# Patient Record
Sex: Male | Born: 1935 | Race: White | Hispanic: No | Smoking: Never smoker
Health system: Southern US, Community
[De-identification: ages and names within clinical notes are randomized; demographics above are authoritative.]

## PROBLEM LIST (undated history)

## (undated) DIAGNOSIS — L309 Dermatitis, unspecified: Secondary | ICD-10-CM

## (undated) DIAGNOSIS — I2510 Atherosclerotic heart disease of native coronary artery without angina pectoris: Secondary | ICD-10-CM

## (undated) DIAGNOSIS — N402 Nodular prostate without lower urinary tract symptoms: Secondary | ICD-10-CM

## (undated) DIAGNOSIS — K439 Ventral hernia without obstruction or gangrene: Secondary | ICD-10-CM

## (undated) DIAGNOSIS — K819 Cholecystitis, unspecified: Secondary | ICD-10-CM

## (undated) DIAGNOSIS — I10 Essential (primary) hypertension: Secondary | ICD-10-CM

## (undated) DIAGNOSIS — Z9861 Coronary angioplasty status: Secondary | ICD-10-CM

## (undated) DIAGNOSIS — E119 Type 2 diabetes mellitus without complications: Secondary | ICD-10-CM

## (undated) DIAGNOSIS — I38 Endocarditis, valve unspecified: Secondary | ICD-10-CM

## (undated) DIAGNOSIS — E785 Hyperlipidemia, unspecified: Secondary | ICD-10-CM

## (undated) DIAGNOSIS — I7090 Unspecified atherosclerosis: Secondary | ICD-10-CM

## (undated) HISTORY — DX: Nodular prostate without lower urinary tract symptoms: N40.2

## (undated) HISTORY — DX: Essential (primary) hypertension: I10

## (undated) HISTORY — DX: Coronary angioplasty status: Z98.61

## (undated) HISTORY — DX: Hyperlipidemia, unspecified: E78.5

## (undated) HISTORY — PX: APPENDECTOMY: SHX54

## (undated) HISTORY — DX: Endocarditis, valve unspecified: I38

## (undated) HISTORY — DX: Ventral hernia without obstruction or gangrene: K43.9

## (undated) HISTORY — DX: Type 2 diabetes mellitus without complications: E11.9

## (undated) HISTORY — PX: CORONARY ANGIOPLASTY WITH STENT PLACEMENT: SHX49

## (undated) HISTORY — DX: Cholecystitis, unspecified: K81.9

## (undated) HISTORY — DX: Dermatitis, unspecified: L30.9

## (undated) HISTORY — DX: Atherosclerotic heart disease of native coronary artery without angina pectoris: I25.10

## (undated) HISTORY — DX: Unspecified atherosclerosis: I70.90

---

## 2003-12-11 HISTORY — PX: CARDIAC CATHETERIZATION: SHX172

## 2004-11-09 HISTORY — PX: LEFT HEART CATH AND CORONARY ANGIOGRAPHY: CATH118249

## 2004-11-09 HISTORY — PX: CORONARY ANGIOPLASTY WITH STENT PLACEMENT: SHX49

## 2004-11-21 ENCOUNTER — Inpatient Hospital Stay (HOSPITAL_COMMUNITY): Admission: RE | Admit: 2004-11-21 | Discharge: 2004-11-24 | Payer: Self-pay | Admitting: Cardiovascular Disease

## 2008-06-06 ENCOUNTER — Inpatient Hospital Stay (HOSPITAL_COMMUNITY): Admission: EM | Admit: 2008-06-06 | Discharge: 2008-06-08 | Payer: Self-pay | Admitting: Emergency Medicine

## 2008-06-07 ENCOUNTER — Ambulatory Visit: Payer: Self-pay | Admitting: Infectious Diseases

## 2008-06-19 DIAGNOSIS — M533 Sacrococcygeal disorders, not elsewhere classified: Secondary | ICD-10-CM | POA: Insufficient documentation

## 2008-07-13 ENCOUNTER — Encounter: Payer: Self-pay | Admitting: Infectious Disease

## 2008-07-16 DIAGNOSIS — M791 Myalgia, unspecified site: Secondary | ICD-10-CM | POA: Insufficient documentation

## 2008-07-20 ENCOUNTER — Ambulatory Visit: Payer: Self-pay | Admitting: Infectious Disease

## 2008-07-20 ENCOUNTER — Other Ambulatory Visit: Admission: RE | Admit: 2008-07-20 | Discharge: 2008-07-20 | Payer: Self-pay | Admitting: Infectious Disease

## 2008-07-20 ENCOUNTER — Encounter: Payer: Self-pay | Admitting: Infectious Disease

## 2008-07-20 DIAGNOSIS — R21 Rash and other nonspecific skin eruption: Secondary | ICD-10-CM | POA: Insufficient documentation

## 2008-07-20 DIAGNOSIS — M064 Inflammatory polyarthropathy: Secondary | ICD-10-CM | POA: Insufficient documentation

## 2008-07-20 DIAGNOSIS — IMO0001 Reserved for inherently not codable concepts without codable children: Secondary | ICD-10-CM | POA: Insufficient documentation

## 2008-07-20 DIAGNOSIS — R509 Fever, unspecified: Secondary | ICD-10-CM

## 2008-07-21 ENCOUNTER — Encounter: Payer: Self-pay | Admitting: Infectious Disease

## 2008-07-22 ENCOUNTER — Ambulatory Visit (HOSPITAL_COMMUNITY): Admission: RE | Admit: 2008-07-22 | Discharge: 2008-07-22 | Payer: Self-pay | Admitting: Infectious Disease

## 2008-07-22 ENCOUNTER — Encounter (INDEPENDENT_AMBULATORY_CARE_PROVIDER_SITE_OTHER): Payer: Self-pay | Admitting: Licensed Clinical Social Worker

## 2008-07-26 ENCOUNTER — Telehealth: Payer: Self-pay | Admitting: Infectious Disease

## 2008-08-02 ENCOUNTER — Telehealth (INDEPENDENT_AMBULATORY_CARE_PROVIDER_SITE_OTHER): Payer: Self-pay | Admitting: *Deleted

## 2008-08-10 ENCOUNTER — Encounter: Payer: Self-pay | Admitting: Infectious Disease

## 2010-02-17 DIAGNOSIS — M129 Arthropathy, unspecified: Secondary | ICD-10-CM | POA: Insufficient documentation

## 2011-01-01 ENCOUNTER — Encounter: Payer: Self-pay | Admitting: Rheumatology

## 2011-01-07 LAB — CONVERTED CEMR LAB
Albumin ELP: 47.9 % — ABNORMAL LOW (ref 55.8–66.1)
Alpha-1-Globulin: 9.3 % — ABNORMAL HIGH (ref 2.9–4.9)
Alpha-2-Globulin: 17.5 % — ABNORMAL HIGH (ref 7.1–11.8)
Angiotensin 1 Converting Enzyme: 9 units/L (ref 9–67)
BUN: 14 mg/dL (ref 6–23)
Basophils Absolute: 0 10*3/uL (ref 0.0–0.1)
Basophils Relative: 0 % (ref 0–1)
Beta Globulin: 6 % (ref 4.7–7.2)
CO2: 26 meq/L (ref 19–32)
CRP: 10.2 mg/dL — ABNORMAL HIGH (ref <0.6)
Calcium: 8.7 mg/dL (ref 8.4–10.5)
Chlamydia, Swab/Urine, PCR: NEGATIVE
Chloride: 101 meq/L (ref 96–112)
Creatinine, Ser: 0.78 mg/dL (ref 0.40–1.50)
EBV NA IgG: 5.08 — ABNORMAL HIGH
EBV VCA IgG: 5.27 — ABNORMAL HIGH
EBV VCA IgM: 0.39
Eosinophils Absolute: 0.2 10*3/uL (ref 0.0–0.7)
Eosinophils Relative: 2 % (ref 0–5)
Ferritin: 818 ng/mL — ABNORMAL HIGH (ref 22–322)
GC Probe Amp, Urine: NEGATIVE
Gamma Globulin: 12.9 % (ref 11.1–18.8)
Glucose, Bld: 116 mg/dL — ABNORMAL HIGH (ref 70–99)
HCT: 34.4 % — ABNORMAL LOW (ref 39.0–52.0)
HCV Ab: NEGATIVE
Hemoglobin: 11.8 g/dL — ABNORMAL LOW (ref 13.0–17.0)
Hep A Total Ab: POSITIVE — AB
Hep B S Ab: NEGATIVE
Hepatitis B Surface Ag: NEGATIVE
LDH: 207 units/L (ref 94–250)
Lymphocytes Relative: 15 % (ref 12–46)
Lymphs Abs: 1.6 10*3/uL (ref 0.7–4.0)
MCHC: 34.3 g/dL (ref 30.0–36.0)
MCV: 85.1 fL (ref 78.0–100.0)
Monocytes Absolute: 1.4 10*3/uL — ABNORMAL HIGH (ref 0.1–1.0)
Monocytes Relative: 13 % — ABNORMAL HIGH (ref 3–12)
Neutro Abs: 7.4 10*3/uL (ref 1.7–7.7)
Neutrophil cytoplasmic antibodies,IgG,serum: <1:20 {titer}
Neutrophils Relative %: 70 % (ref 43–77)
Platelets: 270 10*3/uL (ref 150–400)
Potassium: 3.9 meq/L (ref 3.5–5.3)
RBC: 4.04 M/uL — ABNORMAL LOW (ref 4.22–5.81)
RDW: 13.4 % (ref 11.5–15.5)
Rubella: >500 intl units/mL — ABNORMAL HIGH
Sodium: 143 meq/L (ref 135–145)
Total CK: 26 units/L (ref 7–232)
Total Protein, Serum Electrophoresis: 6.7 g/dL (ref 6.0–8.3)
WBC: 10.5 10*3/uL (ref 4.0–10.5)

## 2011-04-24 NOTE — Discharge Summary (Signed)
Charles Sellers, PIECHOTA NO.:  [id number]   MEDICAL RECORD NO.:  [id number]          PATIENT TYPE:  INP   LOCATION:  1314                         FACILITY:  [facility]   PHYSICIAN:  Isidor Holts, M.D.  DATE OF BIRTH:  August 28, 1932   DATE OF ADMISSION:  06/06/2008  DATE OF DISCHARGE:  06/08/2008                               DISCHARGE SUMMARY   PMD:  Formerly Dr. Ocie Doyne, [place], [place].  The patient is  now unassigned.   DISCHARGE DIAGNOSES:  1. Postinfective polyarthropathy, etiology unclear.  2. Rash secondary to #1 above.  3. Hypertension.  4. Dyslipidemia.  5. History of coronary artery disease, status post percutaneous      transluminal coronary angioplasty and stent.  6. Degenerative joint disease/lumbar radiculopathy.  7. Peripheral neuropathy.   DISCHARGE MEDICATIONS:  1. Plavix 75 mg p.o. daily.  2. Fish on 1200 mg p.o. b.i.d.  3. Vitamin-50 Plus 1 p.o. daily.  4. Zocor 40 mg p.o. nightly.  5. Aspirin 81 mg p.o. daily.  6. Nexium 40 mg p.o. daily.  7. Toprol XL 12.5 mg p.o. daily.  8. Ramipril 7.5 mg p.o. daily.  9. NitroQuick 0.4 mg sublingually p.r.n. every 5 minutes.  10.Motrin 400 mg p.o. t.i.d. with food for 1 week, then p.r.n. t.i.d.  11.Prednisone 40 mg p.o. daily for 10 days, then 30 mg p.o. daily for      10 days, then 20 mg p.o. daily for 10 days, then 10 mg p.o. daily      for 10 days, then 5 mg p.o. daily for 5 days, then stop.   PROCEDURES:  Chest x-ray dated June 06, 2008.  This showed mild  cardiomegaly, no active disease.   CONSULTATIONS:  Dr. Lina Sayre, infectious diseases specialist.   ADMISSION HISTORY:  As in H&P notes of June 06, 2008.  However, in  brief, this is a 77 year old male, with known history of coronary artery  disease status post PTCA/sent 2009, hypertension, dyslipidemia,  DJD/lumbar radiculopathy, peripheral neuropathy, presenting with  polyarthritic pain and increasing difficulty in ambulation  for several  weeks, as well as a blotchy salmon-pink, rash on volar aspect of his  feet, for the past 3 days.  Reportedly he had a tick bite in late May  2009, developed flu-like symptoms in the first week of June 2009, for  which he was given a 2-day course of Levaquin, which he had to stop  because of severe joint pains.  He subsequently underwent a 10-day  course of Doxycycline, prescribed by his primary MD.  Because of  unremitting symptoms, he had seen Dr. Leavy Cella, infectious disease  special[place], and subsequently came to the  emergency department at [facility] June 06, 2008.  He was  therefore admitted for further evaluation, investigation and management.   CLINICAL COURSE:  1. Postinfective polyarthropathy.  For details of presentation, refer      to admission history above.  The patient underwent rheumatologic      workup.  ESR was found to be unimpressive at 48 mm/hr.  Rheumatoid  factor was negative.  Total CK was 20.  TSH was normal at 0.211.      [facility] spotted fever IgM was negative as well as Borrelia      burgdorferi IgM/IgG.  HLA B-27 has been ordered; however, at the      time of this dictation on June 08, 2008, the report was still      pending.  Chest x-ray was unremarkable for acute disease.      Urinalysis was negative.  Blood cultures were done, however      remained negative.  The patient was commenced on NSAID treatment      with Motrin and by June 07, 2008, joint pains had resolved.  As a      matter of fact, by June 08, 2008, the patient is fully ambulant and      very keen to be discharged.  Infectious diseases consultation was      kindly provided by Dr. 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 daily and tapering off over a       period of approximately 6 weeks with continued NSAID treatment.  We      had a discussion with Dr. Orson Aloe, infectious disease      specialist at [facility], [facility], by telephone.  He kindly      faxed over the results of his investigations which were ordered on      June 02, 2008, and these were essentially the same ones that were      done in the hospital here and results were similar, i.e., negative.   1. Hypertension.  The patient remained normotensive during the course      of his hospitalization, on pre-admission antihypertensive      medications.   1. Dyslipidemia.  The patient's lipid profile was as follows:  Total      cholesterol 123, triglycerides 30, HDL 39, LDL 78.  This is      considered an excellent lipid profile.  No changes have been made      to his medications.   1. History of degenerative joint disease/lumbar radiculopathy.  There      were no problems referable to this, during the course of his      hospitalization.   DISPOSITION:  The patient was on June 08, 2008, considered sufficiently  clinically recovered and stable to be discharged.  He was therefore  discharged accordingly.  He is recommended to return to regular duties  on June 14, 2008.   DIET:  Heart-healthy.   ACTIVITY:  As tolerated.  Recommended to increase activity slowly.   FOLLOW-UP INSTRUCTIONS:  The patient has recently terminated his  relationship with his primary MD., Dr. Ocie Doyne of [place], [place], and is in the process of establishing a new primary MD;  however, he does  have a cardiologist, Dr. Alanda Amass, who he will follow up with, until he  establishes a new primary MD.  He has been recommended to see Dr.  Alanda Amass in 1-2 weeks' time and has been instructed to call for an  appointment.  He has verbalized understanding.      Isidor Holts, M.D.  Electronically Signed     CO/MEDQ  D:  06/08/2008  T:  06/08/2008  Job:  [id number]   cc:   Pearletha Furl.  Alanda Amass, M.D.  Fax: [phone]   Alan Mulder, M.D.  Fax: [phone]   Fransisco Hertz, M.D.  Fax: [phone]

## 2011-04-24 NOTE — H&P (Signed)
Charles Sellers, [name] NO.:  [id number]   MEDICAL RECORD NO.:  [id number]          PATIENT TYPE:  EMS   LOCATION:  ED                           FACILITY:  [facility]   PHYSICIAN:  Isidor Holts, M.D.  DATE OF BIRTH:  30-Jun-1932   DATE OF ADMISSION:  06/06/2008  DATE OF DISCHARGE:                              HISTORY & PHYSICAL   PRIMARY MEDICAL DOCTOR:  Dr. Patrecia Pace, [place], [place].  The  patient is now unassigned.   CHIEF COMPLAINT:  Unwell since the first week of June 2009, with  generalized joint pains, increasing difficulty in ambulation, a blotchy  rash on both feet for the past 3 days, tick bite in late May 2009.   HISTORY OF PRESENT ILLNESS:  This is a 77 year old male. For past  medical history, see below.  History is essentially as above.  According  to spouse, who accompanied the patient to the emergency department, she  found a tick on this patient in the last 2 weeks of may.  In the first  week of June, the patient developed a cold.  He saw his primary MD on  May 14, 2008, and was prescribed a one week course of Levaquin, but this  was discontinued two days later, secondary to severe generalized joint  pains.  Five days later, he still had aching pains, mainly in the  wrists, elbows, shoulders and hips, although sore throat had subsided.  He saw his primary MD again, who gave him an injection of steroid  repeated this after four days, and then prescribed a five day course of  Tamiflu as well as a 7-10 day course of Doxycycline, all of which he has  completed.  The patient has had a low-grade pyrexia of between 99  degrees to 100 degrees Fahrenheit.  Joint pains have persisted.  He has  taken to ambulating with a cane, with increasing difficulty.  On May 21, 2008, he saw his primary MD again, and was given another steroid  injection.  This time,  he was commenced on oral prednisone taper, which  he has completed.  On June 6, 24, 2009, the  patient saw Dr. Leavy Cella, an  infectious disease specialist at [facility], [facility] who did  some lab tests, reports of which are still pending.  On June 04, 2008,  the patient noticed a blotchy salmon pink rash on the soles of both  feet, which were nonpruritic, nontender.  Last night he had a  particularly restless night, was unable to sleep and therefore came to  the emergency room today.  The patient denies any sick contacts or  recent travel.  Denies abdominal pain.  Denies vomiting, diarrhea or  cough.   PAST MEDICAL HISTORY:  1. Coronary artery disease, status post cardiac catheterization      November 21, 2008, with PTCA and stent proximal 99% RCA and distal      85% PL; status post cardiac catheterization November 23, 2008, by      Dr. Alanda Amass, cutting balloon LAD and surface stent across  diagonal with PTCA of ostial diagonal.  2. Hypertension.  3. Dyslipidemia.  4. Degenerative joint disease/lumbar radiculopathy.  5. Peripheral neuropathy.   MEDICATION:  1. Plavix 75 mg p.o. daily.  2. Fish oil 1200 mg p.o. b.i.d.  3. Vitamin-50 plus one p.o. daily.  4. Zocor 40 mg p.o. daily at bedtime.  5. Aspirin 81 mg p.o. daily.  6. Nexium 40 mg p.o. daily.  7. Toprol XL 12.5 mg p.o. daily.  8. Ramipril 7.5 mg p.o. daily.  9. NitroQuick 0.4 mg sublingually p.r.n.   ALLERGIES:  1. PENICILLIN.  2. NIASPAN.  3. LEVAQUIN.   REVIEW OF SYSTEMS:  As per HPI and chief complaint, otherwise negative.   SOCIAL HISTORY:  The patient is retired, used to work in Designer, fashion/clothing and  mobile homes.  He is married, has four children.  Nonsmoker, nondrinker.  Has no history of drug abuse.   FAMILY HISTORY:  Noncontributory.   PHYSICAL EXAMINATION:  VITAL SIGNS:  Temperature 98.2, pulse 73 per  minute and regular, respiratory rate 23, BP 136/83 mmHg, pulse oximeter  97% on room air.  The patient did not appear to be in obvious acute  distress, at the time of this evaluation.  As a  matter of fact, he was  sitting up on a couch in the emergency department eating a burger.  He  is communicative.  Not short of breath at rest.  HEENT:  No clinical pallor, no jaundice.  No conjunctival injection.  Throat is clear.  NECK:  Supple.  JVP not seen.  No palpable lymphadenopathy.  No palpable  goiter.  CHEST:  Clinically clear to auscultation.  No wheezes or crackles.  HEART:  Heart sounds 1 and 2 heard, normal, regular, no murmurs.  ABDOMEN:  Abdomen is full, soft, nontender.  Multiple Levester Fresh  spots are noted on skin of anterior abdomen.  No palpable organomegaly  or palpable masses.  Normal bowel sounds.  LOWER EXTREMITIES:  No pitting edema.  Palpable peripheral pulses.  The  patient has a blotchy salmon pink discoloration soles of both feet with  serpiginous margins.  MUSCULOSKELETAL:  The patient has nodular osteoarthritic changes.  Has  full range of motion all joints.  No obvious active synovitis.  CENTRAL NERVOUS SYSTEM:  No focal neurologic deficit on gross  examination.   INVESTIGATION:  CBC - WBC 11.9, neutrophils 82%, hemoglobin 13.1,  hematocrit 38.2, platelets 191.  Electrolytes are 133, potassium 4.2,  chloride 104, CO2 27, BUN 15, creatinine 0.54, glucose 111.  AST 15, ALT  27, alkaline phosphatase 51.  Urinalysis is negative.  ESR 48 mm per  hour.   ASSESSMENT AND PLAN:  1. Polyarthropathy associated with rash and low-grade pyrexia.      Etiology is uncertain.  We shall admit the patient and commence him      on NSAIDs, do septic workup with blood cultures and chest x-ray.      Do [facility] Spotted Fever and Ehrlichia titers.  ESR is      mildly elevated, although this is nonspecific.  We shall do      rheumatoid factor, antinuclear antibody, anti-ds DNA, uric acid      levels, and CK level, as the patient is on a statin.  We shall hold      Zocor for now.  Seek infectious diseases specialist opinion.   1. Hypertension.  This is  controlled.  We shall continue pre-admission      antihypertensive medications.  1. Dyslipidemia.  We shall check lipid profile, as well as TSH.   Note:  The patient would likely benefit from PT/OT.   Further management will depend on clinical course.      Isidor Holts, M.D.  Electronically Signed     CO/MEDQ  D:  06/06/2008  T:  06/06/2008  Job:  [id number]

## 2011-04-27 NOTE — Discharge Summary (Signed)
NAMESHAHEER, Charles Sellers NO.:  [id number]   MEDICAL RECORD NO.:  [id number]          PATIENT TYPE:  OIB   LOCATION:  6533                         FACILITY:  [facility]   PHYSICIAN:  Eber Hong, P.A.DATE OF BIRTH:  11/20/1932   DATE OF ADMISSION:  11/21/2004  DATE OF DISCHARGE:  11/24/2004                                 DISCHARGE SUMMARY   ADMISSION DIAGNOSES:  1.  Chest pain with abnormal Cardiolite.  2.  Hypertension.  3.  Hyperlipidemia.   DISCHARGE DIAGNOSES:  1.  Atypical chest pain with 2 vessel coronary artery disease.  2.  Hypertension.  3.  Hyperlipidemia.   PROCEDURE:  1.  Cardiac catheterization November 21, 2004 with percutaneous transluminal      coronary angioplasty and stenting of a proximal 99% right coronary      artery and a distal 85% PL.  2.  Cardiac catheterization PCI and cutting balloon of the left anterior      descending Cypher stent across the second diagonal and percutaneous      transluminal coronary angioplasty of the ostial diagonal 2 artery      November 23, 2004, Dr. Alanda Amass.   HISTORY OF PRESENT ILLNESS:  The patient is a 77 year old white male who had  no medical problems before but he had 3 episodes of weakness and some  abnormal feelings in his arms. He denied shortness of breath, nausea, or  palpitation. This lead his primary care physician, Dr. Debera Lat to do a  Cardiolite study, which was strongly positive. He was subsequently referred  to Dr. Susa Griffins for evaluation and cardiac catheterization. For  further history and physical, please see dictated note.   PAST MEDICAL HISTORY:  Includes hypercholesterolemia, hypertension, lumbar  radiculopathy, peripheral neuropathy.   SOCIAL HISTORY:  He is married. He has 3 children with 1 adopted. He does  not use alcohol or tobacco.   ALLERGIES:  PENICILLIN.   ADMISSION MEDICATIONS:  Atenolol 25 mg daily, nitroglycerin p.r.n., Zocor 40  mg daily, and aspirin  325 mg daily.   HOSPITAL COURSE:  The patient was admitted and taken to the catheterization  lab on November 21, 2004. At that time, he was found to have multi-vessel  disease. The left anterior descending showed an 85% stenosis over the  diagonal 2 and distal to the diagonal 2 branch. There was a 40% followed by  a 50% stenosis of the first diagonal. The left circumflex showed 100%  occlusion distally with what looks like good collateral flow. The right  coronary artery showed 20% to 30% proximal stenosis. This was followed by a  proximal one third 80%, 70% and 99% sequential stenosis. The patient also  had some moderate disease distally 30% and 40%. He also had an 85% stenosis  of what looks like the PL branch. The patient underwent PCA and then  stenting of his right coronary artery and PL on November 21, 2004. He  tolerated the procedure well. The patient's ejection fraction was calculated  at 55%. He had +1 mitral regurgitation. After completion of the study, it  was  Dr. Kandis Cocking opinion that he should undergo stenting of his left  anterior descending. This was done on November 23, 2004. He had a 90% left  anterior descending, which was decreased to 20% and a stent was placed. This  goes across the diagonal and the diagonal ostial stent was dilated. The  patient tolerated both procedures well. Following a.m., he was stable. He  was mobilized and was subsequently discharged to home.   DISCHARGE MEDICATIONS:  1.  Atenolol 50 mg daily. This was increased from his admission dose of 25      daily.  2.  He was to continue Zocor or Lipitor 20 mg daily.  3.  Aspirin 81 mg daily.  4.  He is given a prescription for p.r.n. sublingual nitroglycerin.  5.  Plavix 75 mg daily.   FOLLOW UP:  He will return to see Dr. Alanda Amass in 2 to 3 weeks. He is to  followup with Dr. Debera Lat, who will follow his lipids and medical  management.   LABORATORY DATA:  Discharge labs shows a hemoglobin of  12.3, hematocrit of  35.7, white count of 7.7, platelets 156,000. Electrolytes are normal. BUN is  6. Creatinine is 0.8. Glucose is 114.   CONDITION ON DISCHARGE:  Improved.   ADDENDUM:  He had a small amount of ecchymosis at the catheterization site  but overall looked good. He was instructed to watch for swelling in the  groin and to call if he has any problems.       WDJ/MEDQ  D:  11/24/2004  T:  11/26/2004  Job:  [id number]   cc:   Dr. Debera Lat

## 2011-04-27 NOTE — Cardiovascular Report (Signed)
Charles Sellers, Charles Sellers                 ACCOUNT NO.:  [id number]   MEDICAL RECORD NO.:  [id number]          PATIENT TYPE:  OIB   LOCATION:  6533                         FACILITY:  [facility]   PHYSICIAN:  Richard A. Alanda Amass, M.D.DATE OF BIRTH:  [birth year]   DATE OF PROCEDURE:  11/23/2004  DATE OF DISCHARGE:                              CARDIAC CATHETERIZATION   PROCEDURES:  1.  Retrograde central aortic catheterization.  2.  Selective left coronary angiography.  3.  Pre and post intracoronary nitroglycerin administration.  4.  Aggrastat bolus plus infusion.  5.  Weight-adjusted heparin.  6.  Intravascular ultrasound interrogation of left anterior descending with      Sci-Med Atlantis Pro intravascular ultrasound system.  7.  Cutting balloon atherectomy of high-grade left anterior descending      stenosis post second diagonal.  8.  Left anterior descending Cypher stenting across second diagonal.  9.  Second diagonal ostial percutaneous transluminal coronary angiography      through stent struts.   DESCRIPTION OF PROCEDURE:  The patient was brought to the second floor CP  laboratory in a post absorptive state after 5 mg of Valium p.o.  premedication.  Heparin was on hold.  The patient was continued on aspirin  and Plavix.  The left groin was prepped and draped in the usual manner.  One  percent Xylocaine was used focal local anesthesia.  The LCFA was entered  with a single anterior puncture using an 18 thin-wall needle and a 6 French  short Diag sidearm sheath was inserted without difficulty.  Diagnostic left  coronary angiography was done with 6 French 4 cm taper JL4 Cordis guiding  catheter pre and post IC nitroglycerin administration.  The patient was  given 4000 units of weight adjusted heparin, given an extra 75 mg of Plavix  and 325 mg of aspirin in the laboratory and during the procedure received a  total of 3 mg of Versed in divided doses for sedation and 400 mcg more of IC  nitroglycerin.  Multiple orthogonal projections were reviewed.  The lesion  was crossed with a 0.014 inch Asahi soft guide wire which was free in the  distal LAD.  The lesion was then IVUSed with an Atlantis Pro IVUS over the  guide wire.  The distal reference vessel beyond the lesion was approximately  3.1 mm in diameter.  The lesion itself was very tight eccentrically calcific  and measured 1.2 x 1.4 mm in diameter.  The proximal reference vessel  measured 4.1 x 4.1 mm in diameter.  The side branch was involved in the flap  extending into the diagonal and there was plaque-like disease just proximal  to the DX2.  The IVUS was then removed.  The LAD was dilated with a 3.0/10  atherectomy Sci-Med cutting balloon at 5-40 and 8-40 throughout the fairly  focal 6 mm LAD lesion and proximal to the diagonal takeoff across the DX2.  There was some plaque shift to the diagonal, but it remained patent.  The  LAD was then stented with a 3.0/13 Cypher DS Cortis stent which was deployed  at 12-40 and post dilated at 14-30.  There was plaque shift across the DX2  so the stent balloon was removed and the DX2 was crossed with a second wire,  which was a 0.014 inch Asahi light guide wire.  Using double wire technique,  the LAD was then dilated with an upgrade 3.5/8 Guidant Power Sail balloon at  12-34 in the proximal mid portion and 7-30 in the mid and distal portion.  The balloon was then removed.  The ostia of the LAD had 90% narrowing  related to plaque shift in the ostia and this was crossed over the  previously placed wire through the LAD stent struts with a 2.5/8 Guidant  Power Sail balloon.  This was positioned across the stenosis through the  struts and dilatation was done at 10-44 and repeat at 10-24 seconds.  The  balloon was pulled back.  Final injections showed excellent result in the  LAD with 85% LAD stenosis beyond DX2 reduced to 0 and the plaque shift to  the ostial LAD of 90% (40-50%  narrowing prior to procedure) was reduced to  less than 20% with good TIMI-3 flow to the large trifurcating DX2 and to the  distal LAD coursing to the apex.  The dilatation system was removed.  Final  ACT with double Aggrastat bolus plus infusion going was 221 seconds.  The  patient was transferred to the holding area for postoperative care in stable  condition.  He tolerated the procedure well.   DISCUSSION:  Please refer to catheterization report and RCA PCI reports of  November 21, 2004.   This 77 year old gentleman has recent onset angina characterized  predominantly by arm discomfort and vague substernal burning.  He was  evaluated by Dr. Patrecia Pace and found to have a strongly positive Cardiolite  prompting referral for evaluation and catheterization.  He was begun on  medications and also has a history of hypertension and early onset AODM.   On November 21, 2004, he underwent Cypher stenting with a 3.5 upgraded with  a 4 balloon of a high-grade 99% mid RCA culprit lesion.  He also underwent  stenting of the high-grade PLA lesion with a 2.5/13 Cypher stent.  He  tolerated that well and was brought back today for his staged procedure of a  known high-grade 85% LAD DX2 bifurcation lesion.   He has had successful Cypher stenting of the LAD and side branch dilatation  of the large DX2 with excellent angiographic result.   We would recommended continued medical therapy of his associated problems,  including AODM, hyperlipidemia and hypertension (nonsmoker).  Medical  followup with Alan Mulder, M.D.   CATHETERIZATION DIAGNOSES:  1.  Atherosclerotic heart disease with recent onset of angina, atypical      presentation.  2.  Early positive Cardiolite by Dr. Patrecia Pace with positive stress test.  3.  Systemic hypertension.  4.  Hyperlipidemia.  5.  Recent onset of possible borderline diabetes.  6.  Hyperlipidemia. 7.  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  8.  Successful drug eluting stenting of mid right coronary artery culprit      lesion and right coronary artery and posterolateral artery on November 21, 2004.  9.  Successful staged percutaneous transluminal coronary angiography of high-      grade left anterior descending disease with Cypher stent and side branch      second  diagonal.  10. Systemic hypertension with normal renal arteries.      Rich   RAW/MEDQ  D:  11/23/2004  T:  11/23/2004  Job:  [id number]   cc:   Alan Mulder, M.D.  [address]  [place]  [place] [address]  Fax: [phone]   Darlin Priestly, MD  [phone] [address]  [place]  [place] [address]  Fax: [phone]   [facility]

## 2011-04-27 NOTE — Cardiovascular Report (Signed)
NAMETAJAI, IHDE NO.:  [id number]   MEDICAL RECORD NO.:  [id number]          PATIENT TYPE:  OIB   LOCATION:  2899                         FACILITY:  [facility]   PHYSICIAN:  Richard A. Alanda Amass, M.D.DATE OF BIRTH:  30-Oct-1932   DATE OF PROCEDURE:  11/21/2004  DATE OF DISCHARGE:                              CARDIAC CATHETERIZATION   PROCEDURE:  Percutaneous coronary intervention with culprit lesion, high-  grade mid-right coronary artery, percutaneous transluminal coronary  angiography and subsequent DES CYPHER stent, and re-dilatation with a 4.0 MM  balloon, DES stent mid-distal posterior lateral artery, percutaneous  transluminal coronary angiography of mid-posterior lateral artery, weight-  adjusted heparin, Aggrastat bolus plus infusion, Plavix 600 mg, additional  aspirin.   CARDIOLOGIST:  Richard A. Alanda Amass, M.D.   INDICATIONS FOR PROCEDURE:  Please refer to the complete cardiac  catheterization report earlier on November 21, 2004.  It was elected to proceed with a PCI and an informed consent was obtained  from the patient and from his family.  The diagnostic cardiac  catheterization in the setting of a recent onset of angina, a strongly  positive Cardiolite for inferior ischemia by the patient's astute primary  physician, Dr. Alan Mulder.  Angiography revealed a 99% sub-total  concentric mid-RCA stenosis with surrounding 70% to 80% and high-grade 75%  and 85% tandem PLA lesions, with no other significant dominant right RCA  stenosis.  In addition, he had an 85% hypo-dense LAD lesion beyond the  diagonal-II, a 50% diagonal-I and a total occlusion with recannulation and  collaterals of the mid-distal circumflex obtuse marginal.  Minimal inferior  wall motion abnormality, otherwise well-preserved LV function with an  ejection fraction of greater than 55%, and no significant mitral  regurgitation.   DESCRIPTION OF PROCEDURE:  The patient was given  a double bolus of Aggrastat  plus infusion.  He was given 600 mg of Plavix in the laboratory, along with  4 additional baby aspirin, and 20 mg of IV Pepcid.  The weight-adjusted  heparin was used, and the ACT was monitored with a total of 6500 units of  heparin given throughout the procedure.  IC nitroglycerin 200 mcg x2 was  given in the right coronary artery.  The right coronary artery was intubated  with a 6-French JR4 Cordis guiding catheter.  The high-grade mid-stenosis  was traversed with a 0.014 inch size soft wire.  The wire was then able to  cross the high-grade mid and distal PLA lesions.  A 2.5/12 mm Cymed Maverick  balloon was used to pre-dilate the mid at 8-30.  The distal and mid-PLA  lesions were then crossed under fluoroscopic control and dilated at 7-30, 9-  29, 10-31, and 9-36.  There was residual stenosis in the distal PLA lesion  that was about 75% with elastic recoil and eccentric, so doing careful  balloon exchange, we elected to stent this with a 2.5/13 Cordis DES CYPHER  stent.  This covered the lesion and was deployed at 12-20, and post-dilated  at 12-11.  Excellent angiographic result of the distal and mid-PLA lesions.  Using an exchange technique, the mid-RCA was then stented with a 3.5/18 mm  CYPHER stent, deployed at 14-33 and post-dilated at 10-32.  The dilatation  system was removed.  The balloon up-graded to a 4.0/15.0 Maverick balloon  within the stent and dilated at 10-32.  The balloon was pulled back and  final injections post-IC nitroglycerin, demonstrated the mid-RCA lesion  reduced from 99% to 0%, with  no dissection and full coverage, intact RV  marginal branch.  The mid-PLA was reduced from 75% to 20% (POBA).  The mid-  distal PLA was reduced from 85% the patient 0%.  There was good TIMI-3 flow  throughout the vessel.  The dilatation system was removed.  The patient  received 2 mg of Nubain during the procedure and tolerated it well.  The  blood  pressure remained stable at approximately  140-150 mmHg on IV  nitroglycerin.  The patient was transferred to the holding area for postoperative care and  eventual sheath removal.  I will continue him on IIbIIIa inhibitor for  approximately 18 hours, aspirin and Plavix.  He is a candidate for staged LAD intervention, and depending upon his  clinical course and outcome, we will plan for that probably in 48 hours or  greater, if he remains stable.   DIAGNOSES:  1.  Arteriosclerotic heart disease, with recent onset of angina, with early      positive Cardiolite inferolateral leads.  2.  Associated high-grade proximal-mid-left anterior descending coronary      artery stenosis.  3.  Old total distal circumflex occlusion with collaterals to distal obtuse      marginal.  4.  Well-preserved left ventricular function.  5.  Hypertension, with normal renal arteries.  6.  Early adult onset diabetes mellitus.  7.  Hyperlipidemia.      Rich   RAW/MEDQ  D:  11/21/2004  T:  11/21/2004  Job:  [id number]   cc:   Alan Mulder, M.D.  [address]  [place]  [place] [address]  Fax: [phone]   CP Laboratory - [facility]   Darlin Priestly, MD  [phone] [address]  [place]  [place] [address]  Fax: [phone]

## 2011-04-27 NOTE — Cardiovascular Report (Signed)
NAMEARIC, JOST NO.:  [id number]   MEDICAL RECORD NO.:  [id number]          PATIENT TYPE:  OIB   LOCATION:  2899                         FACILITY:  [facility]   PHYSICIAN:  Richard A. Alanda Amass, M.D.DATE OF BIRTH:  01-21-1932   DATE OF PROCEDURE:  11/21/2004  DATE OF DISCHARGE:                              CARDIAC CATHETERIZATION   PROCEDURE:  Retrograde aortic catheterization, selective coronary  angiography, with pre and post-intercoronary nitroglycerin administration,  left ventricular angiogram in the right anterior oblique, left anterior  oblique projections, sub-selective left internal mammary artery and right  internal mammary artery, abdominal aortic angiogram, mid-stream PO  projection.   CARDIOLOGIST:  Richard A. Alanda Amass, M.D.   DESCRIPTION OF PROCEDURE:  The patient is brought to the second floor CP  laboratory in the post-absorptive state.  After 5 mg of Valium __________  medication and preoperative laboratory, on 11/21/2004 revealed normal CBC  and differential, normal platelet count, coags, BMP, and baseline  electrocardiogram.  The right groin was prepped and draped in the usual  manner.  Xylocaine 1% was used for local anesthesia.  An informed consent  was obtained from the patient to proceed with a diagnostic procedure.  A coronary angiography was done with a 6-French 4 cm taper, Cordis  diagnostic catheters through a 6-French short Daig side-arm sheath that was  placed in a CFRA under 1% Xylocaine anesthesia via the modified Seldinger  technique with a single anterior puncture.  Nitroglycerin 200 mcg was given  through the left coronary and right coronary catheter, with repeat  injections obtained.  Sub-selective LIMA and RIMA were done with the right  coronary catheter.  Left ventricular angiogram was done in the RAO and LAO  projection at 25 mL, 14 mL per second, and 20 mL at 12 mL per second  respectively.  Pullback pressure in the CA  was obtained and showed no  gradient across the aortic valve.  IV nitroglycerin was begun because of  systemic hypertension.  An electrocardiogram remained stable, with no  significant abnormalities.  The patient was given 2 mg of Versed for  sedation at the beginning of the procedure.  An abdominal aortic angiogram  was done in the mid-stream, PA projection at 25 mL, 20 mL per second with  bilaterally.  The catheter was removed.  The patient tolerated the  diagnostic procedure well.   PRESSURES:  LV:  195/0.  LVEDP:  18 mmHg.  CA:  195/90 mmHg.  There is no gradient across the aortic valve on catheter pullback.  Systolic pressure came down to 160-165 mmHg post IC and IV nitroglycerin.   LEFT VENTRICULAR ANGIOGRAM:  The left ventricular angiogram in the RAO and  LAO projection reveals hypokinesis of the mid-inferior wall, estimated  ejection fraction of approximately 55%, and no mitral regurgitation present.   ABDOMINAL AORTIC ANGIOGRAM:  The abdominal aortic angiogram in the mid-  stream PA projection revealed single normal renal arteries bilaterally.  A  very smooth infrarenal abdominal aorta and common and external iliac  systems.  The hypogastrics were intact bilaterally.  There was normal  SFA/profunda junction bilaterally.  The iliacs were quite tortuous but not  aneurysmal, and there was no aneurysm present.  The right brachiocephalic was extremely tortuous.  The RIMA was intact.  The  left subclavian had minor irregularities and the LIMA was intact.  Both  vertebrals were antegrade.  The main fluoroscopy showed 2+ left and right coronary calcification.   RESULTS:  1.  MAIN LEFT CORONARY ARTERY:  The main left coronary artery was long and      normal.  2.  LEFT ANTERIOR DESCENDING CORONARY ARTERY:  The left anterior descending      coronary artery had 85% hypo-dense concentric stenosis at the junction      of the proximal and mid-portion after the second diagonal branch,  which      was of moderate size.  The remainder of the LAD coursed to the apex of      the heart, was slightly under-filled and had no significant stenosis.      The first diagonal arose before SP-I and had 50% ostial and 50% mid-      lesion in the bifurcating vessel, with good flow.  The second diagonal      branch was of moderate size before the LAD stenosis, bifurcated, and had      no significant stenosis.  There was a moderately-large optional diagonal      that bifurcated with no significant stenosis.  3.  CIRCUMFLEX CORONARY ARTERY:  The circumflex proper gave off a small      atrial and OM-I branch, followed by a small PABG branch.  There was      total occlusion of the mid-circumflex before the distal marginal branch.      There was some antegrade filling via recannulation and also retrograde      filling from the OD to the distal circumflex, which was moderately large      and filled well.  4.  RIGHT CORONARY ARTERY:  The right coronary artery was a large dominant      vessel.  There was 20%-30% narrowing in the proximal third at the bend,      followed by a large bifurcating RV branch.  Just above the midportion      segmentally there was high-grade 70% stenosis with concentric narrowing      down to 99% in the distal portion of the segmental mid-lesion.  There      was TIMI-3 flow in the distal vessel.  There was another 40% and 30% of      the mid and distal third of the RCA, with good residual lumen.  The POA      had 75% and 85% high-grade stenoses in the proximal and mid-portion.      The inferior bifurcation branch had no significant stenosis.  The PDA      bifurcated and had no significant stenosis.   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  He is a nonsmoker and has been  very active.  He has a history of hypertension,  possible pre-diabetes, with  mild elevation of blood sugars, and hyperlipidemia.  He recently had several  episodes of vague substernal pressure with sweating and weakness, and his  last episode occurred while chopping wood over the last week.  He underwent  a treadmill exercise test and a Cardiolite scanning by Dr.  Alan Mulder on November 14, 2004, and November 15, 2004.  He had severe stress-  induced ischemia of the inferolateral wall and septum, with an ejection  fraction of 39%.  He was referred through the courtesy of Dr. Patrecia Pace for  further evaluation.  The patient was recently started on Zocor, atenolol and continued on baby  aspirin.  He has high-grade stenosis of the dominant mid-RCA which appears  amenable to PCI.  He also has disease of the distal PLA which is probably  amenable as well.  This appears to be his culprit lesion at present.  If  these are successful with good result, he would be a candidate for a staged  intervention of his LAD lesion beyond the second diagonal.  He has good  preservation of LV function, and no contraindications to antiplatelet  therapy.   CATHETERIZATION DIAGNOSES:  1.  Arteriosclerotic heart disease - recent onset angina of approximately      one to four weeks.  2.  Strongly positive Cardiolite on November 14, 2004, November 15, 2004, Dr.      Patrecia Pace.  3.  Hypertension.  4.  Hyperlipidemia.  5.  Lumbar radiculopathy with history of peripheral neuropathy.  6.  Systemic hypertension with normal renal arteries and renal function.  7.  Possible early adult onset diabetes mellitus.      Rich   RAW/MEDQ  D:  11/21/2004  T:  11/21/2004  Job:  [id number]   cc:   Alan Mulder, M.D.  [address]  [place]  [place] [address]  Fax: [phone]   Darlin Priestly, MD  [phone] [address]  [place]  [place] [address]  Fax: [phone]

## 2011-09-06 LAB — BASIC METABOLIC PANEL
BUN: 19
CO2: 29
Calcium: 8.3 — ABNORMAL LOW
Calcium: 8.4
Creatinine, Ser: 0.71
GFR calc Af Amer: >60
GFR calc non Af Amer: >60
Glucose, Bld: 131 — ABNORMAL HIGH
Sodium: 135

## 2011-09-06 LAB — CBC
HCT: 38.2 — ABNORMAL LOW
Hemoglobin: 12.6 — ABNORMAL LOW
Hemoglobin: 13.1
MCHC: 33.8
MCHC: 34.1
MCV: 87.5
Platelets: 191
RBC: 4.37
RDW: 12.8
RDW: 13.2
WBC: 11.9 — ABNORMAL HIGH

## 2011-09-06 LAB — COMPREHENSIVE METABOLIC PANEL
ALT: 27
AST: 15
Albumin: 2.7 — ABNORMAL LOW
Alkaline Phosphatase: 51
BUN: 15
CO2: 27
Calcium: 8.4
Chloride: 104
Creatinine, Ser: 0.54
GFR calc Af Amer: >60
GFR calc non Af Amer: >60
Glucose, Bld: 111 — ABNORMAL HIGH
Potassium: 4.2
Sodium: 138
Total Bilirubin: 1.1
Total Protein: 5.9 — ABNORMAL LOW

## 2011-09-06 LAB — LIPID PANEL
Cholesterol: 123
HDL: 39 — ABNORMAL LOW
LDL Cholesterol: 78
Total CHOL/HDL Ratio: 3.2
Triglycerides: 30
VLDL: 6

## 2011-09-06 LAB — CULTURE, BLOOD (ROUTINE X 2)
Culture: NO GROWTH
Culture: NO GROWTH

## 2011-09-06 LAB — EHRLICHIA ANTIBODY PANEL: E chaffeensis (HGE) Ab, IgG: 1:64 {titer} — ABNORMAL HIGH

## 2011-09-06 LAB — B. BURGDORFI ANTIBODIES: B burgdorferi Ab IgG+IgM: 0.17

## 2011-09-06 LAB — DIFFERENTIAL
Basophils Absolute: 0
Basophils Relative: 0
Eosinophils Absolute: 0.1
Eosinophils Relative: 1
Lymphocytes Relative: 9 — ABNORMAL LOW
Lymphs Abs: 1.1
Monocytes Absolute: 0.9
Monocytes Relative: 7
Neutro Abs: 9.8 — ABNORMAL HIGH
Neutrophils Relative %: 82 — ABNORMAL HIGH

## 2011-09-06 LAB — CK: Total CK: 20

## 2011-09-06 LAB — ANTI-DNA ANTIBODY, DOUBLE-STRANDED: ds DNA Ab: 2 IU/mL (ref <5)

## 2011-09-06 LAB — URINALYSIS, ROUTINE W REFLEX MICROSCOPIC
Glucose, UA: NEGATIVE
Hgb urine dipstick: NEGATIVE
Ketones, ur: NEGATIVE
Nitrite: NEGATIVE
Protein, ur: NEGATIVE
Specific Gravity, Urine: 1.026
Urobilinogen, UA: 1
pH: 6.5

## 2011-09-06 LAB — TSH: TSH: 0.211 — ABNORMAL LOW

## 2011-09-06 LAB — SEDIMENTATION RATE: Sed Rate: 48 — ABNORMAL HIGH

## 2011-09-06 LAB — ROCKY MTN SPOTTED FVR AB, IGM-BLOOD: RMSF IgM: 0.28 IV

## 2011-09-06 LAB — ROCKY MTN SPOTTED FVR AB, IGG-BLOOD: RMSF IgG: <1:64 {titer}

## 2011-09-06 LAB — RHEUMATOID FACTOR: Rheumatoid fact SerPl-aCnc: <20

## 2011-09-06 LAB — ANA: Anti Nuclear Antibody(ANA): NEGATIVE

## 2011-12-11 HISTORY — PX: OTHER SURGICAL HISTORY: SHX169

## 2012-06-09 HISTORY — PX: NM MYOVIEW LTD: HXRAD82

## 2012-12-19 ENCOUNTER — Other Ambulatory Visit (HOSPITAL_COMMUNITY): Payer: Self-pay | Admitting: Cardiovascular Disease

## 2012-12-19 DIAGNOSIS — I2510 Atherosclerotic heart disease of native coronary artery without angina pectoris: Secondary | ICD-10-CM

## 2012-12-19 DIAGNOSIS — R0989 Other specified symptoms and signs involving the circulatory and respiratory systems: Secondary | ICD-10-CM

## 2012-12-19 DIAGNOSIS — I10 Essential (primary) hypertension: Secondary | ICD-10-CM

## 2012-12-19 DIAGNOSIS — R9431 Abnormal electrocardiogram [ECG] [EKG]: Secondary | ICD-10-CM

## 2013-01-10 HISTORY — PX: TRANSTHORACIC ECHOCARDIOGRAM: SHX275

## 2013-01-14 ENCOUNTER — Ambulatory Visit (HOSPITAL_COMMUNITY)
Admission: RE | Admit: 2013-01-14 | Discharge: 2013-01-14 | Disposition: A | Discharge status: Home / Self Care | Payer: Medicare Other | Source: Ambulatory Visit | Attending: Cardiovascular Disease | Admitting: Cardiovascular Disease

## 2013-01-14 ENCOUNTER — Encounter (HOSPITAL_COMMUNITY): Payer: Self-pay

## 2013-01-14 DIAGNOSIS — I319 Disease of pericardium, unspecified: Secondary | ICD-10-CM | POA: Insufficient documentation

## 2013-01-14 DIAGNOSIS — R9431 Abnormal electrocardiogram [ECG] [EKG]: Secondary | ICD-10-CM | POA: Insufficient documentation

## 2013-01-14 DIAGNOSIS — R0989 Other specified symptoms and signs involving the circulatory and respiratory systems: Secondary | ICD-10-CM | POA: Insufficient documentation

## 2013-01-14 DIAGNOSIS — I10 Essential (primary) hypertension: Secondary | ICD-10-CM

## 2013-01-14 DIAGNOSIS — I2510 Atherosclerotic heart disease of native coronary artery without angina pectoris: Secondary | ICD-10-CM

## 2013-01-14 DIAGNOSIS — I080 Rheumatic disorders of both mitral and aortic valves: Secondary | ICD-10-CM | POA: Insufficient documentation

## 2013-01-14 DIAGNOSIS — I369 Nonrheumatic tricuspid valve disorder, unspecified: Secondary | ICD-10-CM | POA: Insufficient documentation

## 2013-01-14 NOTE — Progress Notes (Signed)
2D Echo Performed 01/14/2013    Brenlyn Beshara, RCS  

## 2013-01-14 NOTE — Progress Notes (Signed)
Carotid Doppler completed. Charles Sellers

## 2013-06-11 ENCOUNTER — Other Ambulatory Visit: Payer: Self-pay

## 2013-06-11 MED ORDER — METOPROLOL SUCCINATE ER 25 MG PO TB24: 12.5000 mg | ORAL_TABLET | Freq: Every day | ORAL | Status: DC

## 2013-06-11 MED ORDER — CLOPIDOGREL BISULFATE 75 MG PO TABS: 75.0000 mg | ORAL_TABLET | Freq: Every day | ORAL | Status: DC

## 2013-06-11 NOTE — Telephone Encounter (Signed)
Rx was sent to pharmacy electronically via [employer].  

## 2013-08-10 HISTORY — PX: OTHER SURGICAL HISTORY: SHX169

## 2013-09-02 ENCOUNTER — Encounter: Payer: Self-pay | Admitting: Cardiovascular Disease

## 2013-12-02 ENCOUNTER — Ambulatory Visit: Payer: Self-pay | Admitting: Family Medicine

## 2013-12-28 ENCOUNTER — Other Ambulatory Visit: Payer: Self-pay | Admitting: *Deleted

## 2013-12-28 MED ORDER — CLOPIDOGREL BISULFATE 75 MG PO TABS: 75.0000 mg | ORAL_TABLET | Freq: Every day | ORAL | Status: DC

## 2013-12-28 MED ORDER — ROSUVASTATIN CALCIUM 5 MG PO TABS
5.0000 mg | ORAL_TABLET | Freq: Every day | ORAL | Status: DC
Start: 2013-12-28 — End: 2013-12-30

## 2013-12-28 MED ORDER — PANTOPRAZOLE SODIUM 40 MG PO TBEC: 40.0000 mg | DELAYED_RELEASE_TABLET | Freq: Every day | ORAL | Status: DC

## 2013-12-28 MED ORDER — METOPROLOL SUCCINATE ER 25 MG PO TB24: 12.5000 mg | ORAL_TABLET | Freq: Every day | ORAL | Status: DC

## 2013-12-28 MED ORDER — RAMIPRIL 5 MG PO CAPS: 5.0000 mg | ORAL_CAPSULE | Freq: Every day | ORAL | Status: DC

## 2013-12-30 ENCOUNTER — Other Ambulatory Visit: Payer: Self-pay

## 2013-12-30 MED ORDER — ROSUVASTATIN CALCIUM 5 MG PO TABS: 5.0000 mg | ORAL_TABLET | Freq: Every day | ORAL | Status: DC

## 2013-12-30 NOTE — Telephone Encounter (Signed)
Pt would only like a 5 day supply, just until mail order comes

## 2014-02-10 ENCOUNTER — Encounter: Payer: Self-pay | Admitting: *Deleted

## 2014-02-10 ENCOUNTER — Encounter: Payer: Self-pay | Admitting: Cardiology

## 2014-02-10 ENCOUNTER — Ambulatory Visit (INDEPENDENT_AMBULATORY_CARE_PROVIDER_SITE_OTHER): Payer: Commercial Managed Care - HMO | Admitting: Cardiology

## 2014-02-10 VITALS — BP 163/75 | HR 62 | Ht 68.0 in | Wt 178.0 lb

## 2014-02-10 DIAGNOSIS — I152 Hypertension secondary to endocrine disorders: Secondary | ICD-10-CM | POA: Insufficient documentation

## 2014-02-10 DIAGNOSIS — E1169 Type 2 diabetes mellitus with other specified complication: Secondary | ICD-10-CM | POA: Insufficient documentation

## 2014-02-10 DIAGNOSIS — Z9861 Coronary angioplasty status: Secondary | ICD-10-CM

## 2014-02-10 DIAGNOSIS — E785 Hyperlipidemia, unspecified: Secondary | ICD-10-CM

## 2014-02-10 DIAGNOSIS — E1159 Type 2 diabetes mellitus with other circulatory complications: Secondary | ICD-10-CM | POA: Insufficient documentation

## 2014-02-10 DIAGNOSIS — I2510 Atherosclerotic heart disease of native coronary artery without angina pectoris: Secondary | ICD-10-CM | POA: Insufficient documentation

## 2014-02-10 DIAGNOSIS — I10 Essential (primary) hypertension: Secondary | ICD-10-CM

## 2014-02-10 DIAGNOSIS — I25810 Atherosclerosis of coronary artery bypass graft(s) without angina pectoris: Secondary | ICD-10-CM

## 2014-02-10 DIAGNOSIS — E119 Type 2 diabetes mellitus without complications: Secondary | ICD-10-CM

## 2014-02-10 NOTE — Progress Notes (Signed)
PATIENT: Charles Sellers MRN: [id number] DOB: 1932/11/08 PCP: Margarita Rana, MD  Clinic Note: Chief Complaint  Patient presents with  . other    No complaints. Meds reviewed verbally with pt.    HPI: Charles Sellers is a 80 y.o. male with a PMH below who presents today for reestablishment of cardiology care at a retirement of Dr. Terance Ice.  He was last seen in September 2014 and was stable. He has a history of multivessel CAD status post PCI to the LAD, mid RCA and RPL with a known occlusion of the circumflex. This was all 2005 following an abnormal Myoview..  Interval History: Charles Sellers presents today just for routine followup and continues to be very stable overall from a cardiac standpoint. He has not had any of his anginal symptoms since his last PCI. He denies any exertional or resting chest pressure/tightness or dyspnea. No PND, orthopnea or edema. No palpitations, lightheadedness, dizziness, weakness or syncope/near syncope. No TIA/amaurosis fugax symptoms. No melena, hematochezia hematuria.  Past Medical History  Diagnosis Date  . CAD S/P percutaneous coronary angioplasty 12/13 & 15 2005    PCI - mRCA (Cypher DES 3.5 mm x 18 mm --> 4.80mm); RPL Cypher 2.5 mm x 13 mm;; mLAD Cy a pher DES 3.0 mm x 13 mm (3.5 mm) with Cutting PTCA of D2 ostium;; mid Cx 100% after small OM1.  . Diabetes mellitus type II, controlled     Diet controlled. No medications  . Dyslipidemia, goal LDL below 70      on statin  . Hypertension   . Ventral hernia    Prior Cardiac Evaluation and Past Surgical History: Past Surgical History  Procedure Laterality Date  . Coronary angioplasty with stent placement      mRCA Cypher DES 3.5 mm x 18 mm (4.0), RPL Cypher DES 2.5 mm x 13 mm; mLAD 3.0 mm x 13 mm Cypher -> cutting PTCA of jailed D2; previousl PCI of Cx - 100% mid Cx.  Marland Kitchen Appendectomy    . Nm myoview ltd  06/2012    Small area of basal inferior infarct with no ischemia. Normal EF  . Transthoracic  echocardiogram  February 2014     EF 55%. Mild-moderate MR    Allergies  Allergen Reactions  . Penicillins     Current Outpatient Prescriptions  Medication Sig Dispense Refill  . aspirin 81 MG tablet Take 81 mg by mouth daily.      . clopidogrel (PLAVIX) 75 MG tablet Take 1 tablet (75 mg total) by mouth daily.  90 tablet  2  . metoprolol succinate (TOPROL-XL) 25 MG 24 hr tablet Take 0.5 tablets (12.5 mg total) by mouth daily.  90 tablet  2  . Multiple Vitamin (MULTIVITAMIN) tablet Take 1 tablet by mouth daily.      . nitroGLYCERIN (NITROSTAT) 0.4 MG SL tablet Place 0.4 mg under the tongue every 5 (five) minutes as needed for chest pain.      . Omega-3 Fatty Acids (FISH OIL) 1200 MG CAPS Take 2 capsules by mouth daily.      . pantoprazole (PROTONIX) 40 MG tablet Take 1 tablet (40 mg total) by mouth daily.  90 tablet  2  . ramipril (ALTACE) 5 MG capsule Take 1 capsule (5 mg total) by mouth daily.  90 capsule  2  . rosuvastatin (CRESTOR) 5 MG tablet Take 1 tablet (5 mg total) by mouth daily.  7 tablet  0   No current facility-administered medications for this  visit.    History   Social History Narrative   Married father of 3, 9 grandchildren, now 14 great-grandchildren. 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   Using the toilet, but does not do routine exercise. Always on the go.   Never smoked. Does not drink alcohol.    family history includes Liver cancer in his father; Stroke in his mother.  ROS: A comprehensive Review of Systems - Negative except Recent scrape on his right lower leg while moving firewood.  PHYSICAL EXAM BP 163/75  Pulse 62  Ht 5\' 8"  (1.727 m)  Wt 178 lb (80.74 kg)  BMI 27.07 kg/m2 General appearance: alert, cooperative, appears stated age, no distress and Pleasant mood and affect. Well-nourished and well-groomed. Answers questions  appropriately. HEENT: [place]/AT, EOMI, MMM, anicteric sclera Neck: no adenopathy, no carotid bruit, no JVD, supple, symmetrical, trachea midline and thyroid not enlarged, symmetric, no tenderness/mass/nodules Lungs: clear to auscultation bilaterally, normal percussion bilaterally and Nonlabored, good air movement. Heart: normal apical impulse, S1, S2 normal and RRR. Soft 1/6 HSM at left sternal border.; No rubs or gallop Abdomen: soft, non-tender; bowel sounds normal; no masses,  no organomegaly Extremities: extremities normal, atraumatic, no cyanosis or edema and varicose veins noted Pulses: 2+ and symmetric Neurologic: Alert and oriented X 3, normal strength and tone. Normal symmetric reflexes. Normal coordination and gait; CN 2-12 grossly intact  [id number] today: Yes Rate: 62 , Rhythm: NSR; borderline LVH, inferior MI age undetermined. Nonspecific T wave inversions in leads 3 and aVL.    Recent Labs: none since August 2014: Total cholesterol 117, LDL 64, HDL 38.  ASSESSMENT / PLAN: Very pleasant elderly gentleman with stable coronary artery disease and stable risk factors.  CAD S/P percutaneous coronary angioplasty Very stable overall. No recurrent symptoms. Remains on dual antiplatelet therapy which is appropriate based on the number of Cypher stents. He can stop aspirin if need be for significant bleeding episodes. He can also stop Plavix if needed for any surgeries or procedures.  Hypertension His blood pressure is somewhat elevated today. In the past has always been normal. He has been stable on low dose of Toprol plus ramipril. I will continue to monitor. His blood pressure continues to be elevated would consider increasing ACE inhibitor dose. I do suspect that he is somewhat anxious about meeting a cardiologist in a new office.  Dyslipidemia, goal LDL below 70 Relatively well controlled on Crestor. He should have labs checked before seeing back in 6 months. He says that he usually  has been done for his annual physicals. If not we'll get that done next visit. He is well enough controlled that his levels can be rechecked annually.  Diabetes mellitus type II, controlled Currently not on any medications. Stable. Monitored by PCP.    Orders Placed This Encounter  Procedures  . EKG 12-Lead    Order Specific Question:  Where should this test be performed    Answer:  [facility]   Meds ordered this encounter  Medications  . Multiple Vitamin (MULTIVITAMIN) tablet    Sig: Take 1 tablet by mouth daily.  . Omega-3 Fatty Acids (FISH OIL) 1200 MG CAPS    Sig: Take 2 capsules by mouth daily.  Marland Kitchen aspirin 81 MG tablet    Sig: Take 81 mg by mouth daily.  . nitroGLYCERIN (NITROSTAT) 0.4 MG SL tablet    Sig:  Place 0.4 mg under the tongue every 5 (five) minutes as needed for chest pain.    Followup: 6 months will need a referral within 90 days of scheduled appointment  Emonii Wienke W. Ellyn Hack, M.D., M.S. Interventional Cardiolgy [facility]

## 2014-02-10 NOTE — Assessment & Plan Note (Signed)
Relatively well controlled on Crestor. He should have labs checked before seeing back in 6 months. He says that he usually has been done for his annual physicals. If not we'll get that done next visit. He is well enough controlled that his levels can be rechecked annually.

## 2014-02-10 NOTE — Assessment & Plan Note (Signed)
Currently not on any medications. Stable. Monitored by PCP.

## 2014-02-10 NOTE — Patient Instructions (Signed)
Your physician wants you to follow-up in: 6 months  You will receive a reminder letter in the mail two months in advance. If you don't receive a letter, please call our office to schedule the follow-up appointment.  Your physician recommends that you continue on your current medications as directed. Please refer to the Current Medication list given to you today.  

## 2014-02-10 NOTE — Assessment & Plan Note (Addendum)
His blood pressure is somewhat elevated today. In the past has always been normal. He has been stable on low dose of Toprol plus ramipril. I will continue to monitor. His blood pressure continues to be elevated would consider increasing ACE inhibitor dose. I do suspect that he is somewhat anxious about meeting a cardiologist in a new office.

## 2014-02-10 NOTE — Assessment & Plan Note (Signed)
Very stable overall. No recurrent symptoms. Remains on dual antiplatelet therapy which is appropriate based on the number of Cypher stents. He can stop aspirin if need be for significant bleeding episodes. He can also stop Plavix if needed for any surgeries or procedures.

## 2014-02-11 ENCOUNTER — Ambulatory Visit: Payer: Medicare Other | Admitting: Cardiology

## 2014-08-11 ENCOUNTER — Encounter: Payer: Self-pay | Admitting: Cardiology

## 2014-08-11 ENCOUNTER — Ambulatory Visit (INDEPENDENT_AMBULATORY_CARE_PROVIDER_SITE_OTHER): Payer: Commercial Managed Care - HMO | Admitting: Cardiology

## 2014-08-11 VITALS — BP 120/72 | HR 76 | Ht 66.5 in | Wt 177.0 lb

## 2014-08-11 DIAGNOSIS — I2510 Atherosclerotic heart disease of native coronary artery without angina pectoris: Secondary | ICD-10-CM

## 2014-08-11 DIAGNOSIS — I10 Essential (primary) hypertension: Secondary | ICD-10-CM

## 2014-08-11 DIAGNOSIS — E785 Hyperlipidemia, unspecified: Secondary | ICD-10-CM

## 2014-08-11 DIAGNOSIS — Z9861 Coronary angioplasty status: Secondary | ICD-10-CM

## 2014-08-11 DIAGNOSIS — E119 Type 2 diabetes mellitus without complications: Secondary | ICD-10-CM

## 2014-08-11 MED ORDER — CLOPIDOGREL BISULFATE 75 MG PO TABS: 75.0000 mg | ORAL_TABLET | Freq: Every day | ORAL | Status: DC

## 2014-08-11 MED ORDER — ROSUVASTATIN CALCIUM 5 MG PO TABS: 5.0000 mg | ORAL_TABLET | Freq: Every day | ORAL | Status: DC

## 2014-08-11 MED ORDER — RAMIPRIL 5 MG PO CAPS: 5.0000 mg | ORAL_CAPSULE | Freq: Every day | ORAL | Status: DC

## 2014-08-11 MED ORDER — PANTOPRAZOLE SODIUM 40 MG PO TBEC: 40.0000 mg | DELAYED_RELEASE_TABLET | Freq: Every day | ORAL | Status: DC

## 2014-08-11 NOTE — Progress Notes (Signed)
PCP: MALONEY,NANCY, MD  Clinic Note: Chief Complaint  Patient presents with  . other    6 month f/u no complaints. Meds reviewed verbally with pt.    HPI: DAMAURI MINION is a 80 y.o. male with a Cardiovascular Problem List below who presents today for six-month followup of his three-vessel CAD. He is a poor patient of Dr. Terance Ice. I last saw him in March and he was doing very well at that time. He is very active doing various, household jobs including a maintenance, building and painting as well as mowing lawns.  Interval History: He presents today doing very well numerous episodes of having relatively brisk bleeding when cutting himself but otherwise really no symptoms of resting or exertional chest tightness or pressure. As noted he is very active and has not had any symptoms of angina or dyspnea at rest or exertion. No heart failure symptoms of PND, orthopnea or edema. No rapid or irregular heartbeats/palpitations. No syncope or near-syncope, TIA/amaurosis fugax. No melena, hematochezia hematuria or epistaxis but does have brisk using bruising. No significant GERD symptoms. No myalgias or arthralgias. No claudication.  Past Medical History  Diagnosis Date  . CAD S/P percutaneous coronary angioplasty 12/13 & 15 2005    PCI - mRCA (Cypher DES 3.5 mm x 18 mm --> 4.50mm); RPL Cypher 2.5 mm x 13 mm;; mLAD Cy a pher DES 3.0 mm x 13 mm (3.5 mm) with Cutting PTCA of D2 ostium;; mid Cx 100% after small OM1.  . Diabetes mellitus type II, controlled     Diet controlled. No medications  . Dyslipidemia, goal LDL below 70      on statin  . Hypertension   . Ventral hernia   . Prostate nodule   . Atherosclerosis   . Cholecystitis   . Dermatitis    Prior Cardiac Evaluation and Past Surgical History: Past Surgical History  Procedure Laterality Date  . Coronary angioplasty with stent placement      mRCA Cypher DES 3.5 mm x 18 mm (4.0), RPL Cypher DES 2.5 mm x 13 mm; mLAD 3.0 mm x 13 mm  Cypher -> cutting PTCA of jailed D2; previousl PCI of Cx - 100% mid Cx.  Marland Kitchen Appendectomy    . Nm myoview ltd  06/2012    Small area of basal inferior infarct with no ischemia. Normal EF  . Transthoracic echocardiogram  February 2014     EF 55%. Mild-moderate MR   MEDICATIONS AND ALLERGIES REVIEWED IN EPIC No Change in Social and Family History  ROS: A comprehensive Review of Systems - was performed Review of Systems  Constitutional: Negative for fever, chills and diaphoresis.  Eyes: Negative for blurred vision.  Respiratory: Negative for cough, hemoptysis, sputum production, shortness of breath and wheezing.   Cardiovascular: Negative.        Per history of present illness  Gastrointestinal: Negative for heartburn, blood in stool and melena.  Neurological: Negative for dizziness, sensory change, speech change, focal weakness, seizures, loss of consciousness, weakness and headaches.  Endo/Heme/Allergies: Bruises/bleeds easily.  Psychiatric/Behavioral: Negative for depression. The patient is not nervous/anxious.   All other systems reviewed and are negative.  Wt Readings from Last 3 Encounters:  08/11/14 177 lb (80.287 kg)  02/10/14 178 lb (80.74 kg)  07/20/08 178 lb 3.2 oz (80.831 kg)    PHYSICAL EXAM BP 120/72  Pulse 76  Ht 5' 6.5" (1.689 m)  Wt 177 lb (80.287 kg)  BMI 28.14 kg/m2 General appearance: alert, cooperative,  appears stated age, no distress and Pleasant mood and affect. Well-nourished and well-groomed. Answers questions appropriately.  HEENT: [place]/AT, EOMI, MMM, anicteric sclera  Neck: no adenopathy, no carotid bruit, no JVD, supple, symmetrical, trachea midline and thyroid not enlarged, symmetric, no tenderness/mass/nodules  Lungs: clear to auscultation bilaterally, normal percussion bilaterally and Nonlabored, good air movement.  Heart: normal apical impulse, S1, S2 normal and RRR. Soft 1/6 HSM at left sternal border.; No rubs or gallop  Abdomen: soft, non-tender;  bowel sounds normal; no masses, no organomegaly  Extremities: extremities normal, atraumatic, no cyanosis or edema and varicose veins noted  Pulses: 2+ and symmetric  Neurologic: Alert and oriented X 3, normal strength and tone. Normal symmetric reflexes. Normal coordination and gait; CN 2-12 grossly intact   Adult ECG Report not performed  Recent Labs checked by PCP, however not available:   ASSESSMENT / PLAN: CAD S/P percutaneous coronary angioplasty Very stable with no active symptoms. Continues to be on aspirin plus Plavix. I think we can probably stop the aspirin to avoid the risk of bleeding. He is on a statin ACE inhibitor and beta blocker. On PPI to help with GI complaints on aspirin plus Plavix. I would still like to continue Plavix with his 3 Cypher stents.  Plan: Stop aspirin. If Plavix is held for procedures, would recommend going back to aspirin without Plavix until able to restart Plavix.    Dyslipidemia, goal LDL below 70 Had been controlled in the past. Monitored by PCP. On Crestor.  Essential hypertension Blood pressure looks much better today he knows he is not anxious about meeting a new cardiologist. He is on stable dose of Toprol and Altace.  Diabetes mellitus type II, controlled This is a new diagnosis for him from recent labs in the past year. He is a little bit and distally from the diagnosis. He is not currently on any medications for glycemic control. Defer management to PCP. There is a significance for having glycemic control in order to prevent any exacerbation of his coronary disease.    No orders of the defined types were placed in this encounter.   Meds ordered this encounter  Medications  . rosuvastatin (CRESTOR) 5 MG tablet    Sig: Take 1 tablet (5 mg total) by mouth daily.    Dispense:  90 tablet    Refill:  3  . pantoprazole (PROTONIX) 40 MG tablet    Sig: Take 1 tablet (40 mg total) by mouth daily.    Dispense:  90 tablet    Refill:  3  .  clopidogrel (PLAVIX) 75 MG tablet    Sig: Take 1 tablet (75 mg total) by mouth daily.    Dispense:  90 tablet    Refill:  3  . ramipril (ALTACE) 5 MG capsule    Sig: Take 1 capsule (5 mg total) by mouth daily.    Dispense:  90 capsule    Refill:  3    Followup: 6 months  Ersa Delaney W. Ellyn Hack, M.D., M.S. Interventional Cardiologist [facility]

## 2014-08-11 NOTE — Assessment & Plan Note (Addendum)
This is a new diagnosis for him from recent labs in the past year. He is a little bit and distally from the diagnosis. He is not currently on any medications for glycemic control. Defer management to PCP. There is a significance for having glycemic control in order to prevent any exacerbation of his coronary disease.

## 2014-08-11 NOTE — Assessment & Plan Note (Signed)
Very stable with no active symptoms. Continues to be on aspirin plus Plavix. I think we can probably stop the aspirin to avoid the risk of bleeding. He is on a statin ACE inhibitor and beta blocker. On PPI to help with GI complaints on aspirin plus Plavix. I would still like to continue Plavix with his 3 Cypher stents.  Plan: Stop aspirin. If Plavix is held for procedures, would recommend going back to aspirin without Plavix until able to restart Plavix.

## 2014-08-11 NOTE — Assessment & Plan Note (Signed)
Had been controlled in the past. Monitored by PCP. On Crestor.

## 2014-08-11 NOTE — Patient Instructions (Signed)
Your physician has recommended you make the following change in your medication:  Stop Aspirin  If you need to come off Plavix for a procedure, restart aspirin at that time    Your prescriptions have been refilled   Your physician wants you to follow-up in: 6 months. You will receive a reminder letter in the mail two months in advance. If you don't receive a letter, please call our office to schedule the follow-up appointment.

## 2014-08-11 NOTE — Assessment & Plan Note (Signed)
Blood pressure looks much better today he knows he is not anxious about meeting a new cardiologist. He is on stable dose of Toprol and Altace.

## 2014-08-30 ENCOUNTER — Ambulatory Visit: Payer: Self-pay | Admitting: Family Medicine

## 2014-09-08 ENCOUNTER — Other Ambulatory Visit: Payer: Self-pay | Admitting: Cardiovascular Disease

## 2014-09-08 NOTE — Telephone Encounter (Signed)
Refilled #90 tablets with 3 refills on 08/11/2014

## 2014-09-09 ENCOUNTER — Ambulatory Visit: Payer: Self-pay | Admitting: Family Medicine

## 2015-01-07 ENCOUNTER — Other Ambulatory Visit: Payer: Self-pay | Admitting: Cardiovascular Disease

## 2015-01-10 NOTE — Telephone Encounter (Signed)
Rx refill denied to patient pharmacy  Rx was filled September 2015 for #90 with 2 additional refills

## 2015-01-27 ENCOUNTER — Other Ambulatory Visit: Payer: Self-pay | Admitting: Cardiovascular Disease

## 2015-01-27 NOTE — Telephone Encounter (Signed)
Rx(s) sent to pharmacy electronically.  

## 2015-02-09 ENCOUNTER — Encounter: Payer: Self-pay | Admitting: Cardiology

## 2015-02-09 ENCOUNTER — Ambulatory Visit (INDEPENDENT_AMBULATORY_CARE_PROVIDER_SITE_OTHER): Payer: Commercial Managed Care - HMO | Admitting: Cardiology

## 2015-02-09 VITALS — BP 110/70 | HR 61 | Ht 67.0 in | Wt 167.0 lb

## 2015-02-09 DIAGNOSIS — R0789 Other chest pain: Secondary | ICD-10-CM

## 2015-02-09 DIAGNOSIS — Z9861 Coronary angioplasty status: Secondary | ICD-10-CM

## 2015-02-09 DIAGNOSIS — I2510 Atherosclerotic heart disease of native coronary artery without angina pectoris: Secondary | ICD-10-CM

## 2015-02-09 DIAGNOSIS — E785 Hyperlipidemia, unspecified: Secondary | ICD-10-CM

## 2015-02-09 DIAGNOSIS — E119 Type 2 diabetes mellitus without complications: Secondary | ICD-10-CM

## 2015-02-09 DIAGNOSIS — I10 Essential (primary) hypertension: Secondary | ICD-10-CM

## 2015-02-09 NOTE — Patient Instructions (Signed)
Your physician recommends that you continue on your current medications as directed. Please refer to the Current Medication list given to you today.  Your physician wants you to follow-up in: 6 months. You will receive a reminder letter in the mail two months in advance. If you don't receive a letter, please call our office to schedule the follow-up appointment.  

## 2015-02-09 NOTE — Progress Notes (Signed)
PCP: Margarita Rana, MD  Clinic Note: Chief Complaint  Patient presents with  . other    6 month f/u c/o chest discomfort. Meds reviewed verbally with pt.  . Coronary Artery Disease    Multisite PCI    HPI: Charles Sellers. is a 81 y.o. male with a Cardiovascular Problem List below who presents today for six-month followup of his three-vessel CAD - with multisite PCI. He is a formerpatient of Dr. Terance Ice. I last saw him in September 2015and he was doing very well at that time. He is very active doing various, household jobs including a maintenance, building and painting as well as mowing lawns.  Interval History: He presents today doing very well with the exception of being extremely sore from a recent fall.   He is continuing on the go doing garden work following entailing. He denies any symptoms of resting or exertional chest tightness or pressure.the only thing he notes is that he leads quite a bit he cut himself. He denies any symptoms of angina or dyspnea at rest or exertion. No heart failure symptoms of PND, orthopnea or edema. No rapid or irregular heartbeats/palpitations. No syncope or near-syncope, TIA/amaurosis fugax. No melena, hematochezia hematuria or epistaxis but does have brisk using bruising. No significant GERD symptoms. No myalgias or arthralgias. No claudication.  Past Medical History  Diagnosis Date  . CAD S/P percutaneous coronary angioplasty 12/13 & 15 2005    PCI - mRCA (Cypher DES 3.5 mm x 18 mm --> 4.60mm); RPL Cypher 2.5 mm x 13 mm;; mLAD Cy a pher DES 3.0 mm x 13 mm (3.5 mm) with Cutting PTCA of D2 ostium;; mid Cx 100% after small OM1.  . Diabetes mellitus type II, controlled     Diet controlled. No medications; he indicates "borderline diabetes"  . Dyslipidemia, goal LDL below 70      on statin  . Hypertension   . Ventral hernia   . Prostate nodule   . Atherosclerosis   . Cholecystitis   . Dermatitis    Prior Cardiac Evaluation and Past  Surgical History: Past Surgical History  Procedure Laterality Date  . Coronary angioplasty with stent placement      mRCA Cypher DES 3.5 mm x 18 mm (4.0), RPL Cypher DES 2.5 mm x 13 mm; mLAD 3.0 mm x 13 mm Cypher -> cutting PTCA of jailed D2; previousl PCI of Cx - 100% mid Cx.  Marland Kitchen Appendectomy    . Nm myoview ltd  06/2012    Small area of basal inferior infarct with no ischemia. Normal EF  . Transthoracic echocardiogram  February 2014     EF 55%. Mild-moderate MR  . Carotid dopplers  September 2014    Bilateral internal carotids < 49%.   Current Outpatient Prescriptions on File Prior to Visit  Medication Sig Dispense Refill  . clopidogrel (PLAVIX) 75 MG tablet Take 1 tablet (75 mg total) by mouth daily. 90 tablet 3  . metoprolol succinate (TOPROL-XL) 25 MG 24 hr tablet Take 0.5 tablets (12.5 mg total) by mouth daily. 45 tablet 2  . Multiple Vitamin (MULTIVITAMIN) tablet Take 1 tablet by mouth daily.    . nitroGLYCERIN (NITROSTAT) 0.4 MG SL tablet Place 0.4 mg under the tongue every 5 (five) minutes as needed for chest pain.    . Omega-3 Fatty Acids (FISH OIL) 1200 MG CAPS Take 1 capsule by mouth 2 (two) times daily.     . pantoprazole (PROTONIX) 40 MG tablet TAKE  1 TABLET EVERY DAY 90 tablet 2  . ramipril (ALTACE) 5 MG capsule TAKE 1 CAPSULE EVERY DAY 90 capsule 2  . rosuvastatin (CRESTOR) 5 MG tablet Take 1 tablet (5 mg total) by mouth daily. 90 tablet 3   No current facility-administered medications on file prior to visit.   Allergies  Allergen Reactions  . Niaspan [Niacin Er]   . Penicillins    History   Social History Narrative   Married father of 3, 9 grandchildren, now 42 great-grandchildren. 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   Using the toilet, but does not do routine exercise. Always on the go.   Never smoked. Does not drink alcohol.   No Change in  Family History  ROS: A comprehensive Review of Systems - was performed Review of Systems  Constitutional: Negative for weight loss and malaise/fatigue.  HENT: Negative for nosebleeds.   Eyes: Positive for pain (Related to new glasses).  Respiratory: Negative for cough, shortness of breath and wheezing.   Cardiovascular: Negative for claudication and leg swelling.  Gastrointestinal: Negative for blood in stool and melena.  Genitourinary: Negative for hematuria.  Musculoskeletal: Positive for myalgias (With the exception of his left shoulder and back from the fall, he had his intermittent flareups of polymyopathy) and falls (He had a fall about week or so ago. Because of new bifocals he misjudged his depth perception for the last step and a flight of steps and fell in the parking lot. He hit his head and left shoulder. He is now significant discomfort on left arm and shoulder.).  Neurological: Positive for headaches (because of his new bifocals).  Endo/Heme/Allergies: Bruises/bleeds easily.  Psychiatric/Behavioral: Negative.   All other systems reviewed and are negative.    Wt Readings from Last 3 Encounters:  02/09/15 167 lb (75.751 kg)  08/11/14 177 lb (80.287 kg)  02/10/14 178 lb (80.74 kg)    PHYSICAL EXAM BP 110/70 mmHg  Pulse 61  Ht 5\' 7"  (1.702 m)  Wt 167 lb (75.751 kg)  BMI 26.15 kg/m2 General appearance: alert, cooperative, appears stated age, no distress and Pleasant mood and affect. Well-nourished and well-groomed. Answers questions appropriately.  HEENT: [place]/AT, EOMI, MMM, anicteric sclera  Neck: no adenopathy, no carotid bruit, no JVD, supple, symmetrical, trachea midline and thyroid not enlarged, symmetric, no tenderness/mass/nodules  Lungs: clear to auscultation bilaterally, normal percussion bilaterally and Nonlabored, good air movement.  Heart: normal apical impulse, S1, S2 normal and RRR. Soft 1/6 HSM at left sternal border.; No rubs or gallop  Abdomen: soft,  non-tender; bowel sounds normal; no masses, no organomegaly  Extremities: extremities normal, atraumatic, no cyanosis or edema and varicose veins noted  Pulses: 2+ and symmetric  Neurologic: Alert and oriented X 3, normal strength and tone. Normal symmetric reflexes. Normal coordination and gait; CN 2-12 grossly intact   Adult ECG Report  Performed today: Yes Rate:61 , Rhythm: NSR with rare PVCs;  Conduction Delay borderline first-degree AV block (200 msec); AV Block: none; ST-T abnormalities: none Otherwise within normal limits. Normal axis and intervals   Recent Labs checked by PCP, however not available:   By report, hemoglobin A1c in December 2015 was 6.7. (is now off Invokana)  ASSESSMENT / PLAN: CAD S/P percutaneous coronary angioplasty Stable. No active anginal symptoms.   On stable regimen w/ACE inhibitor, beta blocker and statin  Continue Plavix over aspirin due  to first-generation DDS (Cypher stents)   Due to the extent of stents in his never really had significant anginal symptoms, I would like to followup with a nuclear stress test in a similar fashion as underwent a bypass surgery patient. Last stress test was in July of 2014. I think I probably won't take one in about July of 2017. That would be probably ordered this time next year.   Dyslipidemia, goal LDL below 70 On Crestor. Labs followed by PCP. Had been well-controlled in the past.   Essential hypertension Excellent control on beta blocker and ACE inhibitor.   Diabetes mellitus type II, controlled Per PCP note, he had been on Invokana, but is now off x ~2 months HgbA1c in 11/2014 = 6.7 No symptoms of polyphagia, polydipsia or fatigue slight nausea.     Orders Placed This Encounter  Procedures  . EKG 12-Lead    Order Specific Question:  Where should this test be performed    Answer:  [facility]   Meds ordered this encounter  Medications  . cholecalciferol (VITAMIN D) 1000 UNITS tablet    Sig:  Take 1,000 Units by mouth daily.  . naproxen sodium (ANAPROX) 220 MG tablet    Sig: Take 220 mg by mouth as needed.    Followup: 6 months  Zyair Rhein W. Ellyn Hack, M.D., M.S. Interventional Cardiologist [facility]

## 2015-02-11 ENCOUNTER — Encounter: Payer: Self-pay | Admitting: Cardiology

## 2015-02-11 NOTE — Assessment & Plan Note (Signed)
Excellent control on beta blocker and ACE inhibitor.

## 2015-02-11 NOTE — Assessment & Plan Note (Signed)
On Crestor. Labs followed by PCP. Had been well-controlled in the past.

## 2015-02-11 NOTE — Assessment & Plan Note (Addendum)
Stable. No active anginal symptoms.   On stable regimen w/ACE inhibitor, beta blocker and statin  Continue Plavix over aspirin due to first-generation DDS (Cypher stents)   Due to the extent of stents in his never really had significant anginal symptoms, I would like to followup with a nuclear stress test in a similar fashion as underwent a bypass surgery patient. Last stress test was in July of 2014. I think I probably won't take one in about July of 2017. That would be probably ordered this time next year.

## 2015-02-11 NOTE — Assessment & Plan Note (Signed)
Per PCP note, he had been on Invokana, but is now off x ~2 months HgbA1c in 11/2014 = 6.7 No symptoms of polyphagia, polydipsia or fatigue slight nausea.

## 2015-03-24 ENCOUNTER — Telehealth: Payer: Self-pay | Admitting: Cardiology

## 2015-03-24 NOTE — Telephone Encounter (Signed)
Patient's wife Charles Sellers called to check and see if we have referral from PCP for [employer].  Patient was instructed to call PCP and request authorization.

## 2015-06-21 ENCOUNTER — Encounter: Payer: Self-pay | Admitting: *Deleted

## 2015-06-22 DIAGNOSIS — I2510 Atherosclerotic heart disease of native coronary artery without angina pectoris: Secondary | ICD-10-CM | POA: Insufficient documentation

## 2015-06-22 DIAGNOSIS — E119 Type 2 diabetes mellitus without complications: Secondary | ICD-10-CM | POA: Insufficient documentation

## 2015-06-22 DIAGNOSIS — Z87898 Personal history of other specified conditions: Secondary | ICD-10-CM | POA: Insufficient documentation

## 2015-06-22 DIAGNOSIS — N4289 Other specified disorders of prostate: Secondary | ICD-10-CM | POA: Insufficient documentation

## 2015-06-29 ENCOUNTER — Encounter: Payer: Self-pay | Admitting: Family Medicine

## 2015-07-26 ENCOUNTER — Ambulatory Visit (INDEPENDENT_AMBULATORY_CARE_PROVIDER_SITE_OTHER): Payer: Commercial Managed Care - HMO | Admitting: Family Medicine

## 2015-07-26 ENCOUNTER — Encounter: Payer: Self-pay | Admitting: Family Medicine

## 2015-07-26 VITALS — BP 140/70 | HR 66 | Temp 97.9°F | Resp 16 | Wt 178.6 lb

## 2015-07-26 DIAGNOSIS — E119 Type 2 diabetes mellitus without complications: Secondary | ICD-10-CM

## 2015-07-26 DIAGNOSIS — I10 Essential (primary) hypertension: Secondary | ICD-10-CM

## 2015-07-26 DIAGNOSIS — I2510 Atherosclerotic heart disease of native coronary artery without angina pectoris: Secondary | ICD-10-CM | POA: Diagnosis not present

## 2015-07-26 DIAGNOSIS — Z9861 Coronary angioplasty status: Secondary | ICD-10-CM | POA: Diagnosis not present

## 2015-07-26 DIAGNOSIS — E785 Hyperlipidemia, unspecified: Secondary | ICD-10-CM

## 2015-07-26 NOTE — Progress Notes (Signed)
Patient: Charles Sellers. Male    DOB: [birth year]   81 y.o.   MRN: [id number] Visit Date: 07/26/2015  Today's Provider: [name], PA   Chief Complaint  Patient presents with  . Follow-up    Diabetes   Subjective:    Diabetes He presents for his follow-up diabetic visit. He has type 2 diabetes mellitus. No MedicAlert identification noted. The initial diagnosis of diabetes was made 2 years ago. Hypoglycemia symptoms include sweats. Pertinent negatives for hypoglycemia include no dizziness, headaches, hunger, mood changes or sleepiness. Pertinent negatives for diabetes include no blurred vision, no chest pain, no fatigue, no visual change and no weakness. Symptoms are stable. Risk factors for coronary artery disease include diabetes mellitus. Current diabetic treatment includes diet. His breakfast blood glucose is taken between 6-7 am. His breakfast blood glucose range is generally 140-180 mg/dl.   Past Medical History  Diagnosis Date  . CAD S/P percutaneous coronary angioplasty 12/13 & 15 2005    PCI - mRCA (Cypher DES 3.5 mm x 18 mm --> 4.70mm); RPL Cypher 2.5 mm x 13 mm;; mLAD Cy a pher DES 3.0 mm x 13 mm (3.5 mm) with Cutting PTCA of D2 ostium;; mid Cx 100% after small OM1.  . Diabetes mellitus type II, controlled     Diet controlled. No medications; he indicates "borderline diabetes"  . Dyslipidemia, goal LDL below 70      on statin  . Hypertension   . Ventral hernia   . Prostate nodule   . Atherosclerosis   . Cholecystitis   . Dermatitis   . CAD (coronary artery disease) 2011 & 2009    Echo, EF =>55% 2011/ Echo, EF =>55% 2009   Past Surgical History  Procedure Laterality Date  . Coronary angioplasty with stent placement      mRCA Cypher DES 3.5 mm x 18 mm (4.0), RPL Cypher DES 2.5 mm x 13 mm; mLAD 3.0 mm x 13 mm Cypher -> cutting PTCA of jailed D2; previousl PCI of Cx - 100% mid Cx.  Marland Kitchen Appendectomy    . Nm myoview ltd  06/2012    Small area of basal inferior  infarct with no ischemia. Normal EF  . Transthoracic echocardiogram  February 2014     EF 55%. Mild-moderate MR  . Carotid dopplers  September 2014    Bilateral internal carotids < 49%.  . Stress myoview  2013    normal findings  . Cardiac catheterization  2005   Family History  Problem Relation Age of Onset  . Stroke Mother   . Liver cancer Father   . Esophageal cancer Brother   . Cerebral aneurysm Daughter   . Melanoma Daughter   . Cerebral aneurysm Son      Allergies  Allergen Reactions  . Niaspan [Niacin Er]   . Penicillins    Previous Medications   CHOLECALCIFEROL (VITAMIN D) 1000 UNITS TABLET    Take 1,000 Units by mouth daily.   CLOPIDOGREL (PLAVIX) 75 MG TABLET    Take 1 tablet (75 mg total) by mouth daily.   METFORMIN (GLUCOPHAGE-XR) 500 MG 24 HR TABLET       METOPROLOL SUCCINATE (TOPROL-XL) 25 MG 24 HR TABLET    Take 0.5 tablets (12.5 mg total) by mouth daily.   MULTIPLE VITAMIN (MULTIVITAMIN) TABLET    Take 1 tablet by mouth daily.   NAPROXEN SODIUM (ANAPROX) 220 MG TABLET    Take 220 mg by mouth as needed.  NITROGLYCERIN (NITROSTAT) 0.4 MG SL TABLET    Place 0.4 mg under the tongue every 5 (five) minutes as needed for chest pain.   OMEGA-3 FATTY ACIDS (FISH OIL) 1200 MG CAPS    Take 1 capsule by mouth 2 (two) times daily.    ONE TOUCH ULTRA TEST TEST STRIP       ONETOUCH DELICA LANCETS FINE MISC       PANTOPRAZOLE (PROTONIX) 40 MG TABLET    TAKE 1 TABLET EVERY DAY   RAMIPRIL (ALTACE) 5 MG CAPSULE    TAKE 1 CAPSULE EVERY DAY   ROSUVASTATIN (CRESTOR) 5 MG TABLET    Take 1 tablet (5 mg total) by mouth daily.    Review of Systems  Constitutional: Negative.  Negative for fatigue.  HENT: Negative.   Eyes: Negative.  Negative for blurred vision.  Respiratory: Negative.   Cardiovascular: Negative.  Negative for chest pain.  Gastrointestinal: Negative.   Genitourinary: Negative.   Musculoskeletal: Negative.   Skin: Negative.   Allergic/Immunologic: Negative.     Neurological: Negative.  Negative for dizziness, weakness and headaches.  Hematological: Bruises/bleeds easily.  Psychiatric/Behavioral: Negative.     Social History  Substance Use Topics  . Smoking status: Never Smoker   . Smokeless tobacco: Not on file  . Alcohol Use: No   Objective:   BP 140/70 mmHg  Pulse 66  Temp(Src) 97.9 F (36.6 C) (Oral)  Resp 16  Wt 178 lb 9.6 oz (81.012 kg)  SpO2 96%  Physical Exam  Constitutional: He appears well-developed and well-nourished.  HENT:  Head: Normocephalic and atraumatic.  Eyes: EOM are normal. Pupils are equal, round, and reactive to light.  Neck: Normal range of motion. Neck supple.  Cardiovascular: Normal rate and regular rhythm.   Pulmonary/Chest: Breath sounds normal.  Abdominal: Soft. Bowel sounds are normal.  Skin: Skin is dry.  Psychiatric: He has a normal mood and affect. His behavior is normal. Thought content normal.      Assessment & Plan:     1. Diabetes mellitus type II, controlled FBS up to 190 this morning. Tolerating Metformin without side effects. 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 Will check labs and consider dosage adjustment or additional medication for control. - Comprehensive metabolic panel - Hemoglobin A1c - Ambulatory referral to diabetic education  2. Essential hypertension Stable and tolerating Metoprolol and Ramipril without side effects. Recheck labs and continue present regimen. - CBC with Differential/Platelet  3. CAD S/P percutaneous coronary angioplasty Denies chest pains, dyspnea, diaphoresis or palpitations. Continues Plavix daily and has not had to use the Nitroglycerin. Recommend continued follow up with cardiologist regularly as planned (Dr. Glenetta Hew).  4. Dyslipidemia, goal LDL below 70 Tolerating Crestor without side effects. Recheck lipids and continue low fat diet. - Lipid panel       [name], PA   [facility]

## 2015-07-27 LAB — CBC WITH DIFFERENTIAL/PLATELET
Basophils Absolute: 0 10*3/uL (ref 0.0–0.2)
Basos: 0 %
EOS (ABSOLUTE): 0.2 10*3/uL (ref 0.0–0.4)
EOS: 3 %
HEMATOCRIT: 43.4 % (ref 37.5–51.0)
Hemoglobin: 14.7 g/dL (ref 12.6–17.7)
IMMATURE GRANULOCYTES: 0 %
Immature Grans (Abs): 0 10*3/uL (ref 0.0–0.1)
LYMPHS ABS: 1.4 10*3/uL (ref 0.7–3.1)
Lymphs: 19 %
MCH: 29 pg (ref 26.6–33.0)
MCHC: 33.9 g/dL (ref 31.5–35.7)
MCV: 86 fL (ref 79–97)
MONOS ABS: 0.7 10*3/uL (ref 0.1–0.9)
Monocytes: 9 %
NEUTROS PCT: 69 %
Neutrophils Absolute: 5.1 10*3/uL (ref 1.4–7.0)
PLATELETS: 209 10*3/uL (ref 150–379)
RBC: 5.07 x10E6/uL (ref 4.14–5.80)
RDW: 13.6 % (ref 12.3–15.4)
WBC: 7.4 10*3/uL (ref 3.4–10.8)

## 2015-07-27 LAB — LIPID PANEL
Chol/HDL Ratio: 3.4 ratio units (ref 0.0–5.0)
Cholesterol, Total: 121 mg/dL (ref 100–199)
HDL: 36 mg/dL — ABNORMAL LOW (ref >39)
LDL Calculated: 72 mg/dL (ref 0–99)
Triglycerides: 66 mg/dL (ref 0–149)
VLDL CHOLESTEROL CAL: 13 mg/dL (ref 5–40)

## 2015-07-27 LAB — COMPREHENSIVE METABOLIC PANEL
A/G RATIO: 1.4 (ref 1.1–2.5)
ALT: 21 IU/L (ref 0–44)
AST: 18 IU/L (ref 0–40)
Albumin: 4.2 g/dL (ref 3.5–4.7)
Alkaline Phosphatase: 71 IU/L (ref 39–117)
BUN/Creatinine Ratio: 21 (ref 10–22)
BUN: 15 mg/dL (ref 8–27)
Bilirubin Total: 0.6 mg/dL (ref 0.0–1.2)
CALCIUM: 9 mg/dL (ref 8.6–10.2)
CO2: 27 mmol/L (ref 18–29)
Chloride: 98 mmol/L (ref 97–108)
Creatinine, Ser: 0.72 mg/dL — ABNORMAL LOW (ref 0.76–1.27)
GFR calc Af Amer: 101 mL/min/{1.73_m2} (ref >59)
GFR, EST NON AFRICAN AMERICAN: 87 mL/min/{1.73_m2} (ref >59)
Globulin, Total: 2.9 g/dL (ref 1.5–4.5)
Glucose: 142 mg/dL — ABNORMAL HIGH (ref 65–99)
POTASSIUM: 5 mmol/L (ref 3.5–5.2)
SODIUM: 141 mmol/L (ref 134–144)
Total Protein: 7.1 g/dL (ref 6.0–8.5)

## 2015-07-27 LAB — HEMOGLOBIN A1C
ESTIMATED AVERAGE GLUCOSE: 163 mg/dL
Hgb A1c MFr Bld: 7.3 % — ABNORMAL HIGH (ref 4.8–5.6)

## 2015-07-28 ENCOUNTER — Encounter: Payer: Self-pay | Admitting: Cardiovascular Disease

## 2015-07-28 ENCOUNTER — Encounter: Payer: Self-pay | Admitting: Cardiology

## 2015-07-29 ENCOUNTER — Telehealth: Payer: Self-pay | Admitting: Family Medicine

## 2015-07-29 NOTE — Telephone Encounter (Signed)
Pt called to request lab results.  CB#[id number]

## 2015-08-01 ENCOUNTER — Telehealth: Payer: Self-pay | Admitting: Family Medicine

## 2015-08-01 ENCOUNTER — Telehealth: Payer: Self-pay

## 2015-08-01 NOTE — Telephone Encounter (Signed)
-----   Message from [place], [place] sent at 07/29/2015  6:31 PM EDT ----- Blood cell counts back to normal. No sign of anemia now. Blood sugar was 142 with Hgb A1C 7.3 (previous value was 6.8 earlier this year). Need to continue present diabetes medication and proceed with diabetic education classes. Recheck levels in 3 months.

## 2015-08-01 NOTE — Telephone Encounter (Deleted)
Pt wife. Shirlee Limerick called to request results from lab work.  CB#[phone]/MW

## 2015-08-01 NOTE — Telephone Encounter (Signed)
LMTCB

## 2015-08-01 NOTE — Telephone Encounter (Signed)
Pt's wife Shirlee Limerick called requesting results of blood work

## 2015-08-03 ENCOUNTER — Encounter: Payer: Commercial Managed Care - HMO | Attending: Family Medicine | Admitting: *Deleted

## 2015-08-03 ENCOUNTER — Encounter: Payer: Self-pay | Admitting: *Deleted

## 2015-08-03 VITALS — BP 152/74 | Ht 68.0 in | Wt 175.9 lb

## 2015-08-03 DIAGNOSIS — E119 Type 2 diabetes mellitus without complications: Secondary | ICD-10-CM | POA: Diagnosis present

## 2015-08-03 NOTE — Progress Notes (Signed)
Diabetes Self-Management Education  Visit Type: First/Initial  Appt. Start Time: 0840 Appt. End Time: 0940  08/03/2015  [name], identified by name and date of birth, is a 81 y.o. male with a diagnosis of Diabetes: Type 2.   ASSESSMENT  Blood pressure 152/74, height 5\' 8"  (1.727 m), weight 175 lb 14.4 oz (79.788 kg). Body mass index is 26.75 kg/(m^2).      Diabetes Self-Management Education - 08/03/15 1015    Visit Information   Visit Type First/Initial   Initial Visit   Diabetes Type Type 2   Are you currently following a meal plan? No   Are you taking your medications as prescribed? Yes  Metformin listed but patient reports he doesn't take this medication   Date Diagnosed 2 years ago   Health Coping   How would you rate your overall health? Good   Psychosocial Assessment   Patient Belief/Attitude about Diabetes Motivated to manage diabetes   Self-care barriers None   Self-management support Family;Doctor's office   Patient Concerns Monitoring;Glycemic Control   Special Needs None   Preferred Learning Style Auditory   Learning Readiness Ready   How often do you need to have someone help you when you read instructions, pamphlets, or other written materials from your doctor or pharmacy? 1 - Never   What is the last grade level you completed in school? 36OQ   Complications   Last HgB A1C per patient/outside source 7.3 %  07/26/15   How often do you check your blood sugar? 1-2 times/day   Fasting Blood glucose range (mg/dL) 130-179;180-200  FBG's 130-190's mg/dL   Have you had a dilated eye exam in the past 12 months? Yes   Have you had a dental exam in the past 12 months? Yes   Are you checking your feet? Yes   How many days per week are you checking your feet? 7   Dietary Intake   Breakfast sausage biscuit   Snack (morning) peanut butter crackers, fruit   Lunch fish sandwich, sweet potato fries, salad   Snack (afternoon) ice cream   Dinner steak, chicken,  salad, sweet potatoe   Snack (evening) milk and peanut butter crackers   Beverage(s) water, diet soda, Gatorade   Exercise   Exercise Type Light (walking / raking leaves)  yardwork, mowing, cutting wood   How many days per week to you exercise? 7   How many minutes per day do you exercise? 60   Total minutes per week of exercise 420   Patient Education   Previous Diabetes Education No   Disease state  Factors that contribute to the development of diabetes   Nutrition management  Role of diet in the treatment of diabetes and the relationship between the three main macronutrients and blood glucose level   Physical activity and exercise  Role of exercise on diabetes management, blood pressure control and cardiac health.   Monitoring Purpose and frequency of SMBG.;Identified appropriate SMBG and/or A1C goals.   Chronic complications Relationship between chronic complications and blood glucose control   Psychosocial adjustment Identified and addressed patients feelings and concerns about diabetes   Individualized Goals (developed by patient)   Reducing Risk examine blood glucose patterns Improve blood sugars    Outcomes   Expected Outcomes Demonstrated interest in learning. Expect positive outcomes   Future DMSE 4-6 wks - per pt's request      Individualized Plan for Diabetes Self-Management Training:   Learning Objective:  Patient will have a  greater understanding of diabetes self-management. Patient education plan is to attend individual and/or group sessions per assessed needs and concerns.   Plan:   Patient Instructions  Check blood sugars 1 x day before breakfast or 2 hrs after supper every day Exercise:  Continue yard work and cutting wood daily Avoid sugar sweetened drinks (sports drinks) or drink G 2 instead of regular Gatorade Eat 3 meals day,  1-2  snacks a day Space meals 4-6 hours apart Have 1 protein and 1 carbohydrate serving at bedtime Limit desserts/sweets at  bedtime Complete 3 Day Food Record and bring to next appt Bring blood sugar records to the next appointment  Expected Outcomes:  Demonstrated interest in learning. Expect positive outcomes  Education material provided:  General Meal Planning Guidelines 3 Day Food Record  If problems or questions, patient to contact team via:   Johny Drilling, RN, CCM, CDE [phone]  Future DSME appointment:  Pt didn't want to attend classes at this time but agreed to 2 Hr Refresher program. Next appt is Wednesday Sept 21, 2016  at 8:30 am with Freda Munro (nurse). He didn't want to follow up with a dietitian.

## 2015-08-03 NOTE — Patient Instructions (Addendum)
Check blood sugars 1 x day before breakfast or 2 hrs after supper every day Exercise:  Continue yard work and cutting wood daily Avoid sugar sweetened drinks (sports drinks) or drink G 2 instead of regular Gatorade Eat 3 meals day,  1-2  snacks a day Space meals 4-6 hours apart Have 1 protein and 1 carbohydrate serving at bedtime Limit desserts/sweets at bedtime Complete 3 Day Food Record and bring to next appt Bring blood sugar records to the next appointment Return for appointment on:  Wednesday Sept 21, 2016  At 8:30 am with Freda Munro (nurse)

## 2015-08-09 NOTE — Telephone Encounter (Signed)
Pt returned call. Pt stated he is about to leave and if we call and he isn't there to just speak with his wife because she handles everything. Thanks TNP

## 2015-08-09 NOTE — Telephone Encounter (Signed)
Metformin 500 mg once a day.

## 2015-08-09 NOTE — Telephone Encounter (Signed)
Patient advised as directed below. Patient and patient's wife verbalized understanding. Patient will call back to schedule a follow up appointment.

## 2015-08-09 NOTE — Telephone Encounter (Signed)
LMTCB

## 2015-08-09 NOTE — Telephone Encounter (Signed)
Patient advised as directed below. Patient verbalized understanding. Patient states he currently does not take any diabetes medication. Patient states he does have Metformin at home but has not taken it at all. Please advise which diabetes medication patient should proceed with.

## 2015-08-10 ENCOUNTER — Encounter: Payer: Self-pay | Admitting: Cardiology

## 2015-08-10 ENCOUNTER — Ambulatory Visit (INDEPENDENT_AMBULATORY_CARE_PROVIDER_SITE_OTHER): Payer: Commercial Managed Care - HMO | Admitting: Cardiology

## 2015-08-10 VITALS — BP 118/58 | HR 60 | Ht 68.0 in | Wt 177.1 lb

## 2015-08-10 DIAGNOSIS — Z9861 Coronary angioplasty status: Secondary | ICD-10-CM

## 2015-08-10 DIAGNOSIS — E119 Type 2 diabetes mellitus without complications: Secondary | ICD-10-CM | POA: Diagnosis not present

## 2015-08-10 DIAGNOSIS — E785 Hyperlipidemia, unspecified: Secondary | ICD-10-CM

## 2015-08-10 DIAGNOSIS — I10 Essential (primary) hypertension: Secondary | ICD-10-CM

## 2015-08-10 DIAGNOSIS — I2510 Atherosclerotic heart disease of native coronary artery without angina pectoris: Secondary | ICD-10-CM

## 2015-08-10 NOTE — Assessment & Plan Note (Signed)
Just started metformin. A1c 7.3.

## 2015-08-10 NOTE — Assessment & Plan Note (Signed)
He continues to be stable without any active symptoms. Negative Myoview in 2013. No need for repeat evaluation in the absence of symptoms until maybe next year as per last discussion. He really never did have anginal symptoms prior to his catheterizations and stents in the past.  On Plavix without aspirin- preferred with first generation DES  Continue statin, beta blocker and ACE inhibitor.

## 2015-08-10 NOTE — Assessment & Plan Note (Signed)
Blood pressure looks well controlled on current medications. No change

## 2015-08-10 NOTE — Progress Notes (Signed)
PCP: [name], PA  Clinic Note: Chief Complaint  Patient presents with  . Follow-up    6 mo, "Pt is doing well."  . Coronary Artery Disease    HPI: Charles Sellers. is a 81 y.o. male with a PMH below who presents today for annual follow-up of CAD. Is a former patient of Dr. Terance Ice who I started seeing upon Dr. Lowella Fairy retirement.  Charles Sellers. was last seen on March 2016. He was doing relatively well except for being sore from a fall doing gardening.  Recent Hospitalizations: n/a  Studies Reviewed:  n/a  Lab Results  Component Value Date   CHOL 121 07/26/2015   HDL 36* 07/26/2015   LDLCALC 72 07/26/2015   TRIG 66 07/26/2015   CHOLHDL 3.4 07/26/2015    Interval History: [name] returns today doing well.  He continues to be active. His wife states that he is pretty much on the go from 7 in the morning until 6 at night only coming into the unit the day. He is always doing work in the yard or some other kind of activity. He is totally asymptomatic from cardiac standpoint denying any resting or exertional chest tightness/pressure. No heart failure symptoms of dyspnea, PND, orthopnea or edema.  No palpitations, lightheadedness, dizziness, weakness or syncope/near syncope. No TIA/amaurosis fugax symptoms. No melena, hematochezia, hematuria, or epstaxis - only has nose bleed when he has a cold No claudication.  Past Medical History  Diagnosis Date  . CAD S/P percutaneous coronary angioplasty 12/13 & 15 2005    PCI - mRCA (Cypher DES 3.5 mm x 18 mm --> 4.24mm); RPL Cypher 2.5 mm x 13 mm;; mLAD Cy a pher DES 3.0 mm x 13 mm (3.5 mm) with Cutting PTCA of D2 ostium;; mid Cx 100% after small OM1.  . Diabetes mellitus type II, controlled     Diet controlled. No medications; he indicates "borderline diabetes"  . Dyslipidemia, goal LDL below 70      on statin  . Hypertension   . Ventral hernia   . Prostate nodule   . Atherosclerosis   . Cholecystitis   .  Dermatitis   . CAD (coronary artery disease) 2011 & 2009    Echo, EF =>55% 2011/ Echo, EF =>55% 2009    Past Surgical History  Procedure Laterality Date  . Coronary angioplasty with stent placement      mRCA Cypher DES 3.5 mm x 18 mm (4.0), RPL Cypher DES 2.5 mm x 13 mm; mLAD 3.0 mm x 13 mm Cypher -> cutting PTCA of jailed D2; previousl PCI of Cx - 100% mid Cx.  Marland Kitchen Appendectomy    . Nm myoview ltd  06/2012    Small area of basal inferior infarct with no ischemia. Normal EF  . Transthoracic echocardiogram  February 2014     EF 55%. Mild-moderate MR  . Carotid dopplers  September 2014    Bilateral internal carotids < 49%.  . Stress myoview  2013    normal findings  . Cardiac catheterization  2005   ROS: A comprehensive was performed. Review of Systems  Constitutional: Negative for malaise/fatigue.  HENT: Positive for nosebleeds.   Respiratory: Negative for cough and shortness of breath.   Cardiovascular: Negative for claudication.  Gastrointestinal: Negative for blood in stool and melena.  Genitourinary: Negative for hematuria.  Musculoskeletal: Negative for joint pain and falls.  Neurological: Negative.   Endo/Heme/Allergies: Does not bruise/bleed easily.  Psychiatric/Behavioral: Negative.  All other systems reviewed and are negative.   Prior to Admission medications   Medication Sig Start Date End Date Taking? Authorizing Provider  cholecalciferol (VITAMIN D) 1000 UNITS tablet Take 1,000 Units by mouth daily.   Yes Historical Provider, MD  clopidogrel (PLAVIX) 75 MG tablet Take 1 tablet (75 mg total) by mouth daily. 08/11/14  Yes Leonie Man, MD  metFORMIN (GLUCOPHAGE-XR) 500 MG 24 hr tablet  04/19/15  Yes Historical Provider, MD  metoprolol succinate (TOPROL-XL) 25 MG 24 hr tablet Take 0.5 tablets (12.5 mg total) by mouth daily. 01/27/15  Yes Leonie Man, MD  Multiple Vitamin (MULTIVITAMIN) tablet Take 1 tablet by mouth daily.   Yes Historical Provider, MD  naproxen  sodium (ANAPROX) 220 MG tablet Take 220 mg by mouth as needed.   Yes Historical Provider, MD  nitroGLYCERIN (NITROSTAT) 0.4 MG SL tablet Place 0.4 mg under the tongue every 5 (five) minutes as needed for chest pain.   Yes Historical Provider, MD  Omega-3 Fatty Acids (FISH OIL) 1200 MG CAPS Take 1 capsule by mouth 2 (two) times daily.    Yes Historical Provider, MD  ONE TOUCH ULTRA TEST test strip  07/01/15  Yes Historical Provider, MD  Jonetta Speak LANCETS FINE Rafael Gonzalez  06/15/15  Yes Historical Provider, MD  pantoprazole (PROTONIX) 40 MG tablet TAKE 1 TABLET EVERY DAY Patient taking differently: TAKE 1 TABLET EVERY DAY - pt takes Mon, Wed, Fri 01/10/15  Yes Leonie Man, MD  ramipril (ALTACE) 5 MG capsule TAKE 1 CAPSULE EVERY DAY 01/10/15  Yes Leonie Man, MD  rosuvastatin (CRESTOR) 5 MG tablet Take 1 tablet (5 mg total) by mouth daily. 08/11/14  Yes Leonie Man, MD   Allergies  Allergen Reactions  . Niaspan [Niacin Er] Rash  . Penicillins Rash     Social History   Social History  . Marital Status: Married    Spouse Name: N/A  . Number of Children: N/A  . Years of Education: N/A   Social History Main Topics  . Smoking status: Never Smoker   . Smokeless tobacco: Never Used  . Alcohol Use: No  . Drug Use: No  . Sexual Activity: Not Asked   Other Topics Concern  . None   Social History Narrative   Married father of 3, 9 grandchildren, now 39 great-grandchildren. 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   Using the toilet, but does not do routine exercise. Always on the go.   Never smoked. Does not drink alcohol.   Family History  Problem Relation Age of Onset  . Stroke Mother   . Liver cancer Father   . Diabetes Father   . Esophageal cancer Brother   . Cerebral aneurysm Daughter   . Melanoma Daughter   . Cerebral aneurysm Son     Wt Readings from  Last 3 Encounters:  08/10/15 177 lb 1.9 oz (80.341 kg)  08/03/15 175 lb 14.4 oz (79.788 kg)  07/26/15 178 lb 9.6 oz (81.012 kg)   PHYSICAL EXAM BP 118/58 mmHg  Pulse 60  Ht 5\' 8"  (1.727 m)  Wt 177 lb 1.9 oz (80.341 kg)  BMI 26.94 kg/m2 General appearance: alert, cooperative, appears stated age, no distress and Pleasant mood and affect. Well-nourished and well-groomed. Answers questions appropriately.  HEENT: [place]/AT, EOMI, MMM, anicteric sclera  Neck: no adenopathy, no carotid bruit, no  JVD, supple, symmetrical, trachea midline and thyroid not enlarged, symmetric, no tenderness/mass/nodules  Lungs: clear to auscultation bilaterally, normal percussion bilaterally and Nonlabored, good air movement.  Heart: normal apical impulse, S1, S2 normal and RRR. Soft 1/6 HSM at left sternal border.; No rubs or gallop  Abdomen: soft, non-tender; bowel sounds normal; no masses, no organomegaly  Extremities: extremities normal, atraumatic, no cyanosis or edema and varicose veins noted  Pulses: 2+ and symmetric  Neurologic: Alert and oriented X 3, normal strength and tone. Normal symmetric reflexes. Normal coordination and gait; CN 2-12 grossly intact   Adult ECG Report  Rate: 60 ;  Rhythm: normal sinus rhythm; inferior infarct, age undetermined.  Narrative Interpretation: No significant change from March 2016  Other studies Reviewed: Additional studies/ records that were reviewed today include:  Recent Labs:  See above Lab Results  Component Value Date   HGBA1C 7.3* 07/26/2015    ASSESSMENT / PLAN: Problem List Items Addressed This Visit    CAD S/P percutaneous coronary angioplasty - Primary (Chronic)    He continues to be stable without any active symptoms. Negative Myoview in 2013. No need for repeat evaluation in the absence of symptoms until maybe next year as per last discussion. He really never did have anginal symptoms prior to his catheterizations and stents in the past.  On  Plavix without aspirin- preferred with first generation DES  Continue statin, beta blocker and ACE inhibitor.      Relevant Orders   EKG 12-Lead   Diabetes mellitus type II, controlled (Chronic)    Just started metformin. A1c 7.3.      Relevant Orders   EKG 12-Lead   Dyslipidemia, goal LDL below 70 (Chronic)    On low-dose Crestor. Recent labs show LDL almost at goal. HDL also almost at goal. Would consider evaluating with a NMR or other extended lipid panel to look at particle breakdown.      Relevant Orders   EKG 12-Lead   Essential hypertension (Chronic)    Blood pressure looks well controlled on current medications. No change      Relevant Orders   EKG 12-Lead      Current medicines are reviewed at length with the patient today. (+/- concerns) n/a The following changes have been made: n/a Studies Ordered:   Orders Placed This Encounter  Procedures  . EKG 12-Lead     Leonie Man, M.D., M.S. Interventional Cardiologist   Pager # [phone]

## 2015-08-10 NOTE — Assessment & Plan Note (Signed)
On low-dose Crestor. Recent labs show LDL almost at goal. HDL also almost at goal. Would consider evaluating with a NMR or other extended lipid panel to look at particle breakdown.

## 2015-08-10 NOTE — Patient Instructions (Signed)
Medication Instructions:  Your physician recommends that you continue on your current medications as directed. Please refer to the Current Medication list given to you today.   Labwork: none  Testing/Procedures: none  Follow-Up: Your physician wants you to follow-up in: one year with Dr. Harding. You will receive a reminder letter in the mail two months in advance. If you don't receive a letter, please call our office to schedule the follow-up appointment.   Any Other Special Instructions Will Be Listed Below (If Applicable).   

## 2015-08-23 ENCOUNTER — Telehealth: Payer: Self-pay

## 2015-08-23 ENCOUNTER — Other Ambulatory Visit: Payer: Self-pay

## 2015-08-23 ENCOUNTER — Encounter: Payer: Self-pay | Admitting: *Deleted

## 2015-08-23 MED ORDER — ROSUVASTATIN CALCIUM 5 MG PO TABS: 5.0000 mg | ORAL_TABLET | Freq: Every day | ORAL | Status: DC

## 2015-08-23 NOTE — Telephone Encounter (Signed)
Left message w/CB number on pt home VM that generic crestor submitted to [facility].

## 2015-08-23 NOTE — Telephone Encounter (Signed)
Pt wife called, pt would like generic for Crestor. Please call.[facility] fax [phone]

## 2015-08-26 NOTE — Telephone Encounter (Signed)
This encounter was created in error - please disregard.

## 2015-08-31 ENCOUNTER — Encounter: Payer: Self-pay | Admitting: *Deleted

## 2015-08-31 ENCOUNTER — Encounter: Payer: Commercial Managed Care - HMO | Attending: Family Medicine | Admitting: *Deleted

## 2015-08-31 VITALS — BP 130/60 | Wt 173.6 lb

## 2015-08-31 DIAGNOSIS — E119 Type 2 diabetes mellitus without complications: Secondary | ICD-10-CM | POA: Diagnosis not present

## 2015-08-31 NOTE — Progress Notes (Signed)
Diabetes Self-Management Education  Visit Type:  Follow-up  Appt. Start Time: 0840 Appt. End Time: 1000  08/31/2015  Charles Sellers, identified by name and date of birth, is a 81 y.o. male with a diagnosis of Diabetes:  Type 2    ASSESSMENT  Blood pressure 130/60, weight 173 lb 9.6 oz (78.744 kg). Body mass index is 26.4 kg/(m^2).       Diabetes Self-Management Education - 21/30/86 5784    Complications   How often do you check your blood sugar? 1-2 times/day   Fasting Blood glucose range (mg/dL) 70-129;130-179  FBG's 111-157 mg/dL   Postprandial Blood glucose range (mg/dL) 130-179;180-200;>200  pp's 134-191 mg/dL with 3 readings > 200 mg/dL related to food choices - milkshake, corn bread   Have you had a dilated eye exam in the past 12 months? Yes   Have you had a dental exam in the past 12 months? Yes   Are you checking your feet? Yes   How many days per week are you checking your feet? 7   Dietary Intake   Breakfast sausage biscuit - 5 days/week; had 1 egg, 2 pieces of toast and glass of milk today   Snack (morning) same   Lunch fish sandwich with 1 piece of bread, sweet potato fries   Dinner beans and vegetables; salad   Snack (evening) 1 peanut butter cracker and milk   Beverage(s) water, diet soda and Gatorade   Exercise   Exercise Type ADL's  yard work, mowing, chopping wood - "busy outside from am until supper"   How many days per week to you exercise? 6   How many minutes per day do you exercise? 60   Total minutes per week of exercise 360   Patient Education   Disease state  Factors that contribute to the development of diabetes   Nutrition management  Food label reading, portion sizes and measuring food.;Carbohydrate counting;Reviewed blood glucose goals for pre and post meals and how to evaluate the patients' food intake on their blood glucose level.   Physical activity and exercise  Role of exercise on diabetes management, blood pressure control and cardiac  health.   Medications Reviewed patients medication for diabetes, action, purpose, timing of dose and side effects.   Monitoring Identified appropriate SMBG and/or A1C goals.   Chronic complications Assessed and discussed foot care and prevention of foot problems;Dental care   Psychosocial adjustment Identified and addressed patients feelings and concerns about diabetes   Individualized Goals (developed by patient)   Nutrition Follow meal plan discussed;General guidelines for healthy choices and portions discussed  try G2 instead of regular Gatorade after chopping wood; include protein choice with each meal   Physical Activity Exercise 5-7 days per week   Medications take my medication as prescribed   Monitoring  test my blood glucose as discussed  1 x day - either fasting or 2 hrs after a meal   Reducing Risk examine blood glucose patterns   Outcomes   Program Status Completed   Subsequent Visit   Since your last visit have you continued or begun to take your medications as prescribed? Yes  Pt has started on Metformin   Since your last visit have you had your blood pressure checked? Yes   Is your most recent blood pressure lower, unchanged, or higher since your last visit? Lower   Since your last visit have you experienced any weight changes? Loss   Weight Loss (lbs) 2.3   Since your last  visit, are you checking your blood glucose at least once a day? Yes      Learning Objective:  Patient will have a greater understanding of diabetes self-management. Patient education plan is to attend individual and/or group sessions per assessed needs and concerns.   Plan:  See above goals    Education material provided: Planning A Balanced Meal  If problems or questions, patient to contact team via:  Johny Drilling, RN, [place], CDE [phone]  Future DSME appointment: - Yearly

## 2015-10-18 ENCOUNTER — Encounter: Payer: Self-pay | Admitting: Family Medicine

## 2015-10-18 ENCOUNTER — Ambulatory Visit (INDEPENDENT_AMBULATORY_CARE_PROVIDER_SITE_OTHER): Payer: Commercial Managed Care - HMO | Admitting: Family Medicine

## 2015-10-18 VITALS — BP 130/76 | HR 73 | Temp 97.7°F | Resp 16 | Wt 172.4 lb

## 2015-10-18 DIAGNOSIS — I2510 Atherosclerotic heart disease of native coronary artery without angina pectoris: Secondary | ICD-10-CM

## 2015-10-18 DIAGNOSIS — Z9861 Coronary angioplasty status: Secondary | ICD-10-CM

## 2015-10-18 DIAGNOSIS — E785 Hyperlipidemia, unspecified: Secondary | ICD-10-CM | POA: Diagnosis not present

## 2015-10-18 DIAGNOSIS — M609 Myositis, unspecified: Secondary | ICD-10-CM

## 2015-10-18 DIAGNOSIS — I10 Essential (primary) hypertension: Secondary | ICD-10-CM

## 2015-10-18 DIAGNOSIS — E1121 Type 2 diabetes mellitus with diabetic nephropathy: Secondary | ICD-10-CM | POA: Diagnosis not present

## 2015-10-18 MED ORDER — ONETOUCH DELICA LANCETS FINE MISC: Status: DC

## 2015-10-18 MED ORDER — ONETOUCH ULTRA BLUE VI STRP: ORAL_STRIP | Status: DC

## 2015-10-18 NOTE — Progress Notes (Signed)
Patient ID: Charles Llanos., male   DOB: 1932/08/22, 81 y.o.   MRN: [id number]   Chief Complaint  Patient presents with  . Diabetes  . Follow-up    Subjective:  Diabetes He presents for his follow-up diabetic visit. He has type 2 diabetes mellitus. His disease course has been stable. There are no hypoglycemic associated symptoms. Pertinent negatives for diabetes include no blurred vision, no foot paresthesias, no foot ulcerations, no polydipsia and no visual change. Symptoms are stable. There are no diabetic complications. Risk factors for coronary artery disease include diabetes mellitus.  Has noticed an increase in muscle and joint pains since starting the Metformin in September 2016. Stopped it 2 weeks ago when soreness and pains in joints and muscles was at its peak. Added Aleve back and starting to feel some better. FBS averaging 120's - 140's without the Metformin. No hypoglycemic episodes or neuropathies. No change from usual urination pattern.   Prior to Admission medications   Medication Sig Start Date End Date Taking? Authorizing Provider  cholecalciferol (VITAMIN D) 1000 UNITS tablet Take 1,000 Units by mouth daily.   Yes Historical Provider, MD  clopidogrel (PLAVIX) 75 MG tablet Take 1 tablet (75 mg total) by mouth daily. 08/11/14  Yes Leonie Man, MD  metoprolol succinate (TOPROL-XL) 25 MG 24 hr tablet Take 0.5 tablets (12.5 mg total) by mouth daily. 01/27/15  Yes Leonie Man, MD  Multiple Vitamin (MULTIVITAMIN) tablet Take 1 tablet by mouth daily.   Yes Historical Provider, MD  naproxen sodium (ANAPROX) 220 MG tablet Take 220 mg by mouth as needed.   Yes Historical Provider, MD  nitroGLYCERIN (NITROSTAT) 0.4 MG SL tablet Place 0.4 mg under the tongue every 5 (five) minutes as needed for chest pain.   Yes Historical Provider, MD  ONE TOUCH ULTRA TEST test strip  07/01/15  Yes Historical Provider, MD  Jonetta Speak LANCETS FINE [place]  06/15/15  Yes Historical Provider, MD    pantoprazole (PROTONIX) 40 MG tablet TAKE 1 TABLET EVERY DAY Patient taking differently: TAKE 1 TABLET EVERY DAY - pt takes Mon, Wed, Fri 01/10/15  Yes Leonie Man, MD  ramipril (ALTACE) 5 MG capsule TAKE 1 CAPSULE EVERY DAY 01/10/15  Yes Leonie Man, MD  rosuvastatin (CRESTOR) 5 MG tablet Take 1 tablet (5 mg total) by mouth daily. 08/23/15  Yes Leonie Man, MD  metFORMIN (GLUCOPHAGE-XR) 500 MG 24 hr tablet Take 500 mg by mouth daily with breakfast.  04/19/15   Historical Provider, MD    Patient Active Problem List   Diagnosis Date Noted  . Atherosclerosis of coronary artery 06/22/2015  . History of prolonged Q-T interval on ECG 06/22/2015  . Prostate lump 06/22/2015  . 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 Diabetes mellitus, type 2 ([place]) 06/22/2015  . CAD S/P percutaneous coronary angioplasty   . Diabetes mellitus type II, controlled ([place])   . Dyslipidemia, goal LDL below 70   . Essential hypertension   . Arthropathia 02/17/2010  . Chondrocalcinosis of multiple sites 08/11/2008  . Acid reflux 08/11/2008  . Cannot sleep 07/23/2008  . UNSPECIFIED INFLAMMATORY POLYARTHROPATHY 07/20/2008  . MYOSITIS 07/20/2008  . FEVER UNSPECIFIED 07/20/2008  . EXANTHEM 07/20/2008  . Muscle ache 07/16/2008  . CAD in native artery 06/19/2008  . Disorder of sacrum 06/19/2008  . Thyrotoxicosis 06/19/2008    Past Medical History  Diagnosis Date  . CAD S/P percutaneous coronary angioplasty 12/13 & 15 2005    PCI - mRCA (Cypher DES  3.5 mm x 18 mm --> 4.45mm); RPL Cypher 2.5 mm x 13 mm;; mLAD Cy a pher DES 3.0 mm x 13 mm (3.5 mm) with Cutting PTCA of D2 ostium;; mid Cx 100% after small OM1.  . Diabetes mellitus type II, controlled ([place])     Diet controlled. No medications; he indicates "borderline diabetes"  . Dyslipidemia, goal LDL below 70      on statin  . Hypertension   . Ventral hernia   . Prostate nodule   . Atherosclerosis   . Cholecystitis   . Dermatitis   . CAD (coronary artery disease)  2011 & 2009    Echo, EF =>55% 2011/ Echo, EF =>55% 2009    Social History   Social History  . Marital Status: Married    Spouse Name: N/A  . Number of Children: N/A  . Years of Education: N/A   Occupational History  . Not on file.   Social History Main Topics  . Smoking status: Never Smoker   . Smokeless tobacco: Never Used  . Alcohol Use: No  . Drug Use: No  . Sexual Activity: Not on file   Other Topics Concern  . Not on file   Social History Narrative   Married father of 3, 9 grandchildren, now 33 great-grandchildren. 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   Using the toilet, but does not do routine exercise. Always on the go.   Never smoked. Does not drink alcohol.   Past Surgical History  Procedure Laterality Date  . Coronary angioplasty with stent placement      mRCA Cypher DES 3.5 mm x 18 mm (4.0), RPL Cypher DES 2.5 mm x 13 mm; mLAD 3.0 mm x 13 mm Cypher -> cutting PTCA of jailed D2; previousl PCI of Cx - 100% mid Cx.  Marland Kitchen Appendectomy    . Nm myoview ltd  06/2012    Small area of basal inferior infarct with no ischemia. Normal EF  . Transthoracic echocardiogram  February 2014     EF 55%. Mild-moderate MR  . Carotid dopplers  September 2014    Bilateral internal carotids < 49%.  . Stress myoview  2013    normal findings  . Cardiac catheterization  2005   Family History  Problem Relation Age of Onset  . Stroke Mother   . Liver cancer Father   . Diabetes Father   . Esophageal cancer Brother   . Cerebral aneurysm Daughter   . Melanoma Daughter   . Cerebral aneurysm Son     Allergies  Allergen Reactions  . Niaspan [Niacin Er] Rash  . Penicillins Rash    Review of Systems  Constitutional: Negative.   HENT: Negative.   Eyes: Negative.  Negative for blurred vision.  Respiratory: Negative.   Cardiovascular: Negative.     Gastrointestinal: Negative.   Genitourinary: Negative.   Musculoskeletal: Positive for myalgias and joint pain.  Skin: Negative.   Neurological: Negative.   Endo/Heme/Allergies: Negative.  Negative for polydipsia.  Psychiatric/Behavioral: Negative.     Objective:  BP 130/76 mmHg  Pulse 73  Temp(Src) 97.7 F (36.5 C) (Oral)  Resp 16  Wt 172 lb 6.4 oz (78.2 kg)  SpO2 99%  Physical Exam  Constitutional: He is oriented to person, place, and time and well-developed, well-nourished, and in no distress.  HENT:  Head: Normocephalic.  Eyes: Conjunctivae are normal.  Neck: Normal range of motion. Neck supple.  Cardiovascular: Normal rate and regular rhythm.   Pulmonary/Chest: Effort normal.  Abdominal: Bowel sounds are normal.  Musculoskeletal:  Generalized stiffness and soreness in upper arms, thighs, hips, knees and hands. [place]ber[place]'s nodes of both index fingers and prominent joints in multiple fingers. No crepitus in knees.  Neurological: He is alert and oriented to person, place, and time.  Psychiatric: Affect and judgment normal.    CMP     Component Value Date/Time   NA 141 07/26/2015 1048   NA 143 07/20/2008 0000   K 5.0 07/26/2015 1048   CL 98 07/26/2015 1048   CO2 27 07/26/2015 1048   GLUCOSE 142* 07/26/2015 1048   GLUCOSE 116* 07/20/2008 0000   BUN 15 07/26/2015 1048   BUN 14 07/20/2008 0000   CREATININE 0.72* 07/26/2015 1048   CALCIUM 9.0 07/26/2015 1048   PROT 7.1 07/26/2015 1048   PROT 6.5 06/06/2008 2020   ALBUMIN 4.2 07/26/2015 1048   ALBUMIN 2.7* 06/06/2008 1215   AST 18 07/26/2015 1048   ALT 21 07/26/2015 1048   ALKPHOS 71 07/26/2015 1048   BILITOT 0.6 07/26/2015 1048   BILITOT 1.1 06/06/2008 1215   GFRNONAA 87 07/26/2015 1048   GFRAA 101 07/26/2015 1048    Assessment and Plan :  1. Controlled type 2 diabetes mellitus with diabetic nephropathy, without long-term current use of insulin (HCC) Stopped Metformin as he associated muscle and joint  pains with it. Has been trying to follow diabetic diet. Encouraged to exercise some and continue follow up of FBS daily. Recheck labs and follow up pending reports. May need a different diabetes medication. - Hemoglobin A1c - Lipid panel - Comprehensive metabolic panel - CBC with Differential/Platelet - ONETOUCH DELICA LANCETS FINE MISC; One finger stick daily for fasting blood sugar check.  Dispense: 100 each; Refill: 3 - ONE TOUCH ULTRA TEST test strip; Check fasting blood sugar once a day  Dispense: 100 each; Refill: 3  2. Myositis Will check CRP and CK with muscle pains over the past couple weeks. Has been on Crestor since May 2016 without issues until now.  - CK (Creatine Kinase) - C-reactive protein  3. Essential hypertension Stable BP on ACE and beta blocker. Tolerating well without side effects.  4. CAD S/P percutaneous coronary angioplasty Stable without recent chest pain, palpitations or dyspnea. Has not had to use NTG recently.  5. Hyperlipidemia Tolerating Crestor. Unsure if recent muscle and joint discomfort is related to the Crestor or Metformin. Continue fat restrictions and encouraged exercise. Will recheck pending lab reports.    [facility] 10/18/2015 8:18 AM

## 2015-10-19 LAB — COMPREHENSIVE METABOLIC PANEL
A/G RATIO: 1.6 (ref 1.1–2.5)
ALBUMIN: 4.4 g/dL (ref 3.5–4.7)
ALK PHOS: 78 IU/L (ref 39–117)
ALT: 19 IU/L (ref 0–44)
AST: 16 IU/L (ref 0–40)
BILIRUBIN TOTAL: 0.7 mg/dL (ref 0.0–1.2)
BUN / CREAT RATIO: 19 (ref 10–22)
BUN: 12 mg/dL (ref 8–27)
CO2: 25 mmol/L (ref 18–29)
Calcium: 9.3 mg/dL (ref 8.6–10.2)
Chloride: 99 mmol/L (ref 97–106)
Creatinine, Ser: 0.62 mg/dL — ABNORMAL LOW (ref 0.76–1.27)
GFR calc Af Amer: 108 mL/min/{1.73_m2} (ref >59)
GFR calc non Af Amer: 93 mL/min/{1.73_m2} (ref >59)
GLOBULIN, TOTAL: 2.7 g/dL (ref 1.5–4.5)
Glucose: 137 mg/dL — ABNORMAL HIGH (ref 65–99)
POTASSIUM: 4.4 mmol/L (ref 3.5–5.2)
SODIUM: 139 mmol/L (ref 136–144)
Total Protein: 7.1 g/dL (ref 6.0–8.5)

## 2015-10-19 LAB — LIPID PANEL
CHOLESTEROL TOTAL: 132 mg/dL (ref 100–199)
Chol/HDL Ratio: 3.5 ratio units (ref 0.0–5.0)
HDL: 38 mg/dL — AB (ref >39)
LDL CALC: 84 mg/dL (ref 0–99)
TRIGLYCERIDES: 50 mg/dL (ref 0–149)
VLDL CHOLESTEROL CAL: 10 mg/dL (ref 5–40)

## 2015-10-19 LAB — CBC WITH DIFFERENTIAL/PLATELET
Basophils Absolute: 0 10*3/uL (ref 0.0–0.2)
Basos: 0 %
EOS (ABSOLUTE): 0.1 10*3/uL (ref 0.0–0.4)
EOS: 2 %
HEMATOCRIT: 43.2 % (ref 37.5–51.0)
Hemoglobin: 14.9 g/dL (ref 12.6–17.7)
IMMATURE GRANULOCYTES: 0 %
Immature Grans (Abs): 0 10*3/uL (ref 0.0–0.1)
Lymphocytes Absolute: 1.2 10*3/uL (ref 0.7–3.1)
Lymphs: 17 %
MCH: 29.2 pg (ref 26.6–33.0)
MCHC: 34.5 g/dL (ref 31.5–35.7)
MCV: 85 fL (ref 79–97)
MONOS ABS: 0.7 10*3/uL (ref 0.1–0.9)
Monocytes: 11 %
NEUTROS PCT: 70 %
Neutrophils Absolute: 4.8 10*3/uL (ref 1.4–7.0)
PLATELETS: 197 10*3/uL (ref 150–379)
RBC: 5.11 x10E6/uL (ref 4.14–5.80)
RDW: 13.4 % (ref 12.3–15.4)
WBC: 6.8 10*3/uL (ref 3.4–10.8)

## 2015-10-19 LAB — HEMOGLOBIN A1C
ESTIMATED AVERAGE GLUCOSE: 146 mg/dL
Hgb A1c MFr Bld: 6.7 % — ABNORMAL HIGH (ref 4.8–5.6)

## 2015-10-19 LAB — CK: Total CK: 96 U/L (ref 24–204)

## 2015-10-19 LAB — C-REACTIVE PROTEIN: CRP: 5.9 mg/L — AB (ref 0.0–4.9)

## 2015-10-21 ENCOUNTER — Telehealth: Payer: Self-pay | Admitting: Family Medicine

## 2015-10-21 NOTE — Telephone Encounter (Signed)
Pt wife, Shirlee Limerick is returning call.  CB#[phone]/MW

## 2015-10-21 NOTE — Telephone Encounter (Signed)
Called back no answer, see lab result message-aa

## 2015-10-24 ENCOUNTER — Telehealth: Payer: Self-pay | Admitting: Family Medicine

## 2015-10-24 NOTE — Telephone Encounter (Signed)
Left message to call back  

## 2015-10-24 NOTE — Telephone Encounter (Signed)
Pt states he is returning a call to [place], pt states he would like for her to call him back, pt states if he isn't available  to come to the phone then Lexine Baton can talk with pt's wife. CB# [phone] [facility]

## 2015-10-24 NOTE — Telephone Encounter (Signed)
-----   Message from Margo Common, [place] sent at 10/20/2015  1:34 PM EST ----- The Hgb A1C improved with the short use of the Metformin (goal is to keep it below 7.0). Cholesterol in good shape. CRP indicates inflammation is up. CK is normal and does not show signs of muscle damage. Probably an arthritic inflammation issue. Recommend using Aleve BID regularly for 2 weeks to be sure inflammation resolves. Recheck diabetes in 3 months.

## 2015-10-24 NOTE — Telephone Encounter (Signed)
LMTCB

## 2015-10-25 NOTE — Telephone Encounter (Signed)
Patient advised as directed below. Patient verbalized understanding and agrees with treatment plan. Patient has a follow up appointment scheduled.  

## 2015-10-25 NOTE — Telephone Encounter (Signed)
Pt's wife returned call. I made note on the other telephone encounter and am closing this encounter so there is only one open. Thanks TNP

## 2015-10-25 NOTE — Telephone Encounter (Signed)
Pt's wife returned call. Thanks TNP °

## 2015-11-01 ENCOUNTER — Other Ambulatory Visit: Payer: Self-pay | Admitting: Cardiology

## 2015-11-21 ENCOUNTER — Telehealth: Payer: Self-pay | Admitting: *Deleted

## 2015-11-21 ENCOUNTER — Other Ambulatory Visit: Payer: Self-pay | Admitting: Cardiology

## 2015-11-21 MED ORDER — CLOPIDOGREL BISULFATE 75 MG PO TABS: 75.0000 mg | ORAL_TABLET | Freq: Every day | ORAL | Status: DC

## 2015-11-21 NOTE — Telephone Encounter (Signed)
Refill sent for Plavix 75 mg

## 2015-11-21 NOTE — Telephone Encounter (Signed)
°*  STAT* If patient is at the pharmacy, call can be transferred to refill team.   1. Which medications need to be refilled? (please list name of each medication and dose if known) Clopidogrel Bisulfate  2. Which pharmacy/location (including street and city if local pharmacy) is medication to be sent to? [facility]   3. Do they need a 30 day or 90 day supply? 90 day

## 2015-11-22 ENCOUNTER — Encounter: Payer: Self-pay | Admitting: Family Medicine

## 2015-11-22 ENCOUNTER — Ambulatory Visit (INDEPENDENT_AMBULATORY_CARE_PROVIDER_SITE_OTHER): Payer: Commercial Managed Care - HMO | Admitting: Family Medicine

## 2015-11-22 ENCOUNTER — Ambulatory Visit
Admission: RE | Admit: 2015-11-22 | Discharge: 2015-11-22 | Disposition: A | Discharge status: Home / Self Care | Payer: Commercial Managed Care - HMO | Source: Ambulatory Visit | Attending: Family Medicine | Admitting: Family Medicine

## 2015-11-22 VITALS — BP 138/70 | HR 76 | Temp 97.9°F | Resp 16 | Wt 173.4 lb

## 2015-11-22 DIAGNOSIS — M25512 Pain in left shoulder: Secondary | ICD-10-CM | POA: Insufficient documentation

## 2015-11-22 DIAGNOSIS — M791 Myalgia, unspecified site: Secondary | ICD-10-CM

## 2015-11-22 DIAGNOSIS — M129 Arthropathy, unspecified: Secondary | ICD-10-CM | POA: Diagnosis not present

## 2015-11-22 MED ORDER — DICLOFENAC SODIUM 75 MG PO TBEC: 75.0000 mg | DELAYED_RELEASE_TABLET | Freq: Two times a day (BID) | ORAL | Status: DC

## 2015-11-22 NOTE — Progress Notes (Signed)
Patient ID: Charles Llanos., male   DOB: 1932/08/24, 81 y.o.   MRN: [id number]   Patient: Charles Sellers. Male    DOB: 1932/02/21   82 y.o.   MRN: [id number] Visit Date: 11/22/2015  Today's Provider: [name], PA   Chief Complaint  Patient presents with  . Shoulder Pain    bilateral shoulder pain X 6 weeks   Subjective:    Shoulder Pain  The pain is present in the left shoulder, right shoulder and back. This is a new problem. The current episode started more than 1 month ago. The problem occurs constantly. The problem has been gradually worsening (worse at rest). The quality of the pain is described as aching and sharp (stabbing sensation). The pain is moderate. The symptoms are aggravated by activity and lying down (Feels it started when he was on the Metformin.). He has tried NSAIDS and OTC pain meds for the symptoms.   Patient Active Problem List   Diagnosis Date Noted  . Atherosclerosis of coronary artery 06/22/2015  . History of prolonged Q-T interval on ECG 06/22/2015  . Prostate lump 06/22/2015  . 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 Diabetes mellitus, type 2 ([place]) 06/22/2015  . CAD S/P percutaneous coronary angioplasty   . Diabetes mellitus type II, controlled ([place])   . Dyslipidemia, goal LDL below 70   . Essential hypertension   . Arthropathia 02/17/2010  . Chondrocalcinosis of multiple sites 08/11/2008  . Acid reflux 08/11/2008  . Cannot sleep 07/23/2008  . UNSPECIFIED INFLAMMATORY POLYARTHROPATHY 07/20/2008  . MYOSITIS 07/20/2008  . FEVER UNSPECIFIED 07/20/2008  . EXANTHEM 07/20/2008  . Muscle ache 07/16/2008  . CAD in native artery 06/19/2008  . Disorder of sacrum 06/19/2008  . Thyrotoxicosis 06/19/2008   Past Surgical History  Procedure Laterality Date  . Coronary angioplasty with stent placement      mRCA Cypher DES 3.5 mm x 18 mm (4.0), RPL Cypher DES 2.5 mm x 13 mm; mLAD 3.0 mm x 13 mm Cypher -> cutting PTCA of jailed D2; previousl PCI of Cx - 100% mid  Cx.  Marland Kitchen Appendectomy    . Nm myoview ltd  06/2012    Small area of basal inferior infarct with no ischemia. Normal EF  . Transthoracic echocardiogram  February 2014     EF 55%. Mild-moderate MR  . Carotid dopplers  September 2014    Bilateral internal carotids < 49%.  . Stress myoview  2013    normal findings  . Cardiac catheterization  2005   Family History  Problem Relation Age of Onset  . Stroke Mother   . Liver cancer Father   . Diabetes Father   . Esophageal cancer Brother   . Cerebral aneurysm Daughter   . Melanoma Daughter   . Cerebral aneurysm Son       Allergies  Allergen Reactions  . Niaspan [Niacin Er] Rash  . Penicillins Rash   Previous Medications   CHOLECALCIFEROL (VITAMIN D) 1000 UNITS TABLET    Take 1,000 Units by mouth daily.   CLOPIDOGREL (PLAVIX) 75 MG TABLET    Take 1 tablet (75 mg total) by mouth daily.   METOPROLOL SUCCINATE (TOPROL-XL) 25 MG 24 HR TABLET    Take 0.5 tablets (12.5 mg total) by mouth daily.   MULTIPLE VITAMIN (MULTIVITAMIN) TABLET    Take 1 tablet by mouth daily.   NAPROXEN SODIUM (ANAPROX) 220 MG TABLET    Take 220 mg by mouth as needed.   NITROGLYCERIN (NITROSTAT)  0.4 MG SL TABLET    Place 0.4 mg under the tongue every 5 (five) minutes as needed for chest pain.   ONE TOUCH ULTRA TEST TEST STRIP    Check fasting blood sugar once a day   ONETOUCH DELICA LANCETS FINE MISC    One finger stick daily for fasting blood sugar check.   PANTOPRAZOLE (PROTONIX) 40 MG TABLET    TAKE 1 TABLET EVERY DAY   RAMIPRIL (ALTACE) 5 MG CAPSULE    TAKE 1 CAPSULE EVERY DAY   ROSUVASTATIN (CRESTOR) 5 MG TABLET    Take 1 tablet (5 mg total) by mouth daily.    Review of Systems  Constitutional: Negative.   HENT: Negative.   Eyes: Negative.   Respiratory: Negative.   Cardiovascular: Negative.   Gastrointestinal: Negative.   Endocrine: Negative.   Genitourinary: Negative.   Musculoskeletal: Positive for myalgias and arthralgias.  Skin: Negative.     Allergic/Immunologic: Negative.   Neurological: Negative.   Hematological: Negative.   Psychiatric/Behavioral: Negative.     Social History  Substance Use Topics  . Smoking status: Never Smoker   . Smokeless tobacco: Never Used  . Alcohol Use: No   Objective:   BP 138/70 mmHg  Pulse 76  Temp(Src) 97.9 F (36.6 C) (Oral)  Resp 16  Wt 173 lb 6.4 oz (78.654 kg)  SpO2 96%  Physical Exam  Constitutional: He is oriented to person, place, and time. He appears well-developed and well-nourished.  HENT:  Head: Normocephalic.  Eyes: Conjunctivae and EOM are normal.  Neck: Normal range of motion. Neck supple.  Cardiovascular: Normal rate and regular rhythm.   Pulmonary/Chest: Effort normal and breath sounds normal.  Abdominal: Soft. Bowel sounds are normal.  Musculoskeletal: He exhibits no tenderness.  Heber[place]'s nodes in fingers and some enlargement of PIP joints in fingers. Pain sharp in left shoulder to reach over head. Fair strength through out all extremities. No swelling or redness. Pulses are 2+ and symmetric. No joint crepitus noted. Some back stiffness and ache.  Neurological: He is alert and oriented to person, place, and time.  Skin: No rash noted.      Assessment & Plan:     1. Left shoulder pain Onset over the past couple weeks. Admits to "cutting wood" a great deal recently (heavy lifting and pulling activities). May apply moist heat or use Aspercreme with Lidocaine prn. Given Diclofenac and stop the Aleve. Will get labs and x-ray evaluation. May need orthopedic referral if no better in 7-10 days. - diclofenac (VOLTAREN) 75 MG EC tablet; Take 1 tablet (75 mg total) by mouth 2 (two) times daily.  Dispense: 30 tablet; Refill: 0 - DG Shoulder Left  2. Muscle ache Onset over the past 4-6 weeks. Associates it with the use of Metformin but has been off it for the past 4-6 weeks. Will get labs to rule out arthritis/connective tissue disorder.  - C-reactive protein  3.  Arthropathia Concerned about possible arthritis like his parents. Having back ache and stiffness with shoulder and muscle pains. - Uric acid - Rheumatoid Factor - CBC with Differential/Platelet

## 2015-11-22 NOTE — Patient Instructions (Signed)

## 2015-11-23 ENCOUNTER — Telehealth: Payer: Self-pay

## 2015-11-23 LAB — CBC WITH DIFFERENTIAL/PLATELET
BASOS: 0 %
Basophils Absolute: 0 10*3/uL (ref 0.0–0.2)
EOS (ABSOLUTE): 0.2 10*3/uL (ref 0.0–0.4)
Eos: 2 %
Hematocrit: 43.7 % (ref 37.5–51.0)
Hemoglobin: 14.8 g/dL (ref 12.6–17.7)
IMMATURE GRANS (ABS): 0 10*3/uL (ref 0.0–0.1)
Immature Granulocytes: 0 %
LYMPHS: 17 %
Lymphocytes Absolute: 1.7 10*3/uL (ref 0.7–3.1)
MCH: 28.9 pg (ref 26.6–33.0)
MCHC: 33.9 g/dL (ref 31.5–35.7)
MCV: 85 fL (ref 79–97)
MONOS ABS: 1.1 10*3/uL — AB (ref 0.1–0.9)
Monocytes: 11 %
NEUTROS ABS: 6.8 10*3/uL (ref 1.4–7.0)
Neutrophils: 70 %
PLATELETS: 206 10*3/uL (ref 150–379)
RBC: 5.12 x10E6/uL (ref 4.14–5.80)
RDW: 13.7 % (ref 12.3–15.4)
WBC: 9.7 10*3/uL (ref 3.4–10.8)

## 2015-11-23 LAB — RHEUMATOID FACTOR

## 2015-11-23 LAB — C-REACTIVE PROTEIN: CRP: 5.7 mg/L — ABNORMAL HIGH (ref 0.0–4.9)

## 2015-11-23 LAB — URIC ACID: URIC ACID: 4.9 mg/dL (ref 3.7–8.6)

## 2015-11-23 NOTE — Telephone Encounter (Signed)
LMTCB

## 2015-11-23 NOTE — Telephone Encounter (Signed)
-----   Message from Margo Common, [place] sent at 11/22/2015  6:03 PM EST ----- No sign of bony abnormality. Awaiting lab reports.

## 2015-11-24 NOTE — Telephone Encounter (Signed)
LMTCB

## 2015-11-25 NOTE — Telephone Encounter (Signed)
-----   Message from Margo Common, [place] sent at 11/24/2015  4:34 PM EST ----- No sign of infection or rheumatoid arthritis. X-ray essentially normal. If no better with use of Diclofenac over the next week, will need referral to rheumatologist. CRP slightly elevated indicating inflammation (but not as much as in the past).

## 2015-11-25 NOTE — Telephone Encounter (Signed)
Patient advised as directed below. Patient verbalized understanding and agrees with plan of care.  

## 2015-11-30 ENCOUNTER — Other Ambulatory Visit: Payer: Self-pay | Admitting: Cardiology

## 2015-12-01 NOTE — Telephone Encounter (Signed)
Rx(s) sent to pharmacy electronically.  

## 2016-01-09 ENCOUNTER — Ambulatory Visit (INDEPENDENT_AMBULATORY_CARE_PROVIDER_SITE_OTHER): Payer: PPO | Admitting: Family Medicine

## 2016-01-09 ENCOUNTER — Encounter: Payer: Self-pay | Admitting: Family Medicine

## 2016-01-09 VITALS — BP 124/62 | HR 68 | Temp 97.6°F | Resp 14 | Ht 66.25 in | Wt 171.2 lb

## 2016-01-09 DIAGNOSIS — Z0000 Encounter for general adult medical examination without abnormal findings: Secondary | ICD-10-CM | POA: Diagnosis not present

## 2016-01-09 DIAGNOSIS — Z9861 Coronary angioplasty status: Secondary | ICD-10-CM | POA: Diagnosis not present

## 2016-01-09 DIAGNOSIS — Z23 Encounter for immunization: Secondary | ICD-10-CM

## 2016-01-09 DIAGNOSIS — N4289 Other specified disorders of prostate: Secondary | ICD-10-CM

## 2016-01-09 DIAGNOSIS — N429 Disorder of prostate, unspecified: Secondary | ICD-10-CM

## 2016-01-09 DIAGNOSIS — I2510 Atherosclerotic heart disease of native coronary artery without angina pectoris: Secondary | ICD-10-CM | POA: Diagnosis not present

## 2016-01-09 LAB — POCT URINALYSIS DIPSTICK
Bilirubin, UA: NEGATIVE
Blood, UA: NEGATIVE
Glucose, UA: NEGATIVE
Ketones, UA: NEGATIVE
LEUKOCYTES UA: NEGATIVE
Nitrite, UA: NEGATIVE
PROTEIN UA: NEGATIVE
Spec Grav, UA: <=1.005
UROBILINOGEN UA: 0.2
pH, UA: 6.5

## 2016-01-09 NOTE — Patient Instructions (Signed)

## 2016-01-09 NOTE — Progress Notes (Signed)
Patient ID: Janyce Llanos., Charles Sellers   DOB: 1932/09/22, 82 y.o.   MRN: [id number] Patient: Charles Metter., Charles Sellers    DOB: 05/16/1932, 82 y.o.   MRN: [id number] Visit Date: 01/09/2016  Today's Provider: [name], PA   Chief Complaint  Patient presents with  . Medicare Wellness   Subjective:    Annual wellness visit Charles Badia. is a 82 y.o. Charles Sellers who presents today for his Subsequent Annual Wellness Visit. He feels fairly well. He reports exercising daily- cutting wood. He reports he is sleeping well (average 9 hours per night).  -----------------------------------------------------------   Review of Systems  Constitutional: Negative.   HENT: Negative.   Eyes: Negative.   Respiratory: Negative.   Cardiovascular: Negative.   Gastrointestinal: Negative.   Endocrine: Negative.   Genitourinary: Negative.   Musculoskeletal: Negative.   Skin: Negative.   Allergic/Immunologic: Negative.   Neurological: Negative.   Hematological: Bruises/bleeds easily.  Psychiatric/Behavioral: Negative.     Social History   Social History  . Marital Status: Married    Spouse Name: N/A  . Number of Children: N/A  . Years of Education: N/A   Occupational History  . Not on file.   Social History Main Topics  . Smoking status: Never Smoker   . Smokeless tobacco: Never Used  . Alcohol Use: No  . Drug Use: No  . Sexual Activity: Not on file   Other Topics Concern  . Not on file   Social History Narrative   Married father of 3, 9 grandchildren, now 50 great-grandchildren. 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   Using the toilet, but does not do routine exercise. Always on the go.   Never smoked. Does not drink alcohol.    Patient Active Problem List   Diagnosis Date Noted  . Atherosclerosis of coronary artery 06/22/2015  . History of prolonged Q-T interval  on ECG 06/22/2015  . Prostate lump 06/22/2015  . 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 Diabetes mellitus, type 2 ([facility]) 06/22/2015  . CAD S/P percutaneous coronary angioplasty   . Diabetes mellitus type II, controlled ([facility])   . Dyslipidemia, goal LDL below 70   . Essential hypertension   . Arthropathia 02/17/2010  . Chondrocalcinosis of multiple sites 08/11/2008  . Acid reflux 08/11/2008  . Cannot sleep 07/23/2008  . UNSPECIFIED INFLAMMATORY POLYARTHROPATHY 07/20/2008  . MYOSITIS 07/20/2008  . FEVER UNSPECIFIED 07/20/2008  . EXANTHEM 07/20/2008  . Muscle ache 07/16/2008  . CAD in native artery 06/19/2008  . Disorder of sacrum 06/19/2008  . Thyrotoxicosis 06/19/2008    Past Surgical History  Procedure Laterality Date  . Coronary angioplasty with stent placement      mRCA Cypher DES 3.5 mm x 18 mm (4.0), RPL Cypher DES 2.5 mm x 13 mm; mLAD 3.0 mm x 13 mm Cypher -> cutting PTCA of jailed D2; previousl PCI of Cx - 100% mid Cx.  Marland Kitchen Appendectomy    . Nm myoview ltd  06/2012    Small area of basal inferior infarct with no ischemia. Normal EF  . Transthoracic echocardiogram  February 2014     EF 55%. Mild-moderate MR  . Carotid dopplers  September 2014    Bilateral internal carotids < 49%.  . Stress myoview  2013    normal findings  . Cardiac catheterization  2005    His family history includes  Cerebral aneurysm in his daughter and son; Diabetes in his father; Esophageal cancer in his brother; Liver cancer in his father; Melanoma in his daughter; Stroke in his mother.    Previous Medications   CHOLECALCIFEROL (VITAMIN D) 1000 UNITS TABLET    Take 1,000 Units by mouth daily.   CLOPIDOGREL (PLAVIX) 75 MG TABLET    Take 1 tablet (75 mg total) by mouth daily.   METOPROLOL SUCCINATE (TOPROL-XL) 25 MG 24 HR TABLET    Take 0.5 tablets (12.5 mg total) by mouth daily.   MULTIPLE VITAMIN (MULTIVITAMIN) TABLET    Take 1 tablet by mouth daily.   NAPROXEN SODIUM (ANAPROX) 220 MG TABLET    Take 220 mg  by mouth as needed.   NITROGLYCERIN (NITROSTAT) 0.4 MG SL TABLET    Place 0.4 mg under the tongue every 5 (five) minutes as needed for chest pain.   ONE TOUCH ULTRA TEST TEST STRIP    Check fasting blood sugar once a day   ONETOUCH DELICA LANCETS FINE MISC    One finger stick daily for fasting blood sugar check.   PANTOPRAZOLE (PROTONIX) 40 MG TABLET    TAKE 1 TABLET EVERY DAY   RAMIPRIL (ALTACE) 5 MG CAPSULE    TAKE 1 CAPSULE EVERY DAY   ROSUVASTATIN (CRESTOR) 5 MG TABLET    Take 1 tablet (5 mg total) by mouth daily.    Patient Care Team: Margo Common, PA as PCP - General (Physician Assistant)     Objective:   Vitals: BP 124/62 mmHg  Pulse 68  Temp(Src) 97.6 F (36.4 C) (Oral)  Resp 14  Ht 5' 6.25" (1.683 m)  Wt 171 lb 3.2 oz (77.656 kg)  BMI 27.42 kg/m2  SpO2 97%  Physical Exam  Constitutional: He is oriented to person, place, and time. He appears well-developed and well-nourished.  HENT:  Head: Normocephalic.  Right Ear: External ear normal.  Nose: Nose normal.  Mouth/Throat: Oropharynx is clear and moist.  Eyes: Conjunctivae and EOM are normal. Pupils are equal, round, and reactive to light.  Neck: Neck supple. No thyromegaly present.  Cardiovascular: Normal rate, regular rhythm, normal heart sounds and intact distal pulses.   Pulmonary/Chest: Effort normal and breath sounds normal.  Abdominal: Soft. Bowel sounds are normal.  Genitourinary: Rectum normal. Guaiac negative stool.  Prostate enlarged right lobe with hardness.  Musculoskeletal: He exhibits no edema.  Arthritic changes in fingers of each hand. Very stiff hips with limited rotational ROM. Stiffness in spine.  Lymphadenopathy:    He has no cervical adenopathy.  Neurological: He is alert and oriented to person, place, and time. He has normal reflexes.  Skin: Skin is warm and dry.  Psychiatric: He has a normal mood and affect. His behavior is normal. Judgment and thought content normal.    Activities of  Daily Living Able to accomplish all tasks without issues (clothing, bathing, eating, etc.).  Fall Risk Assessment Fall Risk  08/31/2015 08/03/2015  Falls in the past year? No No     Depression Screen PHQ 2/9 Scores 08/03/2015  PHQ - 2 Score 0    Cognitive Testing - 6-CIT  Correct? Score   What year is it? yes 0 0 or 4  What month is it? yes 0 0 or 3  Memorize:    Pia Mau,  44,  [place],      What time is it? (within 1 hour) yes 0 0 or 3  Count backwards from 20 yes  0 0, 2, or 4  Name the months of the year yes 0 0, 2, or 4  Repeat name & address above yes 1 0, 2, 4, 6, 8, or 10       TOTAL SCORE  1/28   Interpretation:  Normal  Normal (0-7) Abnormal (8-28)   Assessment & Plan:     Annual Wellness Visit  Reviewed patient's Family Medical History Reviewed and updated list of patient's medical providers Assessment of cognitive impairment was done Assessed patient's functional ability Established a written schedule for health screening [place] Completed and Reviewed  Exercise Activities and Dietary recommendations Goals    No specific exercise program. 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     Health Maintenance  Topic Date Due  . FOOT EXAM  03/19/1944  . OPHTHALMOLOGY EXAM  03/19/1944  . TETANUS/TDAP  03/19/1953  . ZOSTAVAX  03/19/1994  . PNA vac Low Risk Adult (1 of 2 - PCV13) 03/20/1999  . INFLUENZA VACCINE  07/11/2015  . HEMOGLOBIN A1C  04/16/2016      Discussed health benefits of physical activity, and encouraged him to engage in regular exercise appropriate for his age and condition.    ------------------------------------------------------------------------------------------------------------ 1. Annual physical exam General health stable. Refuses colonoscopy. Will up date immunizations. Given anticipatory guidance. Last ophthalmology exam June 2016. - POCT urinalysis dipstick  2. Need for  Streptococcus pneumoniae vaccination - Pneumococcal conjugate vaccine 13-valent  3. Need for tetanus booster - Tdap vaccine greater than or equal to 7yo IM  4. Prostate lump History of urology evaluation in 2014 by Dr. Ernst Spell. Will recheck PSA with persistent hard nodule in the right lobe. - PSA  5. CAD S/P percutaneous coronary angioplasty History of 4 stents in September 2005 and followed annually by Dr. Ellyn Hack. Denies palpitations or chest pains.

## 2016-01-10 LAB — PSA: PROSTATE SPECIFIC AG, SERUM: 3.4 ng/mL (ref 0.0–4.0)

## 2016-01-11 ENCOUNTER — Telehealth: Payer: Self-pay

## 2016-01-11 NOTE — Telephone Encounter (Signed)
-----   Message from Margo Common, [place] sent at 01/10/2016  5:41 PM EST ----- Normal PSA. If continuing to have problems with urinating frequently or getting up at night, will need to recheck with urologist. May need the to do test on urine flow rate and be sure gland does not need extra treatment.

## 2016-01-11 NOTE — Telephone Encounter (Signed)
LMTCB

## 2016-01-20 ENCOUNTER — Ambulatory Visit: Payer: Commercial Managed Care - HMO | Admitting: Family Medicine

## 2016-01-25 ENCOUNTER — Telehealth: Payer: Self-pay | Admitting: Family Medicine

## 2016-01-25 DIAGNOSIS — E1121 Type 2 diabetes mellitus with diabetic nephropathy: Secondary | ICD-10-CM

## 2016-01-25 NOTE — Telephone Encounter (Signed)
Pt contacted office for refill request on the following medications:  ONE TOUCH ULTRA TEST test strip.  [facility].  CB#[phone]/MW

## 2016-01-27 MED ORDER — ONETOUCH ULTRA BLUE VI STRP: ORAL_STRIP | Status: DC

## 2016-01-27 MED ORDER — ONETOUCH DELICA LANCETS FINE MISC: Status: DC

## 2016-03-02 ENCOUNTER — Ambulatory Visit (INDEPENDENT_AMBULATORY_CARE_PROVIDER_SITE_OTHER): Payer: PPO | Admitting: Family Medicine

## 2016-03-02 ENCOUNTER — Encounter: Payer: Self-pay | Admitting: Family Medicine

## 2016-03-02 VITALS — BP 136/62 | HR 56 | Temp 97.7°F | Resp 14 | Wt 174.2 lb

## 2016-03-02 DIAGNOSIS — E1121 Type 2 diabetes mellitus with diabetic nephropathy: Secondary | ICD-10-CM | POA: Diagnosis not present

## 2016-03-02 DIAGNOSIS — E785 Hyperlipidemia, unspecified: Secondary | ICD-10-CM | POA: Diagnosis not present

## 2016-03-02 DIAGNOSIS — I10 Essential (primary) hypertension: Secondary | ICD-10-CM

## 2016-03-02 NOTE — Progress Notes (Signed)
Patient ID: Charles Sellers., male   DOB: [birth year], 82 y.o.   MRN: [id number]   Patient: Charles Sellers. Male    DOB: [birth year]   82 y.o.   MRN: [id number] Visit Date: 03/02/2016  Today's Provider: [name], PA   Chief Complaint  Patient presents with  . Hyperlipidemia  . Hypertension  . Diabetes  . Follow-up   Subjective:    HPI  Diabetes Mellitus Type II, Follow-up:   Lab Results  Component Value Date   HGBA1C 6.7* 10/18/2015   HGBA1C 7.3* 07/26/2015   Last seen for diabetes 3 months ago.  Management since then includes none. He reports good compliance with treatment. He is not having side effects.  Current symptoms include none and have been stable. Home blood sugar records: 130-140's  Episodes of hypoglycemia? no   Lab Results  Component Value Date   HGBA1C 6.7* 10/18/2015   Weight trend: stable Current diet: diabetic Current exercise: cutting wood daily  ------------------------------------------------------------------------   Hypertension, follow-up:  BP Readings from Last 3 Encounters:  03/02/16 136/62  01/09/16 124/62  11/22/15 138/70    He was last seen for hypertension 3 months ago.  BP at that visit was 124/62. Management since that visit includes none .He reports good compliance with treatment. He is not having side effects.  He is exercising. He is adherent to low salt diet.   Outside blood pressures are not being checked. He is experiencing none.  Patient denies none.   Cardiovascular risk factors include advanced age (older than 45 for men, 37 for women), diabetes mellitus, dyslipidemia, hypertension and male gender.  Use of agents associated with hypertension: Metoprolol and Ramapril.   ------------------------------------------------------------------------    Lipid/Cholesterol, Follow-up:   Last seen for this 3 months ago.  Management since that visit includes none.  Last Lipid Panel:    Component Value Date/Time     CHOL 132 10/18/2015 0923   CHOL 123 06/06/2008 2020   TRIG 50 10/18/2015 0923   HDL 38* 10/18/2015 0923   HDL 39* 06/06/2008 2020   CHOLHDL 3.5 10/18/2015 0923   CHOLHDL 3.2 06/06/2008 2020   VLDL 6 06/06/2008 2020   LDLCALC 84 10/18/2015 0923   LDLCALC  06/06/2008 2020    78        Total Cholesterol/HDL:CHD Risk Coronary Heart Disease Risk Table                     Men   Women  1/2 Average Risk   3.4   3.3    He reports good compliance with treatment. He is not having side effects.   Wt Readings from Last 3 Encounters:  03/02/16 174 lb 3.2 oz (79.017 kg)  01/09/16 171 lb 3.2 oz (77.656 kg)  11/22/15 173 lb 6.4 oz (78.654 kg)    ------------------------------------------------------------------------   Previous Medications   CHOLECALCIFEROL (VITAMIN D) 1000 UNITS TABLET    Take 1,000 Units by mouth daily.   CLOPIDOGREL (PLAVIX) 75 MG TABLET    Take 1 tablet (75 mg total) by mouth daily.   METOPROLOL SUCCINATE (TOPROL-XL) 25 MG 24 HR TABLET    Take 0.5 tablets (12.5 mg total) by mouth daily.   MULTIPLE VITAMIN (MULTIVITAMIN) TABLET    Take 1 tablet by mouth daily.   NAPROXEN SODIUM (ANAPROX) 220 MG TABLET    Take 220 mg by mouth as needed.   NITROGLYCERIN (NITROSTAT) 0.4 MG SL TABLET    Place 0.4 mg under  the tongue every 5 (five) minutes as needed for chest pain.   ONE TOUCH ULTRA TEST TEST STRIP    Check fasting blood sugar once a day   ONETOUCH DELICA LANCETS FINE MISC    One finger stick daily for fasting blood sugar check.   PANTOPRAZOLE (PROTONIX) 40 MG TABLET    TAKE 1 TABLET EVERY DAY   RAMIPRIL (ALTACE) 5 MG CAPSULE    TAKE 1 CAPSULE EVERY DAY   ROSUVASTATIN (CRESTOR) 5 MG TABLET    Take 1 tablet (5 mg total) by mouth daily.   Allergies  Allergen Reactions  . Niaspan [Niacin Er] Rash  . Penicillins Rash    Review of Systems  Constitutional: Negative.   HENT: Negative.   Eyes: Negative.   Respiratory: Negative.   Cardiovascular: Negative.    Gastrointestinal: Negative.   Endocrine: Negative.   Genitourinary: Negative.   Musculoskeletal: Negative.   Skin: Negative.   Allergic/Immunologic: Negative.   Neurological: Negative.   Hematological: Negative.   Psychiatric/Behavioral: Negative.     Social History  Substance Use Topics  . Smoking status: Never Smoker   . Smokeless tobacco: Never Used  . Alcohol Use: No   Objective:   BP 136/62 mmHg  Pulse 56  Temp(Src) 97.7 F (36.5 C) (Oral)  Resp 14  Wt 174 lb 3.2 oz (79.017 kg)  Physical Exam  Constitutional: He is oriented to person, place, and time. He appears well-developed and well-nourished.  HENT:  Head: Normocephalic.  Right Ear: External ear normal.  Left Ear: External ear normal.  Nose: Nose normal.  Eyes: Conjunctivae and EOM are normal.  Neck: Normal range of motion. Neck supple.  Cardiovascular: Normal rate, regular rhythm and normal heart sounds.   Pulmonary/Chest: Effort normal and breath sounds normal.  Abdominal: Soft. Bowel sounds are normal.  Neurological: He is alert and oriented to person, place, and time.      Assessment & Plan:     1. Controlled type 2 diabetes mellitus with diabetic nephropathy, without long-term current use of insulin (HCC) Some elevation in FBS at home (ranging from 130-190) with dietary indiscretions (cake). Denies polydipsia or vision changes. No change in tingling in feet. Encouraged to see ophthalmologist annually. Continue diabetic diet and check FBS daily. Recheck labs and follow pending reports. - Comprehensive metabolic panel - Hemoglobin A1c  2. Essential hypertension Stable. Tolerating Metoprolol and Ramapril without side effects. No chest pains, palpitations, dyspnea or edema. Recheck routine labs and follow up pending reports. - CBC with Differential/Platelet  3. Dyslipidemia, goal LDL below 70 Tolerating Crestor without myalgias. Continues low fat, restricted salt, diabetic diet. Recheck lipid panel. -  Lipid panel

## 2016-03-03 LAB — LIPID PANEL
CHOL/HDL RATIO: 3.7 ratio (ref 0.0–5.0)
CHOLESTEROL TOTAL: 134 mg/dL (ref 100–199)
HDL: 36 mg/dL — ABNORMAL LOW (ref >39)
LDL Calculated: 87 mg/dL (ref 0–99)
TRIGLYCERIDES: 57 mg/dL (ref 0–149)
VLDL Cholesterol Cal: 11 mg/dL (ref 5–40)

## 2016-03-03 LAB — CBC WITH DIFFERENTIAL/PLATELET
BASOS: 1 %
Basophils Absolute: 0 10*3/uL (ref 0.0–0.2)
EOS (ABSOLUTE): 0.1 10*3/uL (ref 0.0–0.4)
EOS: 2 %
HEMATOCRIT: 42 % (ref 37.5–51.0)
HEMOGLOBIN: 14.5 g/dL (ref 12.6–17.7)
IMMATURE GRANS (ABS): 0 10*3/uL (ref 0.0–0.1)
IMMATURE GRANULOCYTES: 0 %
LYMPHS: 21 %
Lymphocytes Absolute: 1.5 10*3/uL (ref 0.7–3.1)
MCH: 29.1 pg (ref 26.6–33.0)
MCHC: 34.5 g/dL (ref 31.5–35.7)
MCV: 84 fL (ref 79–97)
MONOCYTES: 10 %
Monocytes Absolute: 0.7 10*3/uL (ref 0.1–0.9)
NEUTROS PCT: 66 %
Neutrophils Absolute: 4.8 10*3/uL (ref 1.4–7.0)
Platelets: 179 10*3/uL (ref 150–379)
RBC: 4.99 x10E6/uL (ref 4.14–5.80)
RDW: 14.1 % (ref 12.3–15.4)
WBC: 7.1 10*3/uL (ref 3.4–10.8)

## 2016-03-03 LAB — COMPREHENSIVE METABOLIC PANEL
ALT: 19 IU/L (ref 0–44)
AST: 18 IU/L (ref 0–40)
Albumin/Globulin Ratio: 1.6 (ref 1.2–2.2)
Albumin: 4.2 g/dL (ref 3.5–4.7)
Alkaline Phosphatase: 61 IU/L (ref 39–117)
BUN/Creatinine Ratio: 25 — ABNORMAL HIGH (ref 10–22)
BUN: 16 mg/dL (ref 8–27)
Bilirubin Total: 0.7 mg/dL (ref 0.0–1.2)
CALCIUM: 8.7 mg/dL (ref 8.6–10.2)
CO2: 25 mmol/L (ref 18–29)
CREATININE: 0.64 mg/dL — AB (ref 0.76–1.27)
Chloride: 100 mmol/L (ref 96–106)
GFR, EST AFRICAN AMERICAN: 106 mL/min/{1.73_m2} (ref >59)
GFR, EST NON AFRICAN AMERICAN: 92 mL/min/{1.73_m2} (ref >59)
GLOBULIN, TOTAL: 2.7 g/dL (ref 1.5–4.5)
Glucose: 119 mg/dL — ABNORMAL HIGH (ref 65–99)
Potassium: 4.3 mmol/L (ref 3.5–5.2)
Sodium: 140 mmol/L (ref 134–144)
TOTAL PROTEIN: 6.9 g/dL (ref 6.0–8.5)

## 2016-03-03 LAB — HEMOGLOBIN A1C
Est. average glucose Bld gHb Est-mCnc: 154 mg/dL
HEMOGLOBIN A1C: 7 % — AB (ref 4.8–5.6)

## 2016-03-05 ENCOUNTER — Telehealth: Payer: Self-pay

## 2016-03-05 NOTE — Telephone Encounter (Signed)
Patient advised as directed below. Patient verbalized understanding and agrees with plan of care. Patient scheduled for 6 month follow up.

## 2016-03-05 NOTE — Telephone Encounter (Signed)
-----   Message from Margo Common, [place] sent at 03/05/2016  7:53 AM EDT ----- Blood tests essentially normal. Hgb A1C is 7.0 (good sugar control). HDL cholesterol low but the remainder of cholesterol levels are in good shape. Continue present medications and diet. Increase in activities during warm weather should help maintain control of these parameters. Recheck in 4-6 months.

## 2016-04-13 ENCOUNTER — Other Ambulatory Visit: Payer: Self-pay

## 2016-04-13 MED ORDER — RAMIPRIL 5 MG PO CAPS: 5.0000 mg | ORAL_CAPSULE | Freq: Every day | ORAL | Status: DC

## 2016-06-06 ENCOUNTER — Other Ambulatory Visit: Payer: Self-pay | Admitting: *Deleted

## 2016-06-06 MED ORDER — METOPROLOL SUCCINATE ER 25 MG PO TB24: 12.5000 mg | ORAL_TABLET | Freq: Every day | ORAL | Status: DC

## 2016-06-22 ENCOUNTER — Other Ambulatory Visit: Payer: Self-pay

## 2016-06-22 MED ORDER — ROSUVASTATIN CALCIUM 5 MG PO TABS: 5.0000 mg | ORAL_TABLET | Freq: Every day | ORAL | Status: DC

## 2016-06-22 NOTE — Telephone Encounter (Signed)
Refill sent for rosuvastatin 5 mg

## 2016-07-18 ENCOUNTER — Ambulatory Visit (INDEPENDENT_AMBULATORY_CARE_PROVIDER_SITE_OTHER): Payer: PPO | Admitting: Family Medicine

## 2016-07-18 ENCOUNTER — Encounter: Payer: Self-pay | Admitting: Family Medicine

## 2016-07-18 VITALS — BP 126/60 | HR 79 | Temp 98.2°F | Resp 16 | Wt 176.2 lb

## 2016-07-18 DIAGNOSIS — J069 Acute upper respiratory infection, unspecified: Secondary | ICD-10-CM

## 2016-07-18 NOTE — Patient Instructions (Addendum)
Discussed use of saline nasal spray, Mucinex, Benadryl at bedtime, and Delsym for cough.

## 2016-07-18 NOTE — Telephone Encounter (Signed)
error 

## 2016-07-18 NOTE — Progress Notes (Signed)
Subjective:     Patient ID: Charles Sellers., male   DOB: Jun 28, 1932, 82 y.o.   MRN: [id number]  HPI  Chief Complaint  Patient presents with  . Cough    Patient comes in office today with concerns of cough and congestion for the past 3 days.   Reports cold sx without fever or chills. Has taken Benadryl for his sx.   Review of Systems     Objective:   Physical Exam  Constitutional: He appears well-developed and well-nourished. No distress.  Ears: T.M's intact without inflammation Throat: no tonsillar enlargement or exudate Neck: no cervical adenopathy Lungs: clear    Assessment:    1. Upper respiratory infection     Plan:    Discussed otc treatment and natural history of his illness.

## 2016-07-25 ENCOUNTER — Ambulatory Visit (INDEPENDENT_AMBULATORY_CARE_PROVIDER_SITE_OTHER): Payer: PPO | Admitting: Cardiology

## 2016-07-25 ENCOUNTER — Encounter: Payer: Self-pay | Admitting: Cardiology

## 2016-07-25 VITALS — BP 138/72 | HR 61 | Ht 68.0 in | Wt 173.2 lb

## 2016-07-25 DIAGNOSIS — N181 Chronic kidney disease, stage 1: Secondary | ICD-10-CM

## 2016-07-25 DIAGNOSIS — Z9861 Coronary angioplasty status: Secondary | ICD-10-CM | POA: Diagnosis not present

## 2016-07-25 DIAGNOSIS — I2510 Atherosclerotic heart disease of native coronary artery without angina pectoris: Secondary | ICD-10-CM

## 2016-07-25 DIAGNOSIS — E1122 Type 2 diabetes mellitus with diabetic chronic kidney disease: Secondary | ICD-10-CM

## 2016-07-25 DIAGNOSIS — I10 Essential (primary) hypertension: Secondary | ICD-10-CM | POA: Diagnosis not present

## 2016-07-25 DIAGNOSIS — E785 Hyperlipidemia, unspecified: Secondary | ICD-10-CM | POA: Diagnosis not present

## 2016-07-25 MED ORDER — RAMIPRIL 5 MG PO CAPS: 5.0000 mg | ORAL_CAPSULE | Freq: Every day | ORAL | 11 refills | Status: DC

## 2016-07-25 MED ORDER — CLOPIDOGREL BISULFATE 75 MG PO TABS: 75.0000 mg | ORAL_TABLET | Freq: Every day | ORAL | 11 refills | Status: DC

## 2016-07-25 MED ORDER — ROSUVASTATIN CALCIUM 5 MG PO TABS
10.0000 mg | ORAL_TABLET | Freq: Every day | ORAL | 11 refills | Status: DC
Start: 2016-07-25 — End: 2017-07-30

## 2016-07-25 MED ORDER — METOPROLOL SUCCINATE ER 25 MG PO TB24: 12.5000 mg | ORAL_TABLET | Freq: Every day | ORAL | 11 refills | Status: DC

## 2016-07-25 MED ORDER — PANTOPRAZOLE SODIUM 40 MG PO TBEC: DELAYED_RELEASE_TABLET | ORAL | 11 refills | Status: DC

## 2016-07-25 NOTE — Progress Notes (Signed)
PCP: Charles Murders, PA  Clinic Note: Chief Complaint  Patient presents with  . Other    12 month follow up. "doing well."   . Coronary Artery Disease    Status post PCI    HPI: Charles Della. is a 82 y.o. male with a PMH below who presents today for annual follow-up of CAD. Is a former patient of Dr. Terance Ice who I started seeing upon Dr. Lowella Fairy retirement.  Charles Sellers. was last seen on August 2016. He was doing relatively well except for being sore from a fall doing gardening.  Recent Hospitalizations: n/a  Studies Reviewed:  N/a, besides labs Lab Results  Component Value Date   CHOL 134 03/02/2016   HDL 36 (L) 03/02/2016   LDLCALC 87 03/02/2016   TRIG 57 03/02/2016   CHOLHDL 3.7 03/02/2016    Interval History: Charles Sellers returns today doing well.  He continues to be very active, on the go from the time he wakes up in the morning until after dinner at night. His wife has to call him in to eat. He is always doing yard work, and walking, exercise.  He is totally asymptomatic from cardiac standpoint denying any resting or exertional chest tightness/pressure. No heart failure symptoms of dyspnea, PND, orthopnea or edema.  No palpitations, lightheadedness, dizziness, weakness or syncope/near syncope. No TIA/amaurosis fugax symptoms. No melena, hematochezia, hematuria, or epstaxis - only has nose bleed when he has a cold No claudication.  Sleeps with a wedge pillow (helps with breathing) - has been doing this for well over 10 years.  ROS: A comprehensive was performed. Review of Systems  Constitutional: Negative for malaise/fatigue.  HENT: Positive for nosebleeds.   Respiratory: Negative for cough and shortness of breath.   Cardiovascular: Negative for claudication.  Gastrointestinal: Negative for blood in stool and melena.  Genitourinary: Negative for hematuria.  Musculoskeletal: Negative for falls and joint pain.  Neurological: Negative.     Endo/Heme/Allergies: Does not bruise/bleed easily.  Psychiatric/Behavioral: Negative.   All other systems reviewed and are negative.   Past Medical History:  Diagnosis Date  . Atherosclerosis   . CAD (coronary artery disease) 2011 & 2009   Echo, EF =>55% 2011/ Echo, EF =>55% 2009  . CAD S/P percutaneous coronary angioplasty 12/13 & 15 2005   PCI - mRCA (Cypher DES 3.5 mm x 18 mm --> 4.57mm); RPL Cypher 2.5 mm x 13 mm;; mLAD Cy a pher DES 3.0 mm x 13 mm (3.5 mm) with Cutting PTCA of D2 ostium;; mid Cx 100% after small OM1.  . Cholecystitis   . Dermatitis   . Diabetes mellitus type II, controlled ([place])    Diet controlled. No medications; he indicates "borderline diabetes"  . Dyslipidemia, goal LDL below 70     on statin  . Hypertension   . Prostate nodule   . Ventral hernia     Past Surgical History:  Procedure Laterality Date  . APPENDECTOMY    . CARDIAC CATHETERIZATION  2005  . Carotid Dopplers  September 2014   Bilateral internal carotids < 49%.  . CORONARY ANGIOPLASTY WITH STENT PLACEMENT     mRCA Cypher DES 3.5 mm x 18 mm (4.0), RPL Cypher DES 2.5 mm x 13 mm; mLAD 3.0 mm x 13 mm Cypher -> cutting PTCA of jailed D2; previousl PCI of Cx - 100% mid Cx.  Marland Kitchen NM MYOVIEW LTD  06/2012   Small area of basal inferior infarct with no ischemia. Normal EF  .  Stress Myoview  2013   normal findings  . TRANSTHORACIC ECHOCARDIOGRAM  February 2014    EF 55%. Mild-moderate MR    Prior to Admission medications   Medication Sig Start Date End Date Taking? Authorizing Provider  cholecalciferol (VITAMIN D) 1000 UNITS tablet Take 1,000 Units by mouth daily.   Yes Historical Provider, MD  clopidogrel (PLAVIX) 75 MG tablet Take 1 tablet (75 mg total) by mouth daily. 11/21/15  Yes Leonie Man, MD  metoprolol succinate (TOPROL-XL) 25 MG 24 hr tablet Take 0.5 tablets (12.5 mg total) by mouth daily. 06/06/16  Yes Leonie Man, MD  Multiple Vitamin (MULTIVITAMIN) tablet Take 1 tablet by mouth  daily.   Yes Historical Provider, MD  naproxen sodium (ANAPROX) 220 MG tablet Take 220 mg by mouth as needed.   Yes Historical Provider, MD  nitroGLYCERIN (NITROSTAT) 0.4 MG SL tablet Place 0.4 mg under the tongue every 5 (five) minutes as needed for chest pain.   Yes Historical Provider, MD  ONE TOUCH ULTRA TEST test strip Check fasting blood sugar once a day 01/27/16  Yes Dennis E Chrismon, PA  ONETOUCH DELICA LANCETS FINE MISC One finger stick daily for fasting blood sugar check. 01/27/16  Yes Dennis E Chrismon, PA  pantoprazole (PROTONIX) 40 MG tablet TAKE 1 TABLET EVERY DAY Patient taking differently: TAKE 1 TABLET EVERY DAY - pt takes Mon, Wed, Fri 01/10/15  Yes Leonie Man, MD  ramipril (ALTACE) 5 MG capsule Take 1 capsule (5 mg total) by mouth daily. 04/13/16  Yes Leonie Man, MD  rosuvastatin (CRESTOR) 5 MG tablet Take 1 tablet (5 mg total) by mouth daily. 06/22/16  Yes Leonie Man, MD    Allergies  Allergen Reactions  . Niaspan [Niacin Er] Rash  . Penicillins Rash    Social History   Social History  . Marital status: Married    Spouse name: N/A  . Number of children: N/A  . Years of education: N/A   Social History Main Topics  . Smoking status: Never Smoker  . Smokeless tobacco: Never Used  . Alcohol use No  . Drug use: No  . Sexual activity: Not Asked   Other Topics Concern  . None   Social History Narrative   Married father of 3, 9 grandchildren, now 23 great-grandchildren. 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   Using the toilet, but does not do routine exercise. Always on the go.   Never smoked. Does not drink alcohol.   Family History  Problem Relation Age of Onset  . Stroke Mother   . Liver cancer Father   . Diabetes Father   . Esophageal cancer Brother   . Cerebral aneurysm Daughter   . Melanoma Daughter   . Cerebral aneurysm  Son     Wt Readings from Last 3 Encounters:  07/25/16 173 lb 4 oz (78.6 kg)  07/18/16 176 lb 3.2 oz (79.9 kg)  03/02/16 174 lb 3.2 oz (79 kg)   PHYSICAL EXAM BP 138/72 (BP Location: Left Arm, Patient Position: Sitting, Cuff Size: Normal)   Pulse 61   Ht 5\' 8"  (1.727 m)   Wt 173 lb 4 oz (78.6 kg)   BMI 26.34 kg/m  General appearance: alert, cooperative, appears stated age, no distress and Pleasant mood and affect. Well-nourished and well-groomed. Answers questions appropriately.  HEENT: [place]/AT, EOMI, MMM, anicteric  sclera  Neck: no adenopathy, no carotid bruit, no JVD, supple, symmetrical, trachea midline and thyroid not enlarged, symmetric, no tenderness/mass/nodules  Lungs: clear to auscultation bilaterally, normal percussion bilaterally and Nonlabored, good air movement.  Heart: normal apical impulse, S1, S2 normal and RRR. Soft 1/6 HSM at left sternal border.; No rubs or gallop  Abdomen: soft, non-tender; bowel sounds normal; no masses, no organomegaly  Extremities: extremities normal, atraumatic, no cyanosis or edema and varicose veins noted  Pulses: 2+ and symmetric  Neurologic: Alert and oriented X 3, normal strength and tone. Normal symmetric reflexes. Normal coordination and gait; CN 2-12 grossly intact   Adult ECG Report  Rate: 61 ;  Rhythm: normal sinus rhythm with sinus arrhythmia;  Narrative Interpretation: No significant change from August 2016  Other studies Reviewed: Additional studies/ records that were reviewed today include:  Recent Labs:  See above Lab Results  Component Value Date   HGBA1C 7.0 (H) 03/02/2016    ASSESSMENT / PLAN: Problem List Items Addressed This Visit    Essential hypertension (Chronic)    Well-controlled current medications. No changes      Relevant Medications   rosuvastatin (CRESTOR) 5 MG tablet   ramipril (ALTACE) 5 MG capsule   metoprolol succinate (TOPROL-XL) 25 MG 24 hr tablet   Other Relevant Orders   EKG 12-Lead  (Completed)   Dyslipidemia, goal LDL below 70 (Chronic)    Continues to be on low-dose Crestor;  Most recent LDL was not quite at goal. LAN: Increase Crestor to 10 mg 3 days a week. After 1 month if tolerated, increase to 10 mg daily..      Relevant Medications   rosuvastatin (CRESTOR) 5 MG tablet   ramipril (ALTACE) 5 MG capsule   metoprolol succinate (TOPROL-XL) 25 MG 24 hr tablet   Diabetes mellitus type II, controlled ([place]) (Chronic)    Well-controlled on no medications currently. Monitored by PCP last A1c was 7.0.      Relevant Medications   rosuvastatin (CRESTOR) 5 MG tablet   ramipril (ALTACE) 5 MG capsule   CAD S/P percutaneous coronary angioplasty - Primary (Chronic)    PCI - mRCA (Cypher DES 3.5 mm x 18 mm --> 4.28mm); RPL Cypher 2.5 mm x 13 mm;; mLAD Cy a pher DES 3.0 mm x 13 mm (3.5 mm) with Cutting PTCA of D2 ostium;; mid Cx 100% after small OM1. Continues to be very active without any significant anginal symptoms. Has not had any anginal symptoms since I have known him. Myoview in 2013. Would not repeat unless he has symptoms.  Continues to be on Plavix without aspirin. On low-dose Toprol, ACE inhibitor and Crestor.      Relevant Medications   rosuvastatin (CRESTOR) 5 MG tablet   ramipril (ALTACE) 5 MG capsule   metoprolol succinate (TOPROL-XL) 25 MG 24 hr tablet   Other Relevant Orders   EKG 12-Lead (Completed)    Other Visit Diagnoses   None.     Current medicines are reviewed at length with the patient today. (+/- concerns) n/a The following changes have been made:  Medication Instructions:  Your physician has recommended you make the following change in your medication:  TAKE crestor to 10mg  (2 tablets) daily on Mondays, Wednesdays and Fridays and 5mg  (1 tablet) Sunday, Tuesday, Thursday, Saturday for ONE MONTH.  After one month, increase crestor to 10mg  once daily   Labwork: none  Testing/Procedures: none  Follow-Up: Your physician wants you to  follow-up in: one year with Dr. Ellyn Hack  in [place].  Studies Ordered:   Orders Placed This Encounter  Procedures  . EKG 12-Lead     Glenetta Hew, M.D., M.S. Interventional Cardiologist   Pager # [phone]

## 2016-07-25 NOTE — Patient Instructions (Addendum)
Medication Instructions:  Your physician has recommended you make the following change in your medication:  TAKE crestor to 10mg  (2 tablets) daily on Mondays, Wednesdays and Fridays and 5mg  (1 tablet) Sunday, Tuesday, Thursday, Saturday for ONE MONTH.  After one month, increase crestor to 10mg  once daily   Labwork: none  Testing/Procedures: none  Follow-Up: Your physician wants you to follow-up in: one year with Dr. Ellyn Hack in [place].  You will receive a reminder letter in the mail two months in advance. If you don't receive a letter, please call our office to schedule the follow-up appointment.   Any Other Special Instructions Will Be Listed Below (If Applicable).     If you need a refill on your cardiac medications before your next appointment, please call your pharmacy.

## 2016-07-27 ENCOUNTER — Encounter: Payer: Self-pay | Admitting: Cardiology

## 2016-07-27 NOTE — Assessment & Plan Note (Signed)
Well-controlled current medications. No changes

## 2016-07-27 NOTE — Assessment & Plan Note (Signed)
Continues to be on low-dose Crestor;  Most recent LDL was not quite at goal. LAN: Increase Crestor to 10 mg 3 days a week. After 1 month if tolerated, increase to 10 mg daily.Marland Kitchen

## 2016-07-27 NOTE — Assessment & Plan Note (Signed)
Well-controlled on no medications currently. Monitored by PCP last A1c was 7.0.

## 2016-07-27 NOTE — Assessment & Plan Note (Signed)
PCI - mRCA (Cypher DES 3.5 mm x 18 mm --> 4.92mm); RPL Cypher 2.5 mm x 13 mm;; mLAD Cy a pher DES 3.0 mm x 13 mm (3.5 mm) with Cutting PTCA of D2 ostium;; mid Cx 100% after small OM1. Continues to be very active without any significant anginal symptoms. Has not had any anginal symptoms since I have known him. Myoview in 2013. Would not repeat unless he has symptoms.  Continues to be on Plavix without aspirin. On low-dose Toprol, ACE inhibitor and Crestor.

## 2016-09-04 ENCOUNTER — Ambulatory Visit: Payer: Self-pay | Admitting: Family Medicine

## 2016-09-07 ENCOUNTER — Encounter: Payer: Self-pay | Admitting: Family Medicine

## 2016-09-07 ENCOUNTER — Ambulatory Visit (INDEPENDENT_AMBULATORY_CARE_PROVIDER_SITE_OTHER): Payer: PPO | Admitting: Family Medicine

## 2016-09-07 VITALS — BP 122/76 | HR 61 | Temp 98.1°F | Resp 14 | Wt 175.0 lb

## 2016-09-07 DIAGNOSIS — N181 Chronic kidney disease, stage 1: Secondary | ICD-10-CM

## 2016-09-07 DIAGNOSIS — E1122 Type 2 diabetes mellitus with diabetic chronic kidney disease: Secondary | ICD-10-CM

## 2016-09-07 DIAGNOSIS — I10 Essential (primary) hypertension: Secondary | ICD-10-CM | POA: Diagnosis not present

## 2016-09-07 DIAGNOSIS — E785 Hyperlipidemia, unspecified: Secondary | ICD-10-CM | POA: Diagnosis not present

## 2016-09-07 DIAGNOSIS — D489 Neoplasm of uncertain behavior, unspecified: Secondary | ICD-10-CM | POA: Diagnosis not present

## 2016-09-07 NOTE — Progress Notes (Signed)
Patient: Charles Sellers. Male    DOB: [birth year]   82 y.o.   MRN: [id number] Visit Date: 09/07/2016  Today's Provider: [name], PA   Chief Complaint  Patient presents with  . Hyperlipidemia  . Hypertension  . Diabetes  . Follow-up   Subjective:    HPI  Diabetes Mellitus Type II, Follow-up:   Lab Results  Component Value Date   HGBA1C 7.0 (H) 03/02/2016   HGBA1C 6.7 (H) 10/18/2015   HGBA1C 7.3 (H) 07/26/2015   Last seen for diabetes 6 months ago.  Management since then includes none. He reports excellent compliance with treatment. He is not having side effects.  Current symptoms include none and have been stable. Home blood sugar records: fasting range: 120-125  Episodes of hypoglycemia? no   Current Insulin Regimen: none Weight trend: stable Current diet: low carb and low salt Current exercise: yard work  ------------------------------------------------------------------------   Hypertension, follow-up:  BP Readings from Last 3 Encounters:  09/07/16 122/76  07/25/16 138/72  07/18/16 126/60    He was last seen for hypertension 6 months ago.  BP at that visit was 136/62. Management since that visit includes continue medication.He reports excellent compliance with treatment. He is not having side effects.  He is exercising. He is adherent to low salt diet.   Outside blood pressures are not being checked. He is experiencing none.  Patient denies chest pain, irregular heart beat and palpitations.   Cardiovascular risk factors include advanced age (older than 78 for men, 13 for women), diabetes mellitus, dyslipidemia, hypertension and male gender.  Use of agents associated with hypertension: none.   ------------------------------------------------------------------------    Lipid/Cholesterol, Follow-up:   Last seen for this 6 months ago.  Management since that visit includes continue medication.  Last Lipid Panel:    Component Value  Date/Time   CHOL 134 03/02/2016 0934   TRIG 57 03/02/2016 0934   HDL 36 (L) 03/02/2016 0934   CHOLHDL 3.7 03/02/2016 0934   CHOLHDL 3.2 06/06/2008 2020   VLDL 6 06/06/2008 2020   LDLCALC 87 03/02/2016 0934    He reports excellent compliance with treatment. He is not having side effects.   Wt Readings from Last 3 Encounters:  09/07/16 175 lb (79.4 kg)  07/25/16 173 lb 4 oz (78.6 kg)  07/18/16 176 lb 3.2 oz (79.9 kg)    ------------------------------------------------------------------------ Past Medical History:  Diagnosis Date  . Atherosclerosis   . CAD (coronary artery disease) 2011 & 2009   Echo, EF =>55% 2011/ Echo, EF =>55% 2009  . CAD S/P percutaneous coronary angioplasty 12/13 & 15 2005   PCI - mRCA (Cypher DES 3.5 mm x 18 mm --> 4.43mm); RPL Cypher 2.5 mm x 13 mm;; mLAD Cy a pher DES 3.0 mm x 13 mm (3.5 mm) with Cutting PTCA of D2 ostium;; mid Cx 100% after small OM1.  . Cholecystitis   . Dermatitis   . Diabetes mellitus type II, controlled ([place])    Diet controlled. No medications; he indicates "borderline diabetes"  . Dyslipidemia, goal LDL below 70     on statin  . Hypertension   . Prostate nodule   . Ventral hernia    Past Surgical History:  Procedure Laterality Date  . APPENDECTOMY    . CARDIAC CATHETERIZATION  2005  . Carotid Dopplers  September 2014   Bilateral internal carotids < 49%.  . CORONARY ANGIOPLASTY WITH STENT PLACEMENT     mRCA Cypher DES 3.5 mm x 18 mm (  4.0), RPL Cypher DES 2.5 mm x 13 mm; mLAD 3.0 mm x 13 mm Cypher -> cutting PTCA of jailed D2; previousl PCI of Cx - 100% mid Cx.  Marland Kitchen NM MYOVIEW LTD  06/2012   Small area of basal inferior infarct with no ischemia. Normal EF  . Stress Myoview  2013   normal findings  . TRANSTHORACIC ECHOCARDIOGRAM  February 2014    EF 55%. Mild-moderate MR   Family History  Problem Relation Age of Onset  . Stroke Mother   . Liver cancer Father   . Diabetes Father   . Esophageal cancer Brother   . Cerebral  aneurysm Daughter   . Melanoma Daughter   . Cerebral aneurysm Son    Allergies  Allergen Reactions  . Niaspan [Niacin Er] Rash  . Penicillins Rash     Previous Medications   CHOLECALCIFEROL (VITAMIN D) 1000 UNITS TABLET    Take 1,000 Units by mouth daily.   CLOPIDOGREL (PLAVIX) 75 MG TABLET    Take 1 tablet (75 mg total) by mouth daily.   METOPROLOL SUCCINATE (TOPROL-XL) 25 MG 24 HR TABLET    Take 0.5 tablets (12.5 mg total) by mouth daily.   MULTIPLE VITAMIN (MULTIVITAMIN) TABLET    Take 1 tablet by mouth daily.   NAPROXEN SODIUM (ANAPROX) 220 MG TABLET    Take 220 mg by mouth as needed.   NITROGLYCERIN (NITROSTAT) 0.4 MG SL TABLET    Place 0.4 mg under the tongue every 5 (five) minutes as needed for chest pain.   ONE TOUCH ULTRA TEST TEST STRIP    Check fasting blood sugar once a day   ONETOUCH DELICA LANCETS FINE MISC    One finger stick daily for fasting blood sugar check.   PANTOPRAZOLE (PROTONIX) 40 MG TABLET    TAKE 1 TABLET EVERY DAY - pt takes Mon, Wed, Fri   RAMIPRIL (ALTACE) 5 MG CAPSULE    Take 1 capsule (5 mg total) by mouth daily.   ROSUVASTATIN (CRESTOR) 5 MG TABLET    Take 2 tablets (10 mg total) by mouth daily.    Review of Systems  Constitutional: Negative.   Respiratory: Negative.   Cardiovascular: Negative.   Gastrointestinal: Negative.   Endocrine: Negative.   Genitourinary: Negative.   Musculoskeletal: Negative.     Social History  Substance Use Topics  . Smoking status: Never Smoker  . Smokeless tobacco: Never Used  . Alcohol use No   Objective:   BP 122/76 (BP Location: Right Arm, Patient Position: Sitting, Cuff Size: Normal)   Pulse 61   Temp 98.1 F (36.7 C) (Oral)   Resp 14   Wt 175 lb (79.4 kg)   BMI 26.61 kg/m   Physical Exam  Constitutional: He is oriented to person, place, and time. He appears well-developed and well-nourished. No distress.  HENT:  Head: Normocephalic and atraumatic.  Right Ear: Hearing normal.  Left Ear: Hearing  normal.  Nose: Nose normal.  Eyes: Conjunctivae and lids are normal. Right eye exhibits no discharge. Left eye exhibits no discharge. No scleral icterus.  Cardiovascular: Normal rate and regular rhythm.   Pulmonary/Chest: Effort normal and breath sounds normal. No respiratory distress.  Abdominal: Soft. Bowel sounds are normal.  Neurological: He is alert and oriented to person, place, and time.  Skin: Skin is intact. No lesion and no rash noted.  Crusted raised lesion on the right auricle posterior edge with central scab.  Psychiatric: He has a normal mood and affect. His speech  is normal and behavior is normal. Thought content normal.      Assessment & Plan:     1. Controlled type 2 diabetes mellitus with stage 1 chronic kidney disease, without long-term current use of insulin ([place]) Tolerating diabetic diet and BS 120-130 by glucometer at home. Had normal sensation in feet to test with nylon string. Next ophthalmology exam will be 09-10-16. Continue present diabetes regimen and recheck labs. - CBC with Differential/Platelet - Comprehensive metabolic panel - Hemoglobin A1c - Lipid panel  2. Essential hypertension BP well controlled, Tolerating Ramipril 5 mg qd with Metoprolol Succinate 25 mg 1/2 tablet qd. Continue salt restrictions and recheck prn. - CBC with Differential/Platelet - Lipid panel - TSH  3. Dyslipidemia, goal LDL below 70 Tolerating Crestor 5 mg qd with low fat diet. Recheck labs and follow up pending reports. - Comprehensive metabolic panel - Lipid panel - TSH  4. Neoplasm of uncertain behavior Lesion on the posterior edge of the right auricle. 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 Will be seen by Dr. Nehemiah Massed for removal of this lesion.

## 2016-09-08 LAB — CBC WITH DIFFERENTIAL/PLATELET
BASOS ABS: 0 10*3/uL (ref 0.0–0.2)
BASOS: 1 %
EOS (ABSOLUTE): 0.2 10*3/uL (ref 0.0–0.4)
Eos: 3 %
Hematocrit: 44.2 % (ref 37.5–51.0)
Hemoglobin: 15.1 g/dL (ref 12.6–17.7)
IMMATURE GRANULOCYTES: 0 %
Immature Grans (Abs): 0 10*3/uL (ref 0.0–0.1)
LYMPHS: 25 %
Lymphocytes Absolute: 1.5 10*3/uL (ref 0.7–3.1)
MCH: 29.7 pg (ref 26.6–33.0)
MCHC: 34.2 g/dL (ref 31.5–35.7)
MCV: 87 fL (ref 79–97)
MONOS ABS: 0.6 10*3/uL (ref 0.1–0.9)
Monocytes: 9 %
NEUTROS PCT: 62 %
Neutrophils Absolute: 3.8 10*3/uL (ref 1.4–7.0)
PLATELETS: 178 10*3/uL (ref 150–379)
RBC: 5.09 x10E6/uL (ref 4.14–5.80)
RDW: 12.7 % (ref 12.3–15.4)
WBC: 6.1 10*3/uL (ref 3.4–10.8)

## 2016-09-08 LAB — COMPREHENSIVE METABOLIC PANEL
A/G RATIO: 1.8 (ref 1.2–2.2)
ALT: 20 IU/L (ref 0–44)
AST: 16 IU/L (ref 0–40)
Albumin: 4.4 g/dL (ref 3.5–4.7)
Alkaline Phosphatase: 62 IU/L (ref 39–117)
BILIRUBIN TOTAL: 0.9 mg/dL (ref 0.0–1.2)
BUN/Creatinine Ratio: 20 (ref 10–24)
BUN: 13 mg/dL (ref 8–27)
CALCIUM: 9.1 mg/dL (ref 8.6–10.2)
CHLORIDE: 101 mmol/L (ref 96–106)
CO2: 27 mmol/L (ref 18–29)
Creatinine, Ser: 0.64 mg/dL — ABNORMAL LOW (ref 0.76–1.27)
GFR calc Af Amer: 105 mL/min/{1.73_m2} (ref >59)
GFR calc non Af Amer: 91 mL/min/{1.73_m2} (ref >59)
GLUCOSE: 141 mg/dL — AB (ref 65–99)
Globulin, Total: 2.5 g/dL (ref 1.5–4.5)
POTASSIUM: 4.3 mmol/L (ref 3.5–5.2)
Sodium: 141 mmol/L (ref 134–144)
Total Protein: 6.9 g/dL (ref 6.0–8.5)

## 2016-09-08 LAB — LIPID PANEL
CHOLESTEROL TOTAL: 108 mg/dL (ref 100–199)
Chol/HDL Ratio: 2.9 ratio units (ref 0.0–5.0)
HDL: 37 mg/dL — AB (ref >39)
LDL CALC: 60 mg/dL (ref 0–99)
TRIGLYCERIDES: 56 mg/dL (ref 0–149)
VLDL CHOLESTEROL CAL: 11 mg/dL (ref 5–40)

## 2016-09-08 LAB — HEMOGLOBIN A1C
ESTIMATED AVERAGE GLUCOSE: 148 mg/dL
Hgb A1c MFr Bld: 6.8 % — ABNORMAL HIGH (ref 4.8–5.6)

## 2016-09-08 LAB — TSH: TSH: 0.814 u[IU]/mL (ref 0.450–4.500)

## 2016-09-10 DIAGNOSIS — E119 Type 2 diabetes mellitus without complications: Secondary | ICD-10-CM | POA: Diagnosis not present

## 2016-09-11 ENCOUNTER — Telehealth: Payer: Self-pay

## 2016-09-11 NOTE — Telephone Encounter (Signed)
-----   Message from Margo Common, [place] sent at 09/10/2016  5:01 PM EDT ----- All blood tests essentially normal except blood sugar high. Hgb A1C is in good diabetic control. Watch diet and walking for exercise will help. If Hgb A1C climbs again, may need diabetes medications added. Recheck level in 3 months.

## 2016-09-11 NOTE — Telephone Encounter (Signed)
LMTCB

## 2016-09-12 NOTE — Telephone Encounter (Signed)
Patient advised. Patient verbalized understanding. Follow up appointment scheduled.

## 2016-09-18 ENCOUNTER — Encounter: Payer: Self-pay | Admitting: Family Medicine

## 2016-12-14 ENCOUNTER — Encounter: Payer: Self-pay | Admitting: Family Medicine

## 2016-12-14 ENCOUNTER — Ambulatory Visit (INDEPENDENT_AMBULATORY_CARE_PROVIDER_SITE_OTHER): Payer: PPO | Admitting: Family Medicine

## 2016-12-14 VITALS — BP 124/68 | HR 60 | Temp 97.9°F | Resp 14 | Wt 178.6 lb

## 2016-12-14 DIAGNOSIS — E118 Type 2 diabetes mellitus with unspecified complications: Secondary | ICD-10-CM

## 2016-12-14 DIAGNOSIS — E785 Hyperlipidemia, unspecified: Secondary | ICD-10-CM | POA: Diagnosis not present

## 2016-12-14 DIAGNOSIS — I10 Essential (primary) hypertension: Secondary | ICD-10-CM

## 2016-12-14 NOTE — Progress Notes (Signed)
Patient: Charles Sellers. Male    DOB: [birth year]   83 y.o.   MRN: [id number] Visit Date: 12/14/2016  Today's Provider: [name], PA   Chief Complaint  Patient presents with  . Hypertension  . Hyperlipidemia  . Diabetes  . Follow-up   Subjective:    HPI  Diabetes Mellitus Type II, Follow-up:   Lab Results  Component Value Date   HGBA1C 6.8 (H) 09/07/2016   HGBA1C 7.0 (H) 03/02/2016   HGBA1C 6.7 (H) 10/18/2015   Last seen for diabetes 3 months ago.  Management since then includes recommended diet and walking for exercise. If A1C is increased more may need to add a medication. He reports fair compliance with treatment. He is not having side effects.  Current symptoms include none and have been stable. Home blood sugar records: fasting range: 120-160's  Episodes of hypoglycemia? no   Current Insulin Regimen: none Weight trend: stable Current diet: diabetic Current exercise: none  ------------------------------------------------------------------------   Hypertension, follow-up:  BP Readings from Last 3 Encounters:  12/14/16 124/68  09/07/16 122/76  07/25/16 138/72    He was last seen for hypertension 3 months ago.  BP at that visit was 122/76. Management since that visit includes continue medication.He reports good compliance with treatment. He is not having side effects.  He is not exercising. He is adherent to low salt diet.   Outside blood pressures are not being checked. He is experiencing none.  Patient denies chest pain, irregular heart beat and palpitations.   Cardiovascular risk factors include advanced age (older than 9 for men, 51 for women), diabetes mellitus, dyslipidemia, hypertension and male gender.  Use of agents associated with hypertension: none.   ------------------------------------------------------------------------    Lipid/Cholesterol, Follow-up:   Last seen for this 3 months ago.  Management since that visit includes  continue medication.  Last Lipid Panel:    Component Value Date/Time   CHOL 108 09/07/2016 1111   TRIG 56 09/07/2016 1111   HDL 37 (L) 09/07/2016 1111   CHOLHDL 2.9 09/07/2016 1111   CHOLHDL 3.2 06/06/2008 2020   VLDL 6 06/06/2008 2020   LDLCALC 60 09/07/2016 1111    He reports good compliance with treatment. He is not having side effects.   Wt Readings from Last 3 Encounters:  12/14/16 178 lb 9.6 oz (81 kg)  09/07/16 175 lb (79.4 kg)  07/25/16 173 lb 4 oz (78.6 kg)    ------------------------------------------------------------------------ Past Medical History:  Diagnosis Date  . Atherosclerosis   . CAD (coronary artery disease) 2011 & 2009   Echo, EF =>55% 2011/ Echo, EF =>55% 2009  . CAD S/P percutaneous coronary angioplasty 12/13 & 15 2005   PCI - mRCA (Cypher DES 3.5 mm x 18 mm --> 4.30mm); RPL Cypher 2.5 mm x 13 mm;; mLAD Cy a pher DES 3.0 mm x 13 mm (3.5 mm) with Cutting PTCA of D2 ostium;; mid Cx 100% after small OM1.  . Cholecystitis   . Dermatitis   . Diabetes mellitus type II, controlled ([place])    Diet controlled. No medications; he indicates "borderline diabetes"  . Dyslipidemia, goal LDL below 70     on statin  . Hypertension   . Prostate nodule   . Ventral hernia    Past Surgical History:  Procedure Laterality Date  . APPENDECTOMY    . CARDIAC CATHETERIZATION  2005  . Carotid Dopplers  September 2014   Bilateral internal carotids < 49%.  . CORONARY ANGIOPLASTY WITH STENT PLACEMENT  mRCA Cypher DES 3.5 mm x 18 mm (4.0), RPL Cypher DES 2.5 mm x 13 mm; mLAD 3.0 mm x 13 mm Cypher -> cutting PTCA of jailed D2; previousl PCI of Cx - 100% mid Cx.  Marland Kitchen NM MYOVIEW LTD  06/2012   Small area of basal inferior infarct with no ischemia. Normal EF  . Stress Myoview  2013   normal findings  . TRANSTHORACIC ECHOCARDIOGRAM  February 2014    EF 55%. Mild-moderate MR   Family History  Problem Relation Age of Onset  . Stroke Mother   . Liver cancer Father   .  Diabetes Father   . Esophageal cancer Brother   . Cerebral aneurysm Daughter   . Melanoma Daughter   . Cerebral aneurysm Son    Allergies  Allergen Reactions  . Niaspan [Niacin Er] Rash  . Penicillins Rash     Previous Medications   CHOLECALCIFEROL (VITAMIN D) 1000 UNITS TABLET    Take 1,000 Units by mouth daily.   CLOPIDOGREL (PLAVIX) 75 MG TABLET    Take 1 tablet (75 mg total) by mouth daily.   METOPROLOL SUCCINATE (TOPROL-XL) 25 MG 24 HR TABLET    Take 0.5 tablets (12.5 mg total) by mouth daily.   MULTIPLE VITAMIN (MULTIVITAMIN) TABLET    Take 1 tablet by mouth daily.   NAPROXEN SODIUM (ANAPROX) 220 MG TABLET    Take 220 mg by mouth as needed.   NITROGLYCERIN (NITROSTAT) 0.4 MG SL TABLET    Place 0.4 mg under the tongue every 5 (five) minutes as needed for chest pain.   ONE TOUCH ULTRA TEST TEST STRIP    Check fasting blood sugar once a day   ONETOUCH DELICA LANCETS FINE MISC    One finger stick daily for fasting blood sugar check.   PANTOPRAZOLE (PROTONIX) 40 MG TABLET    TAKE 1 TABLET EVERY DAY - pt takes Mon, Wed, Fri   RAMIPRIL (ALTACE) 5 MG CAPSULE    Take 1 capsule (5 mg total) by mouth daily.   ROSUVASTATIN (CRESTOR) 5 MG TABLET    Take 2 tablets (10 mg total) by mouth daily.    Review of Systems  Constitutional: Negative.   Respiratory: Negative.   Cardiovascular: Negative.   Endocrine: Negative.   Musculoskeletal: Negative.     Social History  Substance Use Topics  . Smoking status: Never Smoker  . Smokeless tobacco: Never Used  . Alcohol use No   Objective:   BP 124/68 (BP Location: Right Arm, Patient Position: Sitting, Cuff Size: Normal)   Pulse 60   Temp 97.9 F (36.6 C) (Oral)   Resp 14   Wt 178 lb 9.6 oz (81 kg)   SpO2 98%   BMI 27.16 kg/m  Wt Readings from Last 3 Encounters:  12/14/16 178 lb 9.6 oz (81 kg)  09/07/16 175 lb (79.4 kg)  07/25/16 173 lb 4 oz (78.6 kg)    Physical Exam  Constitutional: He is oriented to person, place, and time. He  appears well-developed and well-nourished. No distress.  HENT:  Head: Normocephalic and atraumatic.  Right Ear: Hearing normal.  Left Ear: Hearing normal.  Nose: Nose normal.  Eyes: Conjunctivae and lids are normal. Right eye exhibits no discharge. Left eye exhibits no discharge. No scleral icterus.  Neck: Neck supple.  Cardiovascular: Normal rate.   Pulmonary/Chest: Effort normal and breath sounds normal. No respiratory distress.  Abdominal: Soft. Bowel sounds are normal.  Musculoskeletal: Normal range of motion.  Neurological: He is  alert and oriented to person, place, and time.  Skin: Skin is intact. No lesion and no rash noted.  Psychiatric: He has a normal mood and affect. His speech is normal and behavior is normal. Thought content normal.      Assessment & Plan:     1. Type 2 diabetes mellitus with complication, without long-term current use of insulin (HCC) Trying to get back on diet after the holidays. Goes to a DM class every Monday [facility]. Tolerating diabetic diet and FBS in the 140's the past week. Last Hgb A1C was 6.8 on 09-07-16. Will recheck labs to see if additional medications needed. - Hemoglobin A1c - Comprehensive metabolic panel - CBC with Differential/Platelet  2. Essential hypertension Stable and well controlled. Tolerating Metoprolol and Ramipril without side effects. Will recheck labs and follow up pending reports. - Comprehensive metabolic panel - CBC with Differential/Platelet  3. Dyslipidemia, goal LDL below 70 Tolerating Crestor 5 mg qd without side effects. Last lipid panel on 09-07-16 showed total cholesterol 108 with triglycerides 56, LDL 60 and HDL 37. Will continue present diet and medications and recheck in 3-4 months. - Comprehensive metabolic panel

## 2016-12-15 LAB — CBC WITH DIFFERENTIAL/PLATELET
BASOS ABS: 0 10*3/uL (ref 0.0–0.2)
Basos: 1 %
EOS (ABSOLUTE): 0.2 10*3/uL (ref 0.0–0.4)
Eos: 3 %
HEMATOCRIT: 46.2 % (ref 37.5–51.0)
HEMOGLOBIN: 16 g/dL (ref 13.0–17.7)
Immature Grans (Abs): 0 10*3/uL (ref 0.0–0.1)
Immature Granulocytes: 0 %
LYMPHS ABS: 1.6 10*3/uL (ref 0.7–3.1)
Lymphs: 25 %
MCH: 30.4 pg (ref 26.6–33.0)
MCHC: 34.6 g/dL (ref 31.5–35.7)
MCV: 88 fL (ref 79–97)
MONOCYTES: 11 %
MONOS ABS: 0.7 10*3/uL (ref 0.1–0.9)
NEUTROS ABS: 4 10*3/uL (ref 1.4–7.0)
Neutrophils: 60 %
Platelets: 164 10*3/uL (ref 150–379)
RBC: 5.27 x10E6/uL (ref 4.14–5.80)
RDW: 13.1 % (ref 12.3–15.4)
WBC: 6.5 10*3/uL (ref 3.4–10.8)

## 2016-12-15 LAB — COMPREHENSIVE METABOLIC PANEL
A/G RATIO: 1.7 (ref 1.2–2.2)
ALBUMIN: 4.7 g/dL (ref 3.5–4.7)
ALK PHOS: 63 IU/L (ref 39–117)
ALT: 27 IU/L (ref 0–44)
AST: 21 IU/L (ref 0–40)
BILIRUBIN TOTAL: 0.7 mg/dL (ref 0.0–1.2)
BUN / CREAT RATIO: 24 (ref 10–24)
BUN: 15 mg/dL (ref 8–27)
CHLORIDE: 99 mmol/L (ref 96–106)
CO2: 28 mmol/L (ref 18–29)
Calcium: 9.1 mg/dL (ref 8.6–10.2)
Creatinine, Ser: 0.62 mg/dL — ABNORMAL LOW (ref 0.76–1.27)
GFR calc Af Amer: 107 mL/min/{1.73_m2} (ref >59)
GFR calc non Af Amer: 92 mL/min/{1.73_m2} (ref >59)
GLOBULIN, TOTAL: 2.8 g/dL (ref 1.5–4.5)
Glucose: 135 mg/dL — ABNORMAL HIGH (ref 65–99)
Potassium: 4.7 mmol/L (ref 3.5–5.2)
SODIUM: 140 mmol/L (ref 134–144)
Total Protein: 7.5 g/dL (ref 6.0–8.5)

## 2016-12-15 LAB — HEMOGLOBIN A1C
Est. average glucose Bld gHb Est-mCnc: 137 mg/dL
Hgb A1c MFr Bld: 6.4 % — ABNORMAL HIGH (ref 4.8–5.6)

## 2017-01-07 ENCOUNTER — Encounter: Payer: Self-pay | Admitting: Podiatry

## 2017-01-07 ENCOUNTER — Ambulatory Visit (INDEPENDENT_AMBULATORY_CARE_PROVIDER_SITE_OTHER): Payer: PPO | Admitting: Podiatry

## 2017-01-07 DIAGNOSIS — B351 Tinea unguium: Secondary | ICD-10-CM | POA: Diagnosis not present

## 2017-01-07 DIAGNOSIS — M79676 Pain in unspecified toe(s): Secondary | ICD-10-CM | POA: Diagnosis not present

## 2017-01-07 DIAGNOSIS — E119 Type 2 diabetes mellitus without complications: Secondary | ICD-10-CM | POA: Diagnosis not present

## 2017-01-07 NOTE — Progress Notes (Signed)
Complaint:  Visit Type: Patient presents  to my office for preventative foot care services. Complaint: Patient states" my nails have grown long and thick and become painful to walk and wear shoes" Patient has been diagnosed with DM with no foot complications. The patient presents for preventative foot care services. No changes to ROS  Podiatric Exam: Vascular: dorsalis pedis and posterior tibial pulses are palpable bilateral. Capillary return is immediate. Temperature gradient is WNL. Skin turgor WNL  Sensorium: Normal Semmes Weinstein monofilament test. Normal tactile sensation bilaterally. Nail Exam: Pt has thick disfigured discolored nails with subungual debris noted bilateral entire nail hallux through fifth toenails Ulcer Exam: There is no evidence of ulcer or pre-ulcerative changes or infection. Orthopedic Exam: Muscle tone and strength are WNL. No limitations in general ROM. No crepitus or effusions noted. Foot type and digits show no abnormalities. Bony prominences are unremarkable. Skin: No Porokeratosis. No infection or ulcers  Diagnosis:  Onychomycosis, , Pain in right toe, pain in left toes  Treatment & Plan Procedures and Treatment: Consent by patient was obtained for treatment procedures. The patient understood the discussion of treatment and procedures well. All questions were answered thoroughly reviewed. Debridement of mycotic and hypertrophic toenails, 1 through 5 bilateral and clearing of subungual debris. No ulceration, no infection noted.  Return Visit-Office Procedure: Patient instructed to return to the office for a follow up visit 3 months for continued evaluation and treatment.   Amil Bouwman DPM     

## 2017-02-20 ENCOUNTER — Other Ambulatory Visit: Payer: Self-pay | Admitting: Family Medicine

## 2017-02-20 DIAGNOSIS — E1121 Type 2 diabetes mellitus with diabetic nephropathy: Secondary | ICD-10-CM

## 2017-02-21 NOTE — Telephone Encounter (Signed)
LOV 12/14/2016. Charles Sellers, CMA

## 2017-04-01 ENCOUNTER — Ambulatory Visit: Payer: PPO | Admitting: Podiatry

## 2017-05-30 ENCOUNTER — Telehealth: Payer: Self-pay | Admitting: Family Medicine

## 2017-06-18 ENCOUNTER — Ambulatory Visit (INDEPENDENT_AMBULATORY_CARE_PROVIDER_SITE_OTHER): Payer: PPO | Admitting: Family Medicine

## 2017-06-18 ENCOUNTER — Encounter: Payer: Self-pay | Admitting: Family Medicine

## 2017-06-18 VITALS — BP 122/68 | HR 59 | Temp 97.6°F | Wt 177.4 lb

## 2017-06-18 DIAGNOSIS — N181 Chronic kidney disease, stage 1: Secondary | ICD-10-CM

## 2017-06-18 DIAGNOSIS — Z9861 Coronary angioplasty status: Secondary | ICD-10-CM | POA: Diagnosis not present

## 2017-06-18 DIAGNOSIS — E1122 Type 2 diabetes mellitus with diabetic chronic kidney disease: Secondary | ICD-10-CM

## 2017-06-18 DIAGNOSIS — I10 Essential (primary) hypertension: Secondary | ICD-10-CM

## 2017-06-18 DIAGNOSIS — I2510 Atherosclerotic heart disease of native coronary artery without angina pectoris: Secondary | ICD-10-CM | POA: Diagnosis not present

## 2017-06-18 DIAGNOSIS — E785 Hyperlipidemia, unspecified: Secondary | ICD-10-CM

## 2017-06-18 LAB — POCT UA - MICROALBUMIN: Microalbumin Ur, POC: 50 mg/L

## 2017-06-18 NOTE — Progress Notes (Signed)
Patient: Charles Sellers. Male    DOB: 16-Feb-1932   83 y.o.   MRN: [id number] Visit Date: 06/18/2017  Today's Provider: [name], PA   Chief Complaint  Patient presents with  . Hypertension  . Hyperlipidemia  . Diabetes  . Follow-up   Subjective:    HPI  Diabetes Mellitus Type II, Follow-up:   Lab Results  Component Value Date   HGBA1C 6.4 (H) 12/14/2016   HGBA1C 6.8 (H) 09/07/2016   HGBA1C 7.0 (H) 03/02/2016   Last seen for diabetes 6 months ago.  Management since then includes continue diabetic diet, check FBS, and continue DM class at [facility]. He reports good compliance with treatment. He is not having side effects.  Current symptoms include none  Home blood sugar records: fasting range: 130's  Episodes of hypoglycemia? no   Current Insulin Regimen: none Most Recent Eye Exam: 09/10/2016 Weight trend: stable Prior visit with dietician: yes - DM class at [facility] weekly Current diet: diabetic Current exercise: none  ------------------------------------------------------------------------   Hypertension, follow-up:  BP Readings from Last 3 Encounters:  06/18/17 122/68  12/14/16 124/68  09/07/16 122/76    He was last seen for hypertension 6 months ago.  BP at that visit was 124/68. Management since that visit includes continue medications.He reports good compliance with treatment. He is not having side effects.  He is not exercising. Just yardwork He is adherent to low salt diet.   Outside blood pressures are not being checked. He is experiencing none.  Patient denies chest pain, chest pressure/discomfort, irregular heart beat and palpitations.   Cardiovascular risk factors include advanced age (older than 64 for men, 15 for women), diabetes mellitus, dyslipidemia, hypertension and male gender.  Use of agents associated with hypertension: none.   ------------------------------------------------------------------------    Lipid/Cholesterol,  Follow-up:   Last seen for this 6 months ago.  Management since that visit includes continue medication.  Last Lipid Panel:    Component Value Date/Time   CHOL 108 09/07/2016 1111   TRIG 56 09/07/2016 1111   HDL 37 (L) 09/07/2016 1111   CHOLHDL 2.9 09/07/2016 1111   CHOLHDL 3.2 06/06/2008 2020   VLDL 6 06/06/2008 2020   LDLCALC 60 09/07/2016 1111    He reports good compliance with treatment. He is not having side effects.   Wt Readings from Last 3 Encounters:  06/18/17 177 lb 6.4 oz (80.5 kg)  12/14/16 178 lb 9.6 oz (81 kg)  09/07/16 175 lb (79.4 kg)    ------------------------------------------------------------------------ Past Medical History:  Diagnosis Date  . Atherosclerosis   . CAD (coronary artery disease) 2011 & 2009   Echo, EF =>55% 2011/ Echo, EF =>55% 2009  . CAD S/P percutaneous coronary angioplasty 12/13 & 15 2005   PCI - mRCA (Cypher DES 3.5 mm x 18 mm --> 4.56mm); RPL Cypher 2.5 mm x 13 mm;; mLAD Cy a pher DES 3.0 mm x 13 mm (3.5 mm) with Cutting PTCA of D2 ostium;; mid Cx 100% after small OM1.  . Cholecystitis   . Dermatitis   . Diabetes mellitus type II, controlled ([place])    Diet controlled. No medications; he indicates "borderline diabetes"  . Dyslipidemia, goal LDL below 70     on statin  . Hypertension   . Prostate nodule   . Ventral hernia    Past Surgical History:  Procedure Laterality Date  . APPENDECTOMY    . CARDIAC CATHETERIZATION  2005  . Carotid Dopplers  September 2014   Bilateral  internal carotids < 49%.  . CORONARY ANGIOPLASTY WITH STENT PLACEMENT     mRCA Cypher DES 3.5 mm x 18 mm (4.0), RPL Cypher DES 2.5 mm x 13 mm; mLAD 3.0 mm x 13 mm Cypher -> cutting PTCA of jailed D2; previousl PCI of Cx - 100% mid Cx.  Marland Kitchen NM MYOVIEW LTD  06/2012   Small area of basal inferior infarct with no ischemia. Normal EF  . Stress Myoview  2013   normal findings  . TRANSTHORACIC ECHOCARDIOGRAM  February 2014    EF 55%. Mild-moderate MR   Family  History  Problem Relation Age of Onset  . Stroke Mother   . Liver cancer Father   . Diabetes Father   . Esophageal cancer Brother   . Cerebral aneurysm Daughter   . Melanoma Daughter   . Cerebral aneurysm Son    Allergies  Allergen Reactions  . Niaspan [Niacin Er] Rash  . Penicillins Rash     Previous Medications   CHOLECALCIFEROL (VITAMIN D) 1000 UNITS TABLET    Take 1,000 Units by mouth daily.   CLOPIDOGREL (PLAVIX) 75 MG TABLET    Take 1 tablet (75 mg total) by mouth daily.   METOPROLOL SUCCINATE (TOPROL-XL) 25 MG 24 HR TABLET    Take 0.5 tablets (12.5 mg total) by mouth daily.   MULTIPLE VITAMIN (MULTIVITAMIN) TABLET    Take 1 tablet by mouth daily.   NAPROXEN SODIUM (ANAPROX) 220 MG TABLET    Take 220 mg by mouth as needed.   NITROGLYCERIN (NITROSTAT) 0.4 MG SL TABLET    Place 0.4 mg under the tongue every 5 (five) minutes as needed for chest pain.   ONE TOUCH ULTRA TEST TEST STRIP    TEST FASTING BLOOD SUGAR ONE TIME DAILY   ONETOUCH DELICA LANCETS FINE MISC    One finger stick daily for fasting blood sugar check.   PANTOPRAZOLE (PROTONIX) 40 MG TABLET    TAKE 1 TABLET EVERY DAY - pt takes Mon, Wed, Fri   RAMIPRIL (ALTACE) 5 MG CAPSULE    Take 1 capsule (5 mg total) by mouth daily.   ROSUVASTATIN (CRESTOR) 5 MG TABLET    Take 2 tablets (10 mg total) by mouth daily.    Review of Systems  Constitutional: Negative.   Respiratory: Negative.   Cardiovascular: Negative.   Gastrointestinal: Negative.   Endocrine: Negative.   Musculoskeletal: Negative.     Social History  Substance Use Topics  . Smoking status: Never Smoker  . Smokeless tobacco: Never Used  . Alcohol use No   Objective:   BP 122/68 (BP Location: Right Arm, Patient Position: Sitting, Cuff Size: Normal)   Pulse (!) 59   Temp 97.6 F (36.4 C) (Oral)   Wt 177 lb 6.4 oz (80.5 kg)   SpO2 97%   BMI 26.97 kg/m   Physical Exam  Constitutional: He is oriented to person, place, and time. He appears  well-developed and well-nourished. No distress.  HENT:  Head: Normocephalic and atraumatic.  Right Ear: Hearing normal.  Left Ear: Hearing normal.  Nose: Nose normal.  Eyes: Conjunctivae and lids are normal. Right eye exhibits no discharge. Left eye exhibits no discharge. No scleral icterus.  Neck: Neck supple.  Cardiovascular: Normal rate and regular rhythm.   Pulmonary/Chest: Effort normal and breath sounds normal. No respiratory distress.  Abdominal: Bowel sounds are normal.  Musculoskeletal: Normal range of motion.  Neurological: He is alert and oriented to person, place, and time.  Skin: Skin is  intact. No lesion and no rash noted.  Psychiatric: He has a normal mood and affect. His speech is normal and behavior is normal. Thought content normal.      Assessment & Plan:     1. Essential hypertension Stable BP. Good control with Altace 5 mg qd and Metoprolol Succinate 25 mg 1/2 tablet daily. Recheck routine labs and recheck pending reports. - CBC with Differential/Platelet - Comprehensive metabolic panel - Lipid panel  2. Controlled type 2 diabetes mellitus with stage 1 chronic kidney disease, without long-term current use of insulin (HCC) FBS around 130-140 the past week or two. Had last eye exam in October 2017 and foot exam 01-07-17 with trimming of thick fungal nails. Following diet and going to diabetic classes. Continue to check FBS daily and get urine microalbumin today. Recheck labs and follow up pending reports.  - CBC with Differential/Platelet - Comprehensive metabolic panel - Lipid panel - Hemoglobin A1c - POCT UA - Microalbumin  3. Dyslipidemia, goal LDL below 70 Trying to continue low fat diet. Remains active working around his home. Tolerating the Crestor 10 mg qd without side effects. Recheck labs. - Comprehensive metabolic panel - Lipid panel  4. CAD S/P percutaneous coronary angioplasty No chest pains or recent use of nitroglycerine. Scheduled for follow up  with cardiologist (Dr. Ellyn Hack) soon. Still on Plavix daily.

## 2017-06-19 LAB — CBC WITH DIFFERENTIAL/PLATELET
BASOS ABS: 0 10*3/uL (ref 0.0–0.2)
Basos: 1 %
EOS (ABSOLUTE): 0.2 10*3/uL (ref 0.0–0.4)
Eos: 4 %
Hematocrit: 44.6 % (ref 37.5–51.0)
Hemoglobin: 15.5 g/dL (ref 13.0–17.7)
Immature Grans (Abs): 0 10*3/uL (ref 0.0–0.1)
Immature Granulocytes: 0 %
LYMPHS ABS: 1.5 10*3/uL (ref 0.7–3.1)
Lymphs: 27 %
MCH: 30.1 pg (ref 26.6–33.0)
MCHC: 34.8 g/dL (ref 31.5–35.7)
MCV: 87 fL (ref 79–97)
MONOS ABS: 0.8 10*3/uL (ref 0.1–0.9)
Monocytes: 15 %
Neutrophils Absolute: 3 10*3/uL (ref 1.4–7.0)
Neutrophils: 53 %
Platelets: 157 10*3/uL (ref 150–379)
RBC: 5.15 x10E6/uL (ref 4.14–5.80)
RDW: 13.5 % (ref 12.3–15.4)
WBC: 5.5 10*3/uL (ref 3.4–10.8)

## 2017-06-19 LAB — COMPREHENSIVE METABOLIC PANEL
ALK PHOS: 65 IU/L (ref 39–117)
ALT: 24 IU/L (ref 0–44)
AST: 19 IU/L (ref 0–40)
Albumin/Globulin Ratio: 1.7 (ref 1.2–2.2)
Albumin: 4.6 g/dL (ref 3.5–4.7)
BILIRUBIN TOTAL: 1 mg/dL (ref 0.0–1.2)
BUN / CREAT RATIO: 20 (ref 10–24)
BUN: 14 mg/dL (ref 8–27)
CHLORIDE: 100 mmol/L (ref 96–106)
CO2: 26 mmol/L (ref 20–29)
Calcium: 9.3 mg/dL (ref 8.6–10.2)
Creatinine, Ser: 0.71 mg/dL — ABNORMAL LOW (ref 0.76–1.27)
GFR calc Af Amer: 100 mL/min/{1.73_m2} (ref >59)
GFR calc non Af Amer: 87 mL/min/{1.73_m2} (ref >59)
GLUCOSE: 134 mg/dL — AB (ref 65–99)
Globulin, Total: 2.7 g/dL (ref 1.5–4.5)
Potassium: 4.6 mmol/L (ref 3.5–5.2)
Sodium: 142 mmol/L (ref 134–144)
Total Protein: 7.3 g/dL (ref 6.0–8.5)

## 2017-06-19 LAB — LIPID PANEL
CHOLESTEROL TOTAL: 123 mg/dL (ref 100–199)
Chol/HDL Ratio: 3.2 ratio (ref 0.0–5.0)
HDL: 38 mg/dL — ABNORMAL LOW (ref >39)
LDL Calculated: 73 mg/dL (ref 0–99)
TRIGLYCERIDES: 61 mg/dL (ref 0–149)
VLDL CHOLESTEROL CAL: 12 mg/dL (ref 5–40)

## 2017-06-19 LAB — HEMOGLOBIN A1C
ESTIMATED AVERAGE GLUCOSE: 157 mg/dL
Hgb A1c MFr Bld: 7.1 % — ABNORMAL HIGH (ref 4.8–5.6)

## 2017-06-20 ENCOUNTER — Telehealth: Payer: Self-pay

## 2017-06-20 NOTE — Telephone Encounter (Signed)
Patient's wife Shirlee Limerick advised.

## 2017-06-20 NOTE — Telephone Encounter (Signed)
-----   Message from Margo Common, [place] sent at 06/20/2017  8:15 AM EDT ----- Blood sugar elevated and Hgb A1C is worse (above 7). The estimated average blood sugar is in the 157 range. HDL cholesterol is only slightly low. Remainder of lipids are normal. Work on diet and recheck levels in 3 months. If still above the 7 mark at that time, will need diabetes medication.

## 2017-07-25 ENCOUNTER — Other Ambulatory Visit: Payer: Self-pay

## 2017-07-25 MED ORDER — RAMIPRIL 5 MG PO CAPS: 5.0000 mg | ORAL_CAPSULE | Freq: Every day | ORAL | 0 refills | Status: DC

## 2017-07-30 ENCOUNTER — Encounter: Payer: Self-pay | Admitting: Cardiology

## 2017-07-30 ENCOUNTER — Ambulatory Visit (INDEPENDENT_AMBULATORY_CARE_PROVIDER_SITE_OTHER): Payer: PPO | Admitting: Cardiology

## 2017-07-30 VITALS — BP 126/68 | HR 64 | Ht 67.0 in | Wt 180.0 lb

## 2017-07-30 DIAGNOSIS — I10 Essential (primary) hypertension: Secondary | ICD-10-CM | POA: Diagnosis not present

## 2017-07-30 DIAGNOSIS — E785 Hyperlipidemia, unspecified: Secondary | ICD-10-CM

## 2017-07-30 DIAGNOSIS — I2510 Atherosclerotic heart disease of native coronary artery without angina pectoris: Secondary | ICD-10-CM | POA: Diagnosis not present

## 2017-07-30 DIAGNOSIS — R011 Cardiac murmur, unspecified: Secondary | ICD-10-CM

## 2017-07-30 DIAGNOSIS — I340 Nonrheumatic mitral (valve) insufficiency: Secondary | ICD-10-CM | POA: Diagnosis not present

## 2017-07-30 DIAGNOSIS — Z9861 Coronary angioplasty status: Secondary | ICD-10-CM | POA: Diagnosis not present

## 2017-07-30 DIAGNOSIS — I452 Bifascicular block: Secondary | ICD-10-CM

## 2017-07-30 MED ORDER — METOPROLOL SUCCINATE ER 25 MG PO TB24: 12.5000 mg | ORAL_TABLET | Freq: Every day | ORAL | 3 refills | Status: DC

## 2017-07-30 MED ORDER — ROSUVASTATIN CALCIUM 10 MG PO TABS: 10.0000 mg | ORAL_TABLET | Freq: Every day | ORAL | 3 refills | Status: DC

## 2017-07-30 MED ORDER — PANTOPRAZOLE SODIUM 40 MG PO TBEC: DELAYED_RELEASE_TABLET | ORAL | 3 refills | Status: DC

## 2017-07-30 MED ORDER — CLOPIDOGREL BISULFATE 75 MG PO TABS: 75.0000 mg | ORAL_TABLET | Freq: Every day | ORAL | 3 refills | Status: DC

## 2017-07-30 MED ORDER — RAMIPRIL 5 MG PO CAPS: 5.0000 mg | ORAL_CAPSULE | Freq: Every day | ORAL | 3 refills | Status: DC

## 2017-07-30 NOTE — Progress Notes (Signed)
PCP: Margo Common, PA  Clinic Note: Chief Complaint  Patient presents with  . Follow-up    CAD    HPI: Charles Clabo. is a 83 y.o. male with a PMH below who presents today for annual follow-up for CAD-PCI. He is a former patient of Dr. Terance Ice who I started seeing upon Dr. Lowella Fairy retirement  Dorrien Grunder. was last seen on 07/25/2016. He was doing very well at that time. Very active with no complains.  Recent Hospitalizations: None  Studies Personally Reviewed - (if available, images/films reviewed: From Epic Chart or Care Everywhere)  None  Interval History: "Charles Sellers" presents today along with his wife with no complaints. Always active on the go doing exercise, yardwork and any job is confined view. He has had no symptoms whatsoever of chest tightness or pressure with rest or exertion. No exertional dyspnea.  No PND, orthopnea or edema. No palpitations, lightheadedness, dizziness, weakness or syncope/near syncope. No TIA/amaurosis fugax symptoms. No claudication.  ROS: A comprehensive was performed. Review of Systems  Constitutional: Negative for malaise/fatigue and weight loss.  HENT: Negative for nosebleeds.   Respiratory: Negative for cough and shortness of breath.   Gastrointestinal: Negative for blood in stool, heartburn and melena.  Genitourinary: Negative for hematuria.  Musculoskeletal: Positive for joint pain (Normal arthritis pains).  Neurological: Negative for dizziness, focal weakness and weakness.  Endo/Heme/Allergies: Does not bruise/bleed easily.  Psychiatric/Behavioral: Negative for memory loss.  All other systems reviewed and are negative.  I have reviewed and (if needed) personally updated the patient's problem list, medications, allergies, past medical and surgical history, social and family history.   Past Medical History:  Diagnosis Date  . Atherosclerosis   . CAD (coronary artery disease) 2011 & 2009   Echo, EF =>55%  2011/ Echo, EF =>55% 2009  . CAD S/P percutaneous coronary angioplasty 12/13 & 15 2005   PCI - mRCA (Cypher DES 3.5 mm x 18 mm --> 4.19mm); RPL Cypher 2.5 mm x 13 mm;; mLAD Cy a pher DES 3.0 mm x 13 mm (3.5 mm) with Cutting PTCA of D2 ostium;; mid Cx 100% after small OM1.  . Cholecystitis   . Dermatitis   . Diabetes mellitus type II, controlled ([place])    Diet controlled. No medications; he indicates "borderline diabetes"  . Dyslipidemia, goal LDL below 70     on statin  . Hypertension   . Prostate nodule   . Ventral hernia     Past Surgical History:  Procedure Laterality Date  . APPENDECTOMY    . CARDIAC CATHETERIZATION  2005  . Carotid Dopplers  September 2014   Bilateral internal carotids < 49%.  . CORONARY ANGIOPLASTY WITH STENT PLACEMENT     mRCA Cypher DES 3.5 mm x 18 mm (4.0), RPL Cypher DES 2.5 mm x 13 mm; mLAD 3.0 mm x 13 mm Cypher -> cutting PTCA of jailed D2; previousl PCI of Cx - 100% mid Cx.  Marland Kitchen NM MYOVIEW LTD  06/2012   Small area of basal inferior infarct with no ischemia. Normal EF  . Stress Myoview  2013   normal findings  . TRANSTHORACIC ECHOCARDIOGRAM  February 2014    EF 55%. Mild-moderate MR    Current Meds  Medication Sig  . cholecalciferol (VITAMIN D) 1000 UNITS tablet Take 1,000 Units by mouth daily.  . clopidogrel (PLAVIX) 75 MG tablet Take 1 tablet (75 mg total) by mouth daily.  . metoprolol succinate (TOPROL-XL) 25 MG 24 hr  tablet Take 0.5 tablets (12.5 mg total) by mouth daily.  . Multiple Vitamin (MULTIVITAMIN) tablet Take 1 tablet by mouth daily.  . naproxen sodium (ANAPROX) 220 MG tablet Take 220 mg by mouth as needed.  . nitroGLYCERIN (NITROSTAT) 0.4 MG SL tablet Place 0.4 mg under the tongue every 5 (five) minutes as needed for chest pain.  . ONE TOUCH ULTRA TEST test strip TEST FASTING BLOOD SUGAR ONE TIME DAILY  . ONETOUCH DELICA LANCETS FINE MISC One finger stick daily for fasting blood sugar check.  . pantoprazole (PROTONIX) 40 MG tablet TAKE 1  TABLET EVERY DAY - pt takes Mon, Wed, Fri  . ramipril (ALTACE) 5 MG capsule Take 1 capsule (5 mg total) by mouth daily.  . [DISCONTINUED] clopidogrel (PLAVIX) 75 MG tablet Take 1 tablet (75 mg total) by mouth daily.  . [DISCONTINUED] metoprolol succinate (TOPROL-XL) 25 MG 24 hr tablet Take 0.5 tablets (12.5 mg total) by mouth daily.  . [DISCONTINUED] pantoprazole (PROTONIX) 40 MG tablet TAKE 1 TABLET EVERY DAY - pt takes Mon, Wed, Fri  . [DISCONTINUED] ramipril (ALTACE) 5 MG capsule Take 1 capsule (5 mg total) by mouth daily. Please keep 07/30/17 appt for future refills  . [DISCONTINUED] rosuvastatin (CRESTOR) 5 MG tablet Take 2 tablets (10 mg total) by mouth daily.    Allergies  Allergen Reactions  . Niaspan [Niacin Er] Rash  . Penicillins Rash    Social History   Social History  . Marital status: Married    Spouse name: N/A  . Number of children: N/A  . Years of education: N/A   Social History Main Topics  . Smoking status: Never Smoker  . Smokeless tobacco: Never Used  . Alcohol use No  . Drug use: No  . Sexual activity: Not Asked   Other Topics Concern  . None   Social History Narrative   Married father of 3, 9 grandchildren, now 83 great-grandchildren. 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   Using the toilet, but does not do routine exercise. Always on the go.   Never smoked. Does not drink alcohol.    family history includes Cerebral aneurysm in his daughter and son; Diabetes in his father; Esophageal cancer in his brother; Liver cancer in his father; Melanoma in his daughter; Stroke in his mother.  Wt Readings from Last 3 Encounters:  07/30/17 180 lb (81.6 kg)  06/18/17 177 lb 6.4 oz (80.5 kg)  12/14/16 178 lb 9.6 oz (81 kg)    PHYSICAL EXAM BP 126/68   Pulse 64   Ht 5\' 7"  (1.702 m)   Wt 180 lb (81.6 kg)   BMI 28.19 kg/m  Physical Exam    Constitutional: He appears well-developed and well-nourished. No distress.  Appears younger than stated age. Well groomed.  HENT:  Head: Normocephalic.  Neck: Normal range of motion. No hepatojugular reflux and no JVD present. Carotid bruit is not present.  Cardiovascular: Normal rate, regular rhythm and intact distal pulses.   Occasional extrasystoles are present. PMI is not displaced.  Exam reveals no gallop.   Murmur heard.  Medium-pitched blowing plateau holosystolic murmur is present  at the lower left sternal border, apex Nursing note and vitals reviewed.    Adult ECG Report  Rate: 64 ;  Rhythm: normal sinus rhythm, premature atrial contractions (PAC) and in bigeminy pattern. RBBB. 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 PACs and bigeminy also on   Other studies Reviewed: Additional studies/ records that were reviewed today include:  Recent Labs:   Lab Results  Component Value Date   CHOL 123 06/18/2017   HDL 38 (L) 06/18/2017   LDLCALC 73 06/18/2017   TRIG 61 06/18/2017   CHOLHDL 3.2 06/18/2017   Lab Results  Component Value Date   CREATININE 0.71 (L) 06/18/2017   BUN 14 06/18/2017   NA 142 06/18/2017   K 4.6 06/18/2017   CL 100 06/18/2017   CO2 26 06/18/2017    ASSESSMENT / PLAN: Problem List Items Addressed This Visit    CAD S/P percutaneous coronary angioplasty - Primary (Chronic)    Multivessel PCI in the past. No recurrent symptoms since. Remains very active with no complaints. He is on stable dose of beta blocker and ACE inhibitor along with low-dose statin. He takes Plavix without aspirin. I'm a bit concerned about the notable changes on his EKG.  In the absence of any scheming symptoms, I'm reluctant to do an ischemic evaluation, but I am been evaluated with an echocardiogram because of his worsening murmur and these changes. If there is a new wall motion abnormality, we  probably will need a stress test.      Relevant Medications   ramipril (ALTACE) 5 MG capsule   metoprolol succinate (TOPROL-XL) 25 MG 24 hr tablet   rosuvastatin (CRESTOR) 10 MG tablet   Other Relevant Orders   EKG 12-Lead (Completed)   ECHOCARDIOGRAM COMPLETE   Dyslipidemia, goal LDL below 70 (Chronic)    On low-dose Crestor (10 mg). Labs reviewed today showed total cholesterol 123, triglycerides 61, HDL 38 and LDL 73. Pretty much at goal. I think were fine continuing current meds. His LFTs are normal as well.      Relevant Medications   ramipril (ALTACE) 5 MG capsule   metoprolol succinate (TOPROL-XL) 25 MG 24 hr tablet   rosuvastatin (CRESTOR) 10 MG tablet   Essential hypertension (Chronic)    Well-controlled on current meds. No change      Relevant Medications   ramipril (ALTACE) 5 MG capsule   metoprolol succinate (TOPROL-XL) 25 MG 24 hr tablet   rosuvastatin (CRESTOR) 10 MG tablet   Other Relevant Orders   EKG 12-Lead (Completed)   ECHOCARDIOGRAM COMPLETE   Mitral regurgitation (Chronic)    He did have a history of some MR on his last echo and the murmur sounds louder today. Plan: Check echo to reevaluate status of MR also concern for possible ischemic MR.      Relevant Medications   ramipril (ALTACE) 5 MG capsule   metoprolol succinate (TOPROL-XL) 25 MG 24 hr tablet   rosuvastatin (CRESTOR) 10 MG tablet   Right BBB/left ant fasc block    New findings on EKG which are more concerning in combination for possible ischemic etiology than just right bundle branch by itself. Just to evaluate for any new wall motion abnormalities along the murmur/MR, we are checking a 2-D echo. Depending on the results, may need to do an ischemic evaluation with stress test.      Relevant Medications   ramipril (ALTACE) 5 MG capsule   metoprolol succinate (TOPROL-XL) 25 MG 24 hr tablet   rosuvastatin (CRESTOR) 10 MG tablet   Other Relevant Orders   EKG 12-Lead (Completed)    Other  Visit Diagnoses    Murmur  Relevant Orders   EKG 12-Lead (Completed)   ECHOCARDIOGRAM COMPLETE      Current medicines are reviewed at length with the patient today. (+/- concerns) n/a The following changes have been made: n/a  Patient Instructions  SCHEDULE AT [facility] Your physician has requested that you have an echocardiogram. Echocardiography is a painless test that uses sound waves to create images of your heart. It provides your doctor with information about the size and shape of your heart and how well your heart's chambers and valves are working. This procedure takes approximately one hour. There are no restrictions for this procedure.  .  Your physician wants you to follow-up in [place].You will receive a reminder letter in the mail two months in advance. If you don't receive a letter, please call our office to schedule the follow-up appointment.   If you need a refill on your cardiac medications before your next appointment, please call your pharmacy.    Studies Ordered:   Orders Placed This Encounter  Procedures  . EKG 12-Lead  . ECHOCARDIOGRAM COMPLETE      Glenetta Hew, M.D., M.S. Interventional Cardiologist   Pager # [phone] Phone # [phone] [address] [place], [place] [address]

## 2017-07-30 NOTE — Patient Instructions (Addendum)
SCHEDULE AT [facility] Your physician has requested that you have an echocardiogram. Echocardiography is a painless test that uses sound waves to create images of your heart. It provides your doctor with information about the size and shape of your heart and how well your heart's chambers and valves are working. This procedure takes approximately one hour. There are no restrictions for this procedure.  .  Your physician wants you to follow-up in [place].You will receive a reminder letter in the mail two months in advance. If you don't receive a letter, please call our office to schedule the follow-up appointment.   If you need a refill on your cardiac medications before your next appointment, please call your pharmacy.

## 2017-08-01 ENCOUNTER — Telehealth: Payer: Self-pay | Admitting: Cardiology

## 2017-08-01 ENCOUNTER — Encounter: Payer: Self-pay | Admitting: Cardiology

## 2017-08-01 DIAGNOSIS — I452 Bifascicular block: Secondary | ICD-10-CM

## 2017-08-01 HISTORY — DX: Bifascicular block: I45.2

## 2017-08-01 NOTE — Assessment & Plan Note (Signed)
Multivessel PCI in the past. No recurrent symptoms since. Remains very active with no complaints. He is on stable dose of beta blocker and ACE inhibitor along with low-dose statin. He takes Plavix without aspirin. I'm a bit concerned about the notable changes on his EKG.  In the absence of any scheming symptoms, I'm reluctant to do an ischemic evaluation, but I am been evaluated with an echocardiogram because of his worsening murmur and these changes. If there is a new wall motion abnormality, we probably will need a stress test.

## 2017-08-01 NOTE — Assessment & Plan Note (Signed)
Well-controlled on current meds.  No change 

## 2017-08-01 NOTE — Assessment & Plan Note (Signed)
On low-dose Crestor (10 mg). Labs reviewed today showed total cholesterol 123, triglycerides 61, HDL 38 and LDL 73. Pretty much at goal. I think were fine continuing current meds. His LFTs are normal as well.

## 2017-08-01 NOTE — Assessment & Plan Note (Signed)
He did have a history of some MR on his last echo and the murmur sounds louder today. Plan: Check echo to reevaluate status of MR also concern for possible ischemic MR.

## 2017-08-01 NOTE — Assessment & Plan Note (Signed)
New findings on EKG which are more concerning in combination for possible ischemic etiology than just right bundle branch by itself. Just to evaluate for any new wall motion abnormalities along the murmur/MR, we are checking a 2-D echo. Depending on the results, may need to do an ischemic evaluation with stress test.

## 2017-08-01 NOTE — Telephone Encounter (Signed)
Left message for patient to call me back.  He has an echo scheduled for September 11 at 2:00 in [place] office.

## 2017-08-02 NOTE — Telephone Encounter (Signed)
Date, time, and location given to wife

## 2017-08-02 NOTE — Telephone Encounter (Signed)
F/u message  Pt call requesting to speak with Rn about ultrasound; to be schedule in [place]. Please call back to discuss

## 2017-08-05 DIAGNOSIS — H2511 Age-related nuclear cataract, right eye: Secondary | ICD-10-CM | POA: Diagnosis not present

## 2017-08-05 LAB — HM DIABETES EYE EXAM

## 2017-08-09 ENCOUNTER — Encounter: Payer: Self-pay | Admitting: Family Medicine

## 2017-08-14 ENCOUNTER — Other Ambulatory Visit: Payer: Self-pay | Admitting: Cardiology

## 2017-08-14 DIAGNOSIS — R011 Cardiac murmur, unspecified: Secondary | ICD-10-CM

## 2017-08-14 DIAGNOSIS — Z9861 Coronary angioplasty status: Principal | ICD-10-CM

## 2017-08-14 DIAGNOSIS — I2510 Atherosclerotic heart disease of native coronary artery without angina pectoris: Secondary | ICD-10-CM

## 2017-08-20 ENCOUNTER — Ambulatory Visit (INDEPENDENT_AMBULATORY_CARE_PROVIDER_SITE_OTHER): Payer: PPO

## 2017-08-20 ENCOUNTER — Other Ambulatory Visit: Payer: Self-pay

## 2017-08-20 DIAGNOSIS — I2510 Atherosclerotic heart disease of native coronary artery without angina pectoris: Secondary | ICD-10-CM

## 2017-08-20 DIAGNOSIS — Z9861 Coronary angioplasty status: Secondary | ICD-10-CM

## 2017-08-20 DIAGNOSIS — R011 Cardiac murmur, unspecified: Secondary | ICD-10-CM | POA: Diagnosis not present

## 2017-09-04 ENCOUNTER — Telehealth: Payer: Self-pay | Admitting: *Deleted

## 2017-09-04 DIAGNOSIS — I350 Nonrheumatic aortic (valve) stenosis: Secondary | ICD-10-CM | POA: Insufficient documentation

## 2017-09-04 DIAGNOSIS — I340 Nonrheumatic mitral (valve) insufficiency: Secondary | ICD-10-CM

## 2017-09-04 NOTE — Telephone Encounter (Signed)
Spoke to patient. Result given . Verbalized understanding PATIENT AWARE WILL NEED AN ANNUAL FOLLOW UP IN 2019 AND FOLLOW UP APPOINTMENT AT THAT TIME.

## 2017-09-04 NOTE — Telephone Encounter (Signed)
-----   Message from Leonie Man, MD sent at 08/29/2017  1:02 PM EDT ----- Echocardiogram results shows slightly thickened left ventriclewith abnormal relaxation (not unexpected for age).  Normal left ventricle her pump function with ejection fraction of 55-60%. Murmurs are related to mild aortic stenosis and possibly moderate mitral regurgitation. -> No regional wall motion abnormalities. At this point, would likely follow-up annual echo to make sure things are stable, but would not need to do a stress test.  Glenetta Hew, MD

## 2017-09-04 NOTE — Telephone Encounter (Signed)
LEFT MESSAGE TO CALL BACK FOR RESULTS.

## 2017-10-18 ENCOUNTER — Ambulatory Visit (INDEPENDENT_AMBULATORY_CARE_PROVIDER_SITE_OTHER): Payer: PPO

## 2017-10-18 VITALS — BP 136/82 | HR 76 | Temp 98.2°F | Ht 67.0 in | Wt 180.0 lb

## 2017-10-18 DIAGNOSIS — Z0000 Encounter for general adult medical examination without abnormal findings: Secondary | ICD-10-CM

## 2017-10-18 DIAGNOSIS — Z23 Encounter for immunization: Secondary | ICD-10-CM | POA: Diagnosis not present

## 2017-10-18 NOTE — Patient Instructions (Addendum)
Charles Sellers , Thank you for taking time to come for your Medicare Wellness Visit. I appreciate your ongoing commitment to your health goals. Please review the following plan we discussed and let me know if I can assist you in the future.   Screening recommendations/referrals: Colonoscopy: no longer required Recommended yearly ophthalmology/optometry visit for glaucoma screening and checkup Recommended yearly dental visit for hygiene and checkup  Vaccinations: Influenza vaccine: completed Pneumococcal vaccine: completed series Tdap vaccine: up to date Shingles vaccine: declined  Advanced directives: Please bring a copy of your POA (Power of Attorney) and/or Living Will to your next appointment.   Conditions/risks identified: Recommend increasing water intake to 6-8 glasses a day.   Next appointment: 01/02/18 2 9:00 AM  Preventive Care 65 Years and Older, Male Preventive care refers to lifestyle choices and visits with your health care provider that can promote health and wellness. What does preventive care include?  A yearly physical exam. This is also called an annual well check.  Dental exams once or twice a year.  Routine eye exams. Ask your health care provider how often you should have your eyes checked.  Personal lifestyle choices, including:  Daily care of your teeth and gums.  Regular physical activity.  Eating a healthy diet.  Avoiding tobacco and drug use.  Limiting alcohol use.  Practicing safe sex.  Taking low doses of aspirin every day.  Taking vitamin and mineral supplements as recommended by your health care provider. What happens during an annual well check? The services and screenings done by your health care provider during your annual well check will depend on your age, overall health, lifestyle risk factors, and family history of disease. Counseling  Your health care provider may ask you questions about your:  Alcohol use.  Tobacco use.  Drug  use.  Emotional well-being.  Home and relationship well-being.  Sexual activity.  Eating habits.  History of falls.  Memory and ability to understand (cognition).  Work and work Statistician. Screening  You may have the following tests or measurements:  Height, weight, and BMI.  Blood pressure.  Lipid and cholesterol levels. These may be checked every 5 years, or more frequently if you are over 38 years old.  Skin check.  Lung cancer screening. You may have this screening every year starting at age 79 if you have a 30-pack-year history of smoking and currently smoke or have quit within the past 15 years.  Fecal occult blood test (FOBT) of the stool. You may have this test every year starting at age 44.  Flexible sigmoidoscopy or colonoscopy. You may have a sigmoidoscopy every 5 years or a colonoscopy every 10 years starting at age 93.  Prostate cancer screening. Recommendations will vary depending on your family history and other risks.  Hepatitis C blood test.  Hepatitis B blood test.  Sexually transmitted disease (STD) testing.  Diabetes screening. This is done by checking your blood sugar (glucose) after you have not eaten for a while (fasting). You may have this done every 1-3 years.  Abdominal aortic aneurysm (AAA) screening. You may need this if you are a current or former smoker.  Osteoporosis. You may be screened starting at age 45 if you are at high risk. Talk with your health care provider about your test results, treatment options, and if necessary, the need for more tests. Vaccines  Your health care provider may recommend certain vaccines, such as:  Influenza vaccine. This is recommended every year.  Tetanus, diphtheria, and  acellular pertussis (Tdap, Td) vaccine. You may need a Td booster every 10 years.  Zoster vaccine. You may need this after age 6.  Pneumococcal 13-valent conjugate (PCV13) vaccine. One dose is recommended after age  26.  Pneumococcal polysaccharide (PPSV23) vaccine. One dose is recommended after age 52. Talk to your health care provider about which screenings and vaccines you need and how often you need them. This information is not intended to replace advice given to you by your health care provider. Make sure you discuss any questions you have with your health care provider. Document Released: 12/23/2015 Document Revised: 08/15/2016 Document Reviewed: 09/27/2015 Elsevier Interactive Patient Education  2017 [place] Prevention in the Home Falls can cause injuries. They can happen to people of all ages. There are many things you can do to make your home safe and to help prevent falls. What can I do on the outside of my home?  Regularly fix the edges of walkways and driveways and fix any cracks.  Remove anything that might make you trip as you walk through a door, such as a raised step or threshold.  Trim any bushes or trees on the path to your home.  Use bright outdoor lighting.  Clear any walking paths of anything that might make someone trip, such as rocks or tools.  Regularly check to see if handrails are loose or broken. Make sure that both sides of any steps have handrails.  Any raised decks and porches should have guardrails on the edges.  Have any leaves, snow, or ice cleared regularly.  Use sand or salt on walking paths during winter.  Clean up any spills in your garage right away. This includes oil or grease spills. What can I do in the bathroom?  Use night lights.  Install grab bars by the toilet and in the tub and shower. Do not use towel bars as grab bars.  Use non-skid mats or decals in the tub or shower.  If you need to sit down in the shower, use a plastic, non-slip stool.  Keep the floor dry. Clean up any water that spills on the floor as soon as it happens.  Remove soap buildup in the tub or shower regularly.  Attach bath mats securely with double-sided  non-slip rug tape.  Do not have throw rugs and other things on the floor that can make you trip. What can I do in the bedroom?  Use night lights.  Make sure that you have a light by your bed that is easy to reach.  Do not use any sheets or blankets that are too big for your bed. They should not hang down onto the floor.  Have a firm chair that has side arms. You can use this for support while you get dressed.  Do not have throw rugs and other things on the floor that can make you trip. What can I do in the kitchen?  Clean up any spills right away.  Avoid walking on wet floors.  Keep items that you use a lot in easy-to-reach places.  If you need to reach something above you, use a strong step stool that has a grab bar.  Keep electrical cords out of the way.  Do not use floor polish or wax that makes floors slippery. If you must use wax, use non-skid floor wax.  Do not have throw rugs and other things on the floor that can make you trip. What can I do with my stairs?  Do not leave any items on the stairs.  Make sure that there are handrails on both sides of the stairs and use them. Fix handrails that are broken or loose. Make sure that handrails are as long as the stairways.  Check any carpeting to make sure that it is firmly attached to the stairs. Fix any carpet that is loose or worn.  Avoid having throw rugs at the top or bottom of the stairs. If you do have throw rugs, attach them to the floor with carpet tape.  Make sure that you have a light switch at the top of the stairs and the bottom of the stairs. If you do not have them, ask someone to add them for you. What else can I do to help prevent falls?  Wear shoes that:  Do not have high heels.  Have rubber bottoms.  Are comfortable and fit you well.  Are closed at the toe. Do not wear sandals.  If you use a stepladder:  Make sure that it is fully opened. Do not climb a closed stepladder.  Make sure that both  sides of the stepladder are locked into place.  Ask someone to hold it for you, if possible.  Clearly mark and make sure that you can see:  Any grab bars or handrails.  First and last steps.  Where the edge of each step is.  Use tools that help you move around (mobility aids) if they are needed. These include:  Canes.  Walkers.  Scooters.  Crutches.  Turn on the lights when you go into a dark area. Replace any light bulbs as soon as they burn out.  Set up your furniture so you have a clear path. Avoid moving your furniture around.  If any of your floors are uneven, fix them.  If there are any pets around you, be aware of where they are.  Review your medicines with your doctor. Some medicines can make you feel dizzy. This can increase your chance of falling. Ask your doctor what other things that you can do to help prevent falls. This information is not intended to replace advice given to you by your health care provider. Make sure you discuss any questions you have with your health care provider. Document Released: 09/22/2009 Document Revised: 05/03/2016 Document Reviewed: 12/31/2014 Elsevier Interactive Patient Education  2017 [employer].

## 2017-10-18 NOTE — Progress Notes (Signed)
Subjective:   Charles Sellers. is a 83 y.o. male who presents for Medicare Annual/Subsequent preventive examination.  Review of Systems:  N/A  Cardiac Risk Factors include: advanced age (>2men, >20 women);diabetes mellitus;dyslipidemia;hypertension;male gender     Objective:    Vitals: BP 136/82 (BP Location: Left Arm)   Pulse 76   Temp 98.2 F (36.8 C) (Oral)   Ht 5\' 7"  (1.702 m)   Wt 180 lb (81.6 kg)   BMI 28.19 kg/m   Body mass index is 28.19 kg/m.  Tobacco Social History   Tobacco Use  Smoking Status Never Smoker  Smokeless Tobacco Never Used     Counseling given: Not Answered   Past Medical History:  Diagnosis Date  . Atherosclerosis   . CAD (coronary artery disease) 2011 & 2009   Echo, EF =>55% 2011/ Echo, EF =>55% 2009  . CAD S/P percutaneous coronary angioplasty 12/13 & 15 2005   PCI - mRCA (Cypher DES 3.5 mm x 18 mm --> 4.67mm); RPL Cypher 2.5 mm x 13 mm;; mLAD Cy a pher DES 3.0 mm x 13 mm (3.5 mm) with Cutting PTCA of D2 ostium;; mid Cx 100% after small OM1.  . Cholecystitis   . Dermatitis   . Diabetes mellitus type II, controlled ([place])    Diet controlled. No medications; he indicates "borderline diabetes"  . Dyslipidemia, goal LDL below 70     on statin  . Hypertension   . Leaky heart valve   . Prostate nodule   . Ventral hernia    Past Surgical History:  Procedure Laterality Date  . APPENDECTOMY    . CARDIAC CATHETERIZATION  2005  . Carotid Dopplers  September 2014   Bilateral internal carotids < 49%.  . CORONARY ANGIOPLASTY WITH STENT PLACEMENT     mRCA Cypher DES 3.5 mm x 18 mm (4.0), RPL Cypher DES 2.5 mm x 13 mm; mLAD 3.0 mm x 13 mm Cypher -> cutting PTCA of jailed D2; previousl PCI of Cx - 100% mid Cx.  Marland Kitchen NM MYOVIEW LTD  06/2012   Small area of basal inferior infarct with no ischemia. Normal EF  . Stress Myoview  2013   normal findings  . TRANSTHORACIC ECHOCARDIOGRAM  February 2014    EF 55%. Mild-moderate MR   Family History    Problem Relation Age of Onset  . Stroke Mother   . Liver cancer Father   . Diabetes Father   . Esophageal cancer Brother   . Cerebral aneurysm Daughter   . Melanoma Daughter   . Cerebral aneurysm Son    Social History   Substance and Sexual Activity  Sexual Activity Not on file    Outpatient Encounter Medications as of 10/18/2017  Medication Sig  . cholecalciferol (VITAMIN D) 1000 UNITS tablet Take 1,000 Units by mouth daily.  . clopidogrel (PLAVIX) 75 MG tablet Take 1 tablet (75 mg total) by mouth daily.  . metoprolol succinate (TOPROL-XL) 25 MG 24 hr tablet Take 0.5 tablets (12.5 mg total) by mouth daily.  . Multiple Vitamin (MULTIVITAMIN) tablet Take 1 tablet by mouth daily.  . naproxen sodium (ANAPROX) 220 MG tablet Take 220 mg by mouth as needed.  . nitroGLYCERIN (NITROSTAT) 0.4 MG SL tablet Place 0.4 mg under the tongue every 5 (five) minutes as needed for chest pain.  . ONE TOUCH ULTRA TEST test strip TEST FASTING BLOOD SUGAR ONE TIME DAILY  . ONETOUCH DELICA LANCETS FINE MISC One finger stick daily for fasting blood sugar  check.  . pantoprazole (PROTONIX) 40 MG tablet TAKE 1 TABLET EVERY DAY - pt takes Mon, Wed, Fri  . ramipril (ALTACE) 5 MG capsule Take 1 capsule (5 mg total) by mouth daily.  . rosuvastatin (CRESTOR) 10 MG tablet Take 1 tablet (10 mg total) by mouth daily.   No facility-administered encounter medications on file as of 10/18/2017.     Activities of Daily Living In your present state of health, do you have any difficulty performing the following activities: 10/18/2017 06/18/2017  Hearing? N N  Vision? N N  Difficulty concentrating or making decisions? N N  Walking or climbing stairs? N N  Dressing or bathing? N N  Doing errands, shopping? N N  Preparing Food and eating ? N -  Using the Toilet? N -  In the past six months, have you accidently leaked urine? N -  Do you have problems with loss of bowel control? N -  Managing your Medications? N -   Managing your Finances? N -  Housekeeping or managing your Housekeeping? N -  Some recent data might be hidden    Patient Care Team: Chrismon, Vickki Muff, PA as PCP - General (Physician Assistant) Birder Robson, MD as Referring Physician (Ophthalmology) Leonie Man, MD as Consulting Physician (Cardiology)   Assessment:     Exercise Activities and Dietary recommendations Current Exercise Habits: The patient does not participate in regular exercise at present, Exercise limited by: Other - see comments(stays busy working all the time)  Goals    None     Fall Risk Fall Risk  10/18/2017 06/18/2017 01/09/2016 08/31/2015 08/03/2015  Falls in the past year? No No No No No   Depression Screen PHQ 2/9 Scores 10/18/2017 06/18/2017 01/09/2016 08/03/2015  PHQ - 2 Score 0 0 0 0  PHQ- 9 Score - 0 - -    Cognitive Function: Pt declined screening today.        Immunization History  Administered Date(s) Administered  . Influenza, High Dose Seasonal PF 10/18/2017  . Pneumococcal Conjugate-13 01/09/2016  . Pneumococcal Polysaccharide-23 08/04/2008  . Tdap 01/09/2016   Screening Tests Health Maintenance  Topic Date Due  . HEMOGLOBIN A1C  12/19/2017  . FOOT EXAM  01/07/2018  . OPHTHALMOLOGY EXAM  08/05/2018  . TETANUS/TDAP  01/08/2026  . INFLUENZA VACCINE  Completed  . PNA vac Low Risk Adult  Completed      Plan:  I have personally reviewed and addressed the Medicare Annual Wellness questionnaire and have noted the following in the patient's chart:  A. Medical and social history B. Use of alcohol, tobacco or illicit drugs  C. Current medications and supplements D. Functional ability and status E.  Nutritional status F.  Physical activity G. Advance directives H. List of other physicians I.  Hospitalizations, surgeries, and ER visits in previous 12 months J.  [place] such as hearing and vision if needed, cognitive and depression L. Referrals and appointments -  none  In addition, I have reviewed and discussed with patient certain preventive protocols, quality metrics, and best practice recommendations. A written personalized care plan for preventive services as well as general preventive health recommendations were provided to patient.  See attached scanned questionnaire for additional information.   Signed,  Fabio Neighbors, LPN Nurse Health Advisor   MD Recommendations: None.   Reviewed Nurse Health Advisor's note and was available for consultation. Agree with recommendations and plan.

## 2017-11-27 NOTE — Telephone Encounter (Signed)
Visit complete.

## 2017-12-09 ENCOUNTER — Telehealth: Payer: Self-pay | Admitting: Family Medicine

## 2017-12-09 MED ORDER — AZITHROMYCIN 250 MG PO TABS: ORAL_TABLET | ORAL | 0 refills | Status: DC

## 2017-12-09 NOTE — Telephone Encounter (Signed)
Called pt back for more info. Patient states he has sinus pressure, sinus pain, cough and sore throat. No sob, no fever or wheezing. Patient wanted to see if Simona Huh would send in a medication to pharmacy without him having to come in for ov. Please advise?

## 2017-12-09 NOTE — Telephone Encounter (Signed)
Pt states he has a sinus cold and wants to have z-pack called into pharmacy.  States he uses [facility] and would like to have call back once it is called in.

## 2017-12-09 NOTE — Telephone Encounter (Signed)
Can send Z-pak 250 mg 2 tablets by mouth today then one daily for 4 days #6 and schedule recheck in a week if no better.

## 2017-12-09 NOTE — Telephone Encounter (Signed)
Patient advised. Rx sent to pharmacy. 

## 2018-01-02 ENCOUNTER — Ambulatory Visit (INDEPENDENT_AMBULATORY_CARE_PROVIDER_SITE_OTHER): Payer: PPO | Admitting: Family Medicine

## 2018-01-02 ENCOUNTER — Encounter: Payer: Self-pay | Admitting: Family Medicine

## 2018-01-02 VITALS — BP 138/74 | HR 69 | Temp 97.9°F | Ht 67.0 in | Wt 181.2 lb

## 2018-01-02 DIAGNOSIS — E1122 Type 2 diabetes mellitus with diabetic chronic kidney disease: Secondary | ICD-10-CM

## 2018-01-02 DIAGNOSIS — I2510 Atherosclerotic heart disease of native coronary artery without angina pectoris: Secondary | ICD-10-CM | POA: Diagnosis not present

## 2018-01-02 DIAGNOSIS — Z9861 Coronary angioplasty status: Secondary | ICD-10-CM

## 2018-01-02 DIAGNOSIS — N181 Chronic kidney disease, stage 1: Secondary | ICD-10-CM | POA: Diagnosis not present

## 2018-01-02 DIAGNOSIS — I10 Essential (primary) hypertension: Secondary | ICD-10-CM | POA: Diagnosis not present

## 2018-01-02 DIAGNOSIS — M129 Arthropathy, unspecified: Secondary | ICD-10-CM | POA: Diagnosis not present

## 2018-01-02 LAB — POCT UA - MICROALBUMIN: MICROALBUMIN (UR) POC: 50 mg/L

## 2018-01-02 LAB — POCT GLYCOSYLATED HEMOGLOBIN (HGB A1C): Hemoglobin A1C: 7.5

## 2018-01-02 NOTE — Progress Notes (Signed)
Patient: Charles Sellers., Male    DOB: [birth year], 84 y.o.   MRN: [id number] Visit Date: 01/02/2018  Today's Provider: [name], PA   Chief Complaint  Patient presents with  . Annual Exam   Subjective:    Annual physical exam Charles Sellers is a 84 y.o. male who presents today for health maintenance and complete physical. He feels well. He reports exercising none. He reports he is sleeping well.  ----------------------------------------------------------------- Patient had a AWE on 10/18/17. Tdap: 01/09/2016 Flu: 10/18/17   Review of Systems  Constitutional: Negative.   HENT: Negative.   Eyes: Negative.   Respiratory: Negative.   Cardiovascular: Negative.   Gastrointestinal: Negative.   Endocrine: Negative.   Genitourinary: Negative.   Musculoskeletal: Negative.   Skin: Negative.   Allergic/Immunologic: Negative.   Neurological: Negative.   Hematological: Negative.   Psychiatric/Behavioral: Negative.     Social History      He  reports that  has never smoked. he has never used smokeless tobacco. He reports that he does not drink alcohol or use drugs.       Social History   Socioeconomic History  . Marital status: Married    Spouse name: None  . Number of children: None  . Years of education: None  . Highest education level: None  Social Needs  . Financial resource strain: Not hard at all  . Food insecurity - worry: Never true  . Food insecurity - inability: Never true  . Transportation needs - medical: No  . Transportation needs - non-medical: No  Occupational History  . Occupation: retired    Comment: previously worked at [employer]  . Smoking status: Never Smoker  . Smokeless tobacco: Never Used  Substance and Sexual Activity  . Alcohol use: No    Alcohol/week: 0.0 oz  . Drug use: No  . Sexual activity: None  Other Topics Concern  . None  Social History Narrative   Married father of 3, 9  grandchildren, now 45 great-grandchildren. 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   Using the toilet, but does not do routine exercise. Always on the go.   Never smoked. Does not drink alcohol.    Past Medical History:  Diagnosis Date  . Atherosclerosis   . CAD (coronary artery disease) 2011 & 2009   Echo, EF =>55% 2011/ Echo, EF =>55% 2009  . CAD S/P percutaneous coronary angioplasty 12/13 & 15 2005   PCI - mRCA (Cypher DES 3.5 mm x 18 mm --> 4.63mm); RPL Cypher 2.5 mm x 13 mm;; mLAD Cy a pher DES 3.0 mm x 13 mm (3.5 mm) with Cutting PTCA of D2 ostium;; mid Cx 100% after small OM1.  . Cholecystitis   . Dermatitis   . Diabetes mellitus type II, controlled ([place])    Diet controlled. No medications; he indicates "borderline diabetes"  . Dyslipidemia, goal LDL below 70     on statin  . Hypertension   . Leaky heart valve   . Prostate nodule   . Ventral hernia    Patient Active Problem List   Diagnosis Date Noted  . Mild aortic stenosis 09/04/2017  . 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 Mitral regurgitation 07/30/2017  . History of prolonged Q-T interval on ECG 06/22/2015  . Prostate lump 06/22/2015  .  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 Diabetes mellitus, type 2 ([place]) 06/22/2015  . CAD S/P percutaneous coronary angioplasty   . Diabetes mellitus type II, controlled ([place])   . Dyslipidemia, goal LDL below 70   . Essential hypertension   . Arthropathia 02/17/2010  . Chondrocalcinosis of multiple sites 08/11/2008  . Acid reflux 08/11/2008  . Cannot sleep 07/23/2008  . UNSPECIFIED INFLAMMATORY POLYARTHROPATHY 07/20/2008  . MYOSITIS 07/20/2008  . FEVER UNSPECIFIED 07/20/2008  . EXANTHEM 07/20/2008  . Muscle ache 07/16/2008  . Disorder of sacrum 06/19/2008  . Thyrotoxicosis 06/19/2008   Past Surgical History:  Procedure Laterality Date  . APPENDECTOMY    .  CARDIAC CATHETERIZATION  2005  . Carotid Dopplers  September 2014   Bilateral internal carotids < 49%.  . CORONARY ANGIOPLASTY WITH STENT PLACEMENT     mRCA Cypher DES 3.5 mm x 18 mm (4.0), RPL Cypher DES 2.5 mm x 13 mm; mLAD 3.0 mm x 13 mm Cypher -> cutting PTCA of jailed D2; previousl PCI of Cx - 100% mid Cx.  Marland Kitchen NM MYOVIEW LTD  06/2012   Small area of basal inferior infarct with no ischemia. Normal EF  . Stress Myoview  2013   normal findings  . TRANSTHORACIC ECHOCARDIOGRAM  February 2014    EF 55%. Mild-moderate MR   Family History        Family Status  Relation Name Status  . Mother  Deceased at age 12  . Father  Deceased at age 34  . Brother  Deceased  . Daughter  Alive  . Daughter  Alive  . Son 1 Alive  . Son 2 Alive        His family history includes Cerebral aneurysm in his daughter and son; Diabetes in his father; Esophageal cancer in his brother; Liver cancer in his father; Melanoma in his daughter; Stroke in his mother.     Allergies  Allergen Reactions  . Niaspan [Niacin Er] Rash  . Penicillins Rash    Current Outpatient Medications:  .  cholecalciferol (VITAMIN D) 1000 UNITS tablet, Take 1,000 Units by mouth daily., Disp: , Rfl:  .  clopidogrel (PLAVIX) 75 MG tablet, Take 1 tablet (75 mg total) by mouth daily., Disp: 90 tablet, Rfl: 3 .  metoprolol succinate (TOPROL-XL) 25 MG 24 hr tablet, Take 0.5 tablets (12.5 mg total) by mouth daily., Disp: 45 tablet, Rfl: 3 .  Multiple Vitamin (MULTIVITAMIN) tablet, Take 1 tablet by mouth daily., Disp: , Rfl:  .  naproxen sodium (ANAPROX) 220 MG tablet, Take 220 mg by mouth as needed., Disp: , Rfl:  .  nitroGLYCERIN (NITROSTAT) 0.4 MG SL tablet, Place 0.4 mg under the tongue every 5 (five) minutes as needed for chest pain., Disp: , Rfl:  .  ONE TOUCH ULTRA TEST test strip, TEST FASTING BLOOD SUGAR ONE TIME DAILY, Disp: 100 each, Rfl: 3 .  ONETOUCH DELICA LANCETS FINE MISC, One finger stick daily for fasting blood sugar check.,  Disp: 100 each, Rfl: 3 .  pantoprazole (PROTONIX) 40 MG tablet, TAKE 1 TABLET EVERY DAY - pt takes Mon, Wed, Fri, Disp: 45 tablet, Rfl: 3 .  ramipril (ALTACE) 5 MG capsule, Take 1 capsule (5 mg total) by mouth daily., Disp: 90 capsule, Rfl: 3 .  rosuvastatin (CRESTOR) 10 MG tablet, Take 1 tablet (10 mg total) by mouth daily., Disp: 90 tablet, Rfl: 3   Patient Care Team: Cliffie Gingras, Vickki Muff, PA as PCP - General (Physician Assistant) Birder Robson, MD as Referring Physician (  Ophthalmology) Leonie Man, MD as Consulting Physician (Cardiology)      Objective:   Vitals: BP 138/74 (BP Location: Right Arm, Patient Position: Sitting, Cuff Size: Normal)   Pulse 69   Temp 97.9 F (36.6 C) (Oral)   Ht 5\' 7"  (1.702 m)   Wt 181 lb 3.2 oz (82.2 kg)   SpO2 96%   BMI 28.38 kg/m    Physical Exam  Constitutional: He is oriented to person, place, and time. He appears well-developed and well-nourished.  HENT:  Head: Normocephalic and atraumatic.  Right Ear: External ear normal.  Left Ear: External ear normal.  Nose: Nose normal.  Mouth/Throat: Oropharynx is clear and moist.  Eyes: Conjunctivae and EOM are normal. Pupils are equal, round, and reactive to light. Right eye exhibits no discharge.  Neck: Normal range of motion. Neck supple. No tracheal deviation present. No thyromegaly present.  Cardiovascular: Normal rate, regular rhythm, normal heart sounds and intact distal pulses.  No murmur heard. Pulmonary/Chest: Effort normal and breath sounds normal. No respiratory distress. He has no wheezes. He has no rales. He exhibits no tenderness.  Abdominal: Soft. He exhibits no distension and no mass. There is no tenderness. There is no rebound and no guarding.  Musculoskeletal: He exhibits no edema or tenderness.  Arthritic changes in fingers of each hand. Very stiff hips with limited rotational ROM. Stiffness in spine.   Lymphadenopathy:    He has no cervical adenopathy.  Neurological: He is  alert and oriented to person, place, and time. He has normal reflexes. No cranial nerve deficit. He exhibits normal muscle tone. Coordination normal.  Skin: Skin is warm and dry. No rash noted. No erythema.  Psychiatric: He has a normal mood and affect. His behavior is normal. Judgment and thought content normal.   Diabetic Foot Form - Detailed   Diabetic Foot Exam - detailed Diabetic Foot exam was performed with the following findings:  Yes 01/02/2018 10:40 AM  Visual Foot Exam completed.:  Yes  Can the patient see the bottom of their feet?:  Yes Are the shoes appropriate in style and fit?:  Yes Is there swelling or and abnormal foot shape?:  No Is there a claw toe deformity?:  No Is there elevated skin temparature?:  No Is there foot or ankle muscle weakness?:  No Normal Range of Motion:  Yes Pulse Foot Exam completed.:  Yes  Right posterior Tibialias:  Present Left posterior Tibialias:  Present  Right Dorsalis Pedis:  Present Left Dorsalis Pedis:  Present  Sensory Foot Exam Completed.:  Yes Semmes-Weinstein Monofilament Test R Site 1-Great Toe:  Pos L Site 1-Great Toe:  Pos    Comments:  Nails well trimmed with some fungal thickening in all nails.     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 Drinking 3-4 glasses of water daily.      Immunization History  Administered Date(s) Administered  . Influenza, High Dose Seasonal PF 10/18/2017  . Pneumococcal Conjugate-13 01/09/2016  . Pneumococcal Polysaccharide-23 08/04/2008  . Tdap 01/09/2016    Health Maintenance  Topic Date Due  . HEMOGLOBIN A1C  12/19/2017  . FOOT EXAM  01/07/2018  . OPHTHALMOLOGY EXAM  08/05/2018  . TETANUS/TDAP  01/08/2026  .  INFLUENZA VACCINE  Completed  . PNA vac Low Risk Adult  Completed     Discussed health benefits of physical activity, and encouraged him to engage in regular exercise appropriate for his age and condition.    -------------------------------------------------------------------- 1. Controlled type 2 diabetes mellitus with stage 1 chronic kidney disease, without long-term current use of insulin (HCC) FBS in the 140-180 range at home. No polydipsia or hypoglycemic episodes. Normal foot exam with some thick nails from fungus. Hgb A1C 7.5% today with microalbumen 50 mg/L. Still taking the Ramipril 5 mg qd. Will get routine labs. Plans eye exam in the Fall 2019. May need to get back on the Metformin 500 mg qd regularly pending lab reports. - POCT HgB A1C - POCT UA - Microalbumin - CBC with Differential/Platelet - Comprehensive metabolic panel - Lipid panel  2. CAD S/P percutaneous coronary angioplasty Unchanged evaluation by Dr. Ellyn Hack (cardiologist) in August 2018. No recent chest pains, dyspnea, edema or palpitations.  3. Essential hypertension Stable and well controlled. Continues to take Metoprolol and Ramipril. Followed by Dr. Ellyn Hack annually. Will get routine labs. - CBC with Differential/Platelet - Comprehensive metabolic panel - Lipid panel - TSH  4. Arthropathia Stiffness in fingers with nodules and inability to complete a fist - diminished grip. May use Naproxen prn discomfort. Advised to use aids to assist grip - padding on tools or steering wheel. Recheck prn. - CBC with Differential/Platelet    [name], PA  [facility]

## 2018-01-03 ENCOUNTER — Telehealth: Payer: Self-pay

## 2018-01-03 LAB — CBC WITH DIFFERENTIAL/PLATELET
BASOS: 1 %
Basophils Absolute: 0 10*3/uL (ref 0.0–0.2)
EOS (ABSOLUTE): 0.2 10*3/uL (ref 0.0–0.4)
Eos: 3 %
Hematocrit: 45 % (ref 37.5–51.0)
Hemoglobin: 15.3 g/dL (ref 13.0–17.7)
Immature Grans (Abs): 0 10*3/uL (ref 0.0–0.1)
Immature Granulocytes: 0 %
LYMPHS ABS: 1.5 10*3/uL (ref 0.7–3.1)
Lymphs: 25 %
MCH: 30.1 pg (ref 26.6–33.0)
MCHC: 34 g/dL (ref 31.5–35.7)
MCV: 89 fL (ref 79–97)
MONOS ABS: 0.7 10*3/uL (ref 0.1–0.9)
Monocytes: 11 %
NEUTROS ABS: 3.7 10*3/uL (ref 1.4–7.0)
Neutrophils: 60 %
PLATELETS: 162 10*3/uL (ref 150–379)
RBC: 5.08 x10E6/uL (ref 4.14–5.80)
RDW: 13.3 % (ref 12.3–15.4)
WBC: 6.1 10*3/uL (ref 3.4–10.8)

## 2018-01-03 LAB — LIPID PANEL
CHOL/HDL RATIO: 3.3 ratio (ref 0.0–5.0)
Cholesterol, Total: 120 mg/dL (ref 100–199)
HDL: 36 mg/dL — ABNORMAL LOW (ref >39)
LDL CALC: 71 mg/dL (ref 0–99)
Triglycerides: 65 mg/dL (ref 0–149)
VLDL Cholesterol Cal: 13 mg/dL (ref 5–40)

## 2018-01-03 LAB — COMPREHENSIVE METABOLIC PANEL
ALBUMIN: 4.4 g/dL (ref 3.5–4.7)
ALT: 22 IU/L (ref 0–44)
AST: 22 IU/L (ref 0–40)
Albumin/Globulin Ratio: 1.6 (ref 1.2–2.2)
Alkaline Phosphatase: 59 IU/L (ref 39–117)
BUN / CREAT RATIO: 21 (ref 10–24)
BUN: 14 mg/dL (ref 8–27)
Bilirubin Total: 0.8 mg/dL (ref 0.0–1.2)
CALCIUM: 9.3 mg/dL (ref 8.6–10.2)
CO2: 21 mmol/L (ref 20–29)
CREATININE: 0.68 mg/dL — AB (ref 0.76–1.27)
Chloride: 98 mmol/L (ref 96–106)
GFR calc Af Amer: 102 mL/min/{1.73_m2} (ref >59)
GFR, EST NON AFRICAN AMERICAN: 88 mL/min/{1.73_m2} (ref >59)
GLOBULIN, TOTAL: 2.7 g/dL (ref 1.5–4.5)
Glucose: 156 mg/dL — ABNORMAL HIGH (ref 65–99)
Potassium: 4.3 mmol/L (ref 3.5–5.2)
Sodium: 138 mmol/L (ref 134–144)
Total Protein: 7.1 g/dL (ref 6.0–8.5)

## 2018-01-03 LAB — TSH: TSH: 1.01 u[IU]/mL (ref 0.450–4.500)

## 2018-01-03 NOTE — Telephone Encounter (Signed)
-----   Message from [employer], [place] sent at 01/03/2018  8:42 AM EST ----- Blood tests normal except glucose and Hgb A1C higher than last check. Should get back on the Metformin 500 mg qd. Cholesterol OK except HDL is below normal. Recommend Omega-3 Fish Oil 1000-1200 mg qd with Co-Q 10 100 mg qd. Recheck progress in 3 months.

## 2018-01-03 NOTE — Telephone Encounter (Signed)
LMTCB

## 2018-01-06 NOTE — Telephone Encounter (Signed)
LMTCB

## 2018-01-08 NOTE — Telephone Encounter (Signed)
Patient advised. He verbalized understanding. 3 month follow up scheduled.

## 2018-01-24 ENCOUNTER — Other Ambulatory Visit: Payer: Self-pay | Admitting: Family Medicine

## 2018-01-27 ENCOUNTER — Telehealth: Payer: Self-pay | Admitting: Cardiology

## 2018-01-27 ENCOUNTER — Telehealth: Payer: Self-pay | Admitting: Family Medicine

## 2018-01-27 MED ORDER — NITROGLYCERIN 0.4 MG SL SUBL: 0.4000 mg | SUBLINGUAL_TABLET | SUBLINGUAL | 11 refills | Status: DC | PRN

## 2018-01-27 NOTE — Telephone Encounter (Signed)
°*  STAT* If patient is at the pharmacy, call can be transferred to refill team.   1. Which medications need to be refilled? (please list name of each medication and dose if known) nitroGLYCERIN (NITROSTAT) 0.4 MG SL tablet 2. Which pharmacy/location (including street and city if local pharmacy) is medication to be sent to? [facility] [address] [phone] 3. Do they need a 30 day or 90 day supply? 20 pills

## 2018-01-27 NOTE — Telephone Encounter (Signed)
Patient is wanting you to send in a prescription for a zpack for a cold.  I advised him that he would need a appointment for this and he stated that you sent in one for him about a month ago.  He states that he started having symptoms Saturday night.  He states that he is coughing and has a runny nose.  [facility] [place].

## 2018-01-27 NOTE — Telephone Encounter (Signed)
Cold viruses should not be treated with an antibiotic. If having frequent recurrences, should examine for need of something different.

## 2018-01-27 NOTE — Telephone Encounter (Signed)
LMTCB. Patient needs a OV for evaluation of symptoms.

## 2018-01-28 ENCOUNTER — Ambulatory Visit: Payer: PPO | Admitting: Family Medicine

## 2018-01-28 ENCOUNTER — Ambulatory Visit (INDEPENDENT_AMBULATORY_CARE_PROVIDER_SITE_OTHER): Payer: PPO | Admitting: Family Medicine

## 2018-01-28 ENCOUNTER — Encounter: Payer: Self-pay | Admitting: Family Medicine

## 2018-01-28 VITALS — BP 134/68 | HR 92 | Temp 98.1°F | Wt 181.8 lb

## 2018-01-28 DIAGNOSIS — R490 Dysphonia: Secondary | ICD-10-CM | POA: Diagnosis not present

## 2018-01-28 DIAGNOSIS — R6883 Chills (without fever): Secondary | ICD-10-CM | POA: Diagnosis not present

## 2018-01-28 DIAGNOSIS — N181 Chronic kidney disease, stage 1: Secondary | ICD-10-CM

## 2018-01-28 DIAGNOSIS — E1122 Type 2 diabetes mellitus with diabetic chronic kidney disease: Secondary | ICD-10-CM

## 2018-01-28 DIAGNOSIS — R05 Cough: Secondary | ICD-10-CM | POA: Diagnosis not present

## 2018-01-28 DIAGNOSIS — R059 Cough, unspecified: Secondary | ICD-10-CM

## 2018-01-28 LAB — POCT INFLUENZA A/B
INFLUENZA B, POC: NEGATIVE
Influenza A, POC: NEGATIVE

## 2018-01-28 MED ORDER — METFORMIN HCL 500 MG PO TABS: 500.0000 mg | ORAL_TABLET | Freq: Two times a day (BID) | ORAL | 3 refills | Status: DC

## 2018-01-28 MED ORDER — AZITHROMYCIN 250 MG PO TABS: ORAL_TABLET | ORAL | 0 refills | Status: DC

## 2018-01-28 NOTE — Progress Notes (Signed)
Patient: Charles Sellers. Male    DOB: 1932-05-13   84 y.o.   MRN: [id number] Visit Date: 01/28/2018  Today's Provider: [name], PA   Chief Complaint  Patient presents with  . URI   Subjective:    URI   This is a new problem. Episode onset: 1 week ago. The problem has been gradually worsening. There has been no fever. Associated symptoms include congestion and coughing. Associated symptoms comments: Hoarseness . Treatments tried: Benadryl and took a Z-pak 2 weeks ago. The treatment provided no relief.   Past Medical History:  Diagnosis Date  . Atherosclerosis   . CAD (coronary artery disease) 2011 & 2009   Echo, EF =>55% 2011/ Echo, EF =>55% 2009  . CAD S/P percutaneous coronary angioplasty 12/13 & 15 2005   PCI - mRCA (Cypher DES 3.5 mm x 18 mm --> 4.79mm); RPL Cypher 2.5 mm x 13 mm;; mLAD Cy a pher DES 3.0 mm x 13 mm (3.5 mm) with Cutting PTCA of D2 ostium;; mid Cx 100% after small OM1.  . Cholecystitis   . Dermatitis   . Diabetes mellitus type II, controlled ([place])    Diet controlled. No medications; he indicates "borderline diabetes"  . Dyslipidemia, goal LDL below 70     on statin  . Hypertension   . Leaky heart valve   . Prostate nodule   . Ventral hernia    Past Surgical History:  Procedure Laterality Date  . APPENDECTOMY    . CARDIAC CATHETERIZATION  2005  . Carotid Dopplers  September 2014   Bilateral internal carotids < 49%.  . CORONARY ANGIOPLASTY WITH STENT PLACEMENT     mRCA Cypher DES 3.5 mm x 18 mm (4.0), RPL Cypher DES 2.5 mm x 13 mm; mLAD 3.0 mm x 13 mm Cypher -> cutting PTCA of jailed D2; previousl PCI of Cx - 100% mid Cx.  Marland Kitchen NM MYOVIEW LTD  06/2012   Small area of basal inferior infarct with no ischemia. Normal EF  . Stress Myoview  2013   normal findings  . TRANSTHORACIC ECHOCARDIOGRAM  February 2014    EF 55%. Mild-moderate MR   Family History  Problem Relation Age of Onset  . Stroke Mother   . Liver cancer Father   . Diabetes  Father   . Esophageal cancer Brother   . Cerebral aneurysm Daughter   . Melanoma Daughter   . Cerebral aneurysm Son    Allergies  Allergen Reactions  . Niaspan [Niacin Er] Rash  . Penicillins Rash    Current Outpatient Medications:  .  cholecalciferol (VITAMIN D) 1000 UNITS tablet, Take 1,000 Units by mouth daily., Disp: , Rfl:  .  clopidogrel (PLAVIX) 75 MG tablet, Take 1 tablet (75 mg total) by mouth daily., Disp: 90 tablet, Rfl: 3 .  metoprolol succinate (TOPROL-XL) 25 MG 24 hr tablet, Take 0.5 tablets (12.5 mg total) by mouth daily., Disp: 45 tablet, Rfl: 3 .  Multiple Vitamin (MULTIVITAMIN) tablet, Take 1 tablet by mouth daily., Disp: , Rfl:  .  naproxen sodium (ANAPROX) 220 MG tablet, Take 220 mg by mouth as needed., Disp: , Rfl:  .  nitroGLYCERIN (NITROSTAT) 0.4 MG SL tablet, Place 1 tablet (0.4 mg total) under the tongue every 5 (five) minutes as needed for chest pain., Disp: 25 tablet, Rfl: 11 .  ONE TOUCH ULTRA TEST test strip, TEST FASTING BLOOD SUGAR ONE TIME DAILY, Disp: 100 each, Rfl: 3 .  ONETOUCH DELICA  LANCETS FINE MISC, One finger stick daily for fasting blood sugar check., Disp: 100 each, Rfl: 3 .  pantoprazole (PROTONIX) 40 MG tablet, TAKE 1 TABLET EVERY DAY - pt takes Mon, Wed, Fri, Disp: 45 tablet, Rfl: 3 .  ramipril (ALTACE) 5 MG capsule, Take 1 capsule (5 mg total) by mouth daily., Disp: 90 capsule, Rfl: 3 .  rosuvastatin (CRESTOR) 10 MG tablet, Take 1 tablet (10 mg total) by mouth daily., Disp: 90 tablet, Rfl: 3  Review of Systems  Constitutional: Negative.   HENT: Positive for congestion.   Respiratory: Positive for cough.   Cardiovascular: Negative.     Social History   Tobacco Use  . Smoking status: Never Smoker  . Smokeless tobacco: Never Used  Substance Use Topics  . Alcohol use: No    Alcohol/week: 0.0 oz   Objective:   BP 134/68 (BP Location: Right Arm, Patient Position: Sitting, Cuff Size: Normal)   Pulse 92   Temp 98.1 F (36.7 C) (Oral)    Wt 181 lb 12.8 oz (82.5 kg)   SpO2 98%   BMI 28.47 kg/m   Physical Exam  Constitutional: He is oriented to person, place, and time. He appears well-developed and well-nourished.  HENT:  Head: Normocephalic.  Right Ear: External ear normal.  Left Ear: External ear normal.  Injected and slightly red posterior pharynx without exudates.   Eyes: Conjunctivae are normal.  Neck: Neck supple.  Cardiovascular: Normal rate and regular rhythm.  Pulmonary/Chest: Effort normal. He has no wheezes. He has no rales.  Abdominal: Soft. Bowel sounds are normal.  Musculoskeletal: He exhibits tenderness.  Tender stiff and enlarged joints of fingers with slight deviation of some of the DIP joints. No redness. Unable to test grip without increase in pain.  Lymphadenopathy:    He has no cervical adenopathy.  Neurological: He is alert and oriented to person, place, and time.      Assessment & Plan:     1. Cough Developed cough again over the past week with hoarseness and some chills. Had a similar episode a couple weeks ago and took a Z-pak that he keeps at home for emergencies with bronchitis. Flu test negative today and no sign of rales, rhonchi or wheezes are noted. May use Mucinex-DM or Delsym for cough, drink extra fluids and given replacement Z-pak prescription in case purulent sputum or fever develops. - POCT Influenza A/B - azithromycin (ZITHROMAX) 250 MG tablet; Take 2 tablets by mouth the first day then one tablet daily for 4 days.  Dispense: 6 tablet; Refill: 0  2. Voice hoarsenesses Onset the past 1-2 days. Feels "cold" symptoms started when he was working outdoors and took off his coat. "Got a chill that started this cold." Advised the Z-pak won't treat a viral illness. Given routine advise for viral URI treatment at home.  3. Chills No documented fever. Flu test negative. May use Tylenol prn and should drink extra fluids. - POCT Influenza A/B  4. Controlled type 2 diabetes mellitus with  stage 1 chronic kidney disease, without long-term current use of insulin ([place]) Suspect diabetes control will be a little off with this viral illness. Blood tests on 01-03-18 showed Hgb A1C was higher at 7.5%. Recommended he get back on the Metformin. FBS at home the past week has been in the 150-180 range. May proceed with Metformin 500 mg 1-2 times a day. Recheck in 3 months to check DM progress. - metFORMIN (GLUCOPHAGE) 500 MG tablet; Take 1 tablet (500 mg  total) by mouth 2 (two) times daily with a meal.  Dispense: 180 tablet; Refill: [place], [facility]

## 2018-01-28 NOTE — Telephone Encounter (Signed)
Patient scheduled for a OV today.

## 2018-03-05 ENCOUNTER — Other Ambulatory Visit: Payer: Self-pay | Admitting: Family Medicine

## 2018-03-05 DIAGNOSIS — E1121 Type 2 diabetes mellitus with diabetic nephropathy: Secondary | ICD-10-CM

## 2018-04-04 ENCOUNTER — Ambulatory Visit (INDEPENDENT_AMBULATORY_CARE_PROVIDER_SITE_OTHER): Payer: PPO | Admitting: Family Medicine

## 2018-04-04 ENCOUNTER — Encounter: Payer: Self-pay | Admitting: Family Medicine

## 2018-04-04 VITALS — BP 132/70 | HR 60 | Temp 98.2°F | Wt 178.0 lb

## 2018-04-04 DIAGNOSIS — N181 Chronic kidney disease, stage 1: Secondary | ICD-10-CM | POA: Diagnosis not present

## 2018-04-04 DIAGNOSIS — E785 Hyperlipidemia, unspecified: Secondary | ICD-10-CM | POA: Diagnosis not present

## 2018-04-04 DIAGNOSIS — I2510 Atherosclerotic heart disease of native coronary artery without angina pectoris: Secondary | ICD-10-CM

## 2018-04-04 DIAGNOSIS — Z9861 Coronary angioplasty status: Secondary | ICD-10-CM

## 2018-04-04 DIAGNOSIS — E1122 Type 2 diabetes mellitus with diabetic chronic kidney disease: Secondary | ICD-10-CM

## 2018-04-04 MED ORDER — EMPAGLIFLOZIN 10 MG PO TABS: 10.0000 mg | ORAL_TABLET | Freq: Every day | ORAL | 0 refills | Status: DC

## 2018-04-04 NOTE — Progress Notes (Signed)
Patient: Charles Sellers. Male    DOB: December 06, 1932   84 y.o.   MRN: [id number] Visit Date: 04/04/2018  Today's Provider: [name], PA   Chief Complaint  Patient presents with  . Diabetes  . Hyperlipidemia  . Hypertension  . Follow-up   Subjective:    HPI  Diabetes Mellitus Type II, Follow-up:   RecentLabs       Lab Results  Component Value Date   HGBA1C 6.4 (H) 12/14/2016   HGBA1C 6.8 (H) 09/07/2016   HGBA1C 7.0 (H) 03/02/2016     Last seen for diabetes 3 months ago.  Management since then includes continue diabetic diet, check FBS, continue DM class at [place], and restart Metformin 500 mg. He reports fair compliance with treatment. Patient states he restarted Metformin then stopped taking it. He is not having side effects.  Current symptoms include none  Home blood sugar records: average fasting range: 140-170  Episodes of hypoglycemia? no              Current Insulin Regimen: none Most Recent Eye Exam: 08/05/2017 Weight trend: stable Prior visit with dietician: yes  Current diet: diabetic Current exercise: none  ------------------------------------------------------------------------   Hypertension, follow-up:  BP Readings from Last 3 Encounters:  04/04/18 132/70  01/28/18 134/68  01/02/18 138/74    He was last seen for hypertension 3 months ago.  BP at that visit was 134/68. Management since that visit includes continue medications. He reports good compliance with treatment. He is not having side effects.  He is not exercising. Just yardwork He is adherent to low salt diet.   Outside blood pressures are not being checked. He is experiencing none.  Patient denies chest pain, chest pressure/discomfort, irregular heart beat and palpitations.   Cardiovascular risk factors include advanced age (older than 53 for men, 74 for women), diabetes mellitus, dyslipidemia, hypertension and male gender.  Use of agents associated with  hypertension: none.   ------------------------------------------------------------------------  Lipid/Cholesterol, Follow-up:   Last seen for this 3 months ago.  Management since that visit includes recommended Omega 3 Fish Oil 1000-1200 mg and CO-Q 10 100 mg  Last Lipid Panel: Labs(Brief)   Lab Results  Component Value Date   CHOL 120 01/02/2018   HDL 36 (L) 01/02/2018   LDLCALC 71 01/02/2018   TRIG 65 01/02/2018   CHOLHDL 3.3 01/02/2018   He reports good compliance with treatment. He is not having side effects.  Wt Readings from Last 3 Encounters:  04/04/18 178 lb (80.7 kg)  01/28/18 181 lb 12.8 oz (82.5 kg)  01/02/18 181 lb 3.2 oz (82.2 kg)   Past Medical History:  Diagnosis Date  . Atherosclerosis   . CAD (coronary artery disease) 2011 & 2009   Echo, EF =>55% 2011/ Echo, EF =>55% 2009  . CAD S/P percutaneous coronary angioplasty 12/13 & 15 2005   PCI - mRCA (Cypher DES 3.5 mm x 18 mm --> 4.31mm); RPL Cypher 2.5 mm x 13 mm;; mLAD Cy a pher DES 3.0 mm x 13 mm (3.5 mm) with Cutting PTCA of D2 ostium;; mid Cx 100% after small OM1.  . Cholecystitis   . Dermatitis   . Diabetes mellitus type II, controlled ([place])    Diet controlled. No medications; he indicates "borderline diabetes"  . Dyslipidemia, goal LDL below 70     on statin  . Hypertension   . Leaky heart valve   . Prostate nodule   . Ventral hernia  Past Surgical History:  Procedure Laterality Date  . APPENDECTOMY    . CARDIAC CATHETERIZATION  2005  . Carotid Dopplers  September 2014   Bilateral internal carotids < 49%.  . CORONARY ANGIOPLASTY WITH STENT PLACEMENT     mRCA Cypher DES 3.5 mm x 18 mm (4.0), RPL Cypher DES 2.5 mm x 13 mm; mLAD 3.0 mm x 13 mm Cypher -> cutting PTCA of jailed D2; previousl PCI of Cx - 100% mid Cx.  Marland Kitchen NM MYOVIEW LTD  06/2012   Small area of basal inferior infarct with no ischemia. Normal EF  . Stress Myoview  2013   normal findings  . TRANSTHORACIC ECHOCARDIOGRAM  February  2014    EF 55%. Mild-moderate MR   Family History  Problem Relation Age of Onset  . Stroke Mother   . Liver cancer Father   . Diabetes Father   . Esophageal cancer Brother   . Cerebral aneurysm Daughter   . Melanoma Daughter   . Cerebral aneurysm Son    Allergies  Allergen Reactions  . Niaspan [Niacin Er] Rash  . Penicillins Rash    Current Outpatient Medications:  .  APPLE CIDER VINEGAR PO, Take by mouth., Disp: , Rfl:  .  cholecalciferol (VITAMIN D) 1000 UNITS tablet, Take 1,000 Units by mouth daily., Disp: , Rfl:  .  clopidogrel (PLAVIX) 75 MG tablet, Take 1 tablet (75 mg total) by mouth daily., Disp: 90 tablet, Rfl: 3 .  metoprolol succinate (TOPROL-XL) 25 MG 24 hr tablet, Take 0.5 tablets (12.5 mg total) by mouth daily., Disp: 45 tablet, Rfl: 3 .  Multiple Vitamin (MULTIVITAMIN) tablet, Take 1 tablet by mouth daily., Disp: , Rfl:  .  naproxen sodium (ANAPROX) 220 MG tablet, Take 220 mg by mouth as needed., Disp: , Rfl:  .  nitroGLYCERIN (NITROSTAT) 0.4 MG SL tablet, Place 1 tablet (0.4 mg total) under the tongue every 5 (five) minutes as needed for chest pain., Disp: 25 tablet, Rfl: 11 .  ONE TOUCH ULTRA TEST test strip, USE AS DIRECTED ONCE DAILY, Disp: 100 each, Rfl: 4 .  ONETOUCH DELICA LANCETS FINE MISC, One finger stick daily for fasting blood sugar check., Disp: 100 each, Rfl: 3 .  pantoprazole (PROTONIX) 40 MG tablet, TAKE 1 TABLET EVERY DAY - pt takes Mon, Wed, Fri, Disp: 45 tablet, Rfl: 3 .  ramipril (ALTACE) 5 MG capsule, Take 1 capsule (5 mg total) by mouth daily., Disp: 90 capsule, Rfl: 3 .  metFORMIN (GLUCOPHAGE) 500 MG tablet, Take 1 tablet (500 mg total) by mouth 2 (two) times daily with a meal. (Patient not taking: Reported on 04/04/2018), Disp: 180 tablet, Rfl: 3 .  rosuvastatin (CRESTOR) 10 MG tablet, Take 1 tablet (10 mg total) by mouth daily., Disp: 90 tablet, Rfl: 3  Review of Systems  Constitutional: Negative.   Respiratory: Negative.   Cardiovascular:  Negative.   Endocrine: Negative.   Musculoskeletal: Negative.    Social History   Tobacco Use  . Smoking status: Never Smoker  . Smokeless tobacco: Never Used  Substance Use Topics  . Alcohol use: No    Alcohol/week: 0.0 oz   Objective:   BP 132/70 (BP Location: Right Arm, Patient Position: Sitting, Cuff Size: Normal)   Pulse 60   Temp 98.2 F (36.8 C) (Oral)   Wt 178 lb (80.7 kg)   SpO2 96%   BMI 27.88 kg/m   Physical Exam  Constitutional: He is oriented to person, place, and time. He appears well-developed and  well-nourished. No distress.  HENT:  Head: Normocephalic and atraumatic.  Right Ear: Hearing normal.  Left Ear: Hearing normal.  Nose: Nose normal.  Eyes: Conjunctivae and lids are normal. Right eye exhibits no discharge. Left eye exhibits no discharge. No scleral icterus.  Cardiovascular: Normal rate.  Pulmonary/Chest: Effort normal. No respiratory distress.  Musculoskeletal: Normal range of motion.  Neurological: He is alert and oriented to person, place, and time.  Skin: Skin is intact. No lesion and no rash noted.  Psychiatric: He has a normal mood and affect. His speech is normal and behavior is normal. Thought content normal.      Assessment & Plan:      1. Controlled type 2 diabetes mellitus with stage 1 chronic kidney disease, without long-term current use of insulin (HCC) Last Hgb A1C was 7.5% on 01-02-18. Felipa Evener about a friend and a couple family members having problems with the Metformin and will not take it anymore. FBS at home has been between 160-200 the past 2 weeks. Will give trial of Jardiance with his history of CAD. Recheck CBC,CMP and Hgb A1C. Follow up in 3 weeks. - CBC with Differential/Platelet - Comprehensive metabolic panel - Hemoglobin A1c - empagliflozin (JARDIANCE) 10 MG TABS tablet; Take 10 mg by mouth daily.  Dispense: 21 tablet; Refill: 0  2. Dyslipidemia, goal LDL below 70 Tolerating Crestor 10 mg qd without side effects. Trying  to stay on a low fat diabetic diet and frequently works in his yard. Recheck in 3 months. - Comprehensive metabolic panel  3. CAD S/P percutaneous coronary angioplasty Followed by Dr. Ellyn Hack (cardiologist). No chest pains or palpitations. Check routine labs. - CBC with Differential/Platelet       [name], PA  [facility]

## 2018-04-05 LAB — COMPREHENSIVE METABOLIC PANEL
ALK PHOS: 58 IU/L (ref 39–117)
ALT: 21 IU/L (ref 0–44)
AST: 21 IU/L (ref 0–40)
Albumin/Globulin Ratio: 1.8 (ref 1.2–2.2)
Albumin: 4.6 g/dL (ref 3.5–4.7)
BUN/Creatinine Ratio: 26 — ABNORMAL HIGH (ref 10–24)
BUN: 16 mg/dL (ref 8–27)
Bilirubin Total: 0.9 mg/dL (ref 0.0–1.2)
CALCIUM: 8.8 mg/dL (ref 8.6–10.2)
CO2: 23 mmol/L (ref 20–29)
CREATININE: 0.61 mg/dL — AB (ref 0.76–1.27)
Chloride: 104 mmol/L (ref 96–106)
GFR calc Af Amer: 106 mL/min/{1.73_m2} (ref >59)
GFR, EST NON AFRICAN AMERICAN: 92 mL/min/{1.73_m2} (ref >59)
GLOBULIN, TOTAL: 2.5 g/dL (ref 1.5–4.5)
GLUCOSE: 151 mg/dL — AB (ref 65–99)
Potassium: 4.2 mmol/L (ref 3.5–5.2)
Sodium: 142 mmol/L (ref 134–144)
Total Protein: 7.1 g/dL (ref 6.0–8.5)

## 2018-04-05 LAB — CBC WITH DIFFERENTIAL/PLATELET
BASOS ABS: 0 10*3/uL (ref 0.0–0.2)
Basos: 1 %
EOS (ABSOLUTE): 0.2 10*3/uL (ref 0.0–0.4)
EOS: 3 %
HEMATOCRIT: 42.7 % (ref 37.5–51.0)
HEMOGLOBIN: 15.1 g/dL (ref 13.0–17.7)
IMMATURE GRANS (ABS): 0 10*3/uL (ref 0.0–0.1)
Immature Granulocytes: 0 %
LYMPHS ABS: 1.4 10*3/uL (ref 0.7–3.1)
LYMPHS: 25 %
MCH: 30.3 pg (ref 26.6–33.0)
MCHC: 35.4 g/dL (ref 31.5–35.7)
MCV: 86 fL (ref 79–97)
MONOCYTES: 12 %
Monocytes Absolute: 0.7 10*3/uL (ref 0.1–0.9)
Neutrophils Absolute: 3.4 10*3/uL (ref 1.4–7.0)
Neutrophils: 59 %
Platelets: 153 10*3/uL (ref 150–379)
RBC: 4.98 x10E6/uL (ref 4.14–5.80)
RDW: 13.5 % (ref 12.3–15.4)
WBC: 5.7 10*3/uL (ref 3.4–10.8)

## 2018-04-05 LAB — HEMOGLOBIN A1C
Est. average glucose Bld gHb Est-mCnc: 171 mg/dL
Hgb A1c MFr Bld: 7.6 % — ABNORMAL HIGH (ref 4.8–5.6)

## 2018-04-08 ENCOUNTER — Ambulatory Visit: Payer: PPO | Admitting: Family Medicine

## 2018-04-09 ENCOUNTER — Other Ambulatory Visit: Payer: Self-pay | Admitting: Cardiology

## 2018-04-10 NOTE — Telephone Encounter (Signed)
REFILL 

## 2018-04-25 ENCOUNTER — Encounter: Payer: Self-pay | Admitting: Family Medicine

## 2018-04-25 ENCOUNTER — Ambulatory Visit (INDEPENDENT_AMBULATORY_CARE_PROVIDER_SITE_OTHER): Payer: PPO | Admitting: Family Medicine

## 2018-04-25 VITALS — BP 134/74 | HR 56 | Temp 98.0°F | Wt 175.2 lb

## 2018-04-25 DIAGNOSIS — E118 Type 2 diabetes mellitus with unspecified complications: Secondary | ICD-10-CM

## 2018-04-25 MED ORDER — CANAGLIFLOZIN 100 MG PO TABS: 100.0000 mg | ORAL_TABLET | Freq: Every day | ORAL | 1 refills | Status: DC

## 2018-04-25 NOTE — Progress Notes (Signed)
Patient: Charles Sellers. Male    DOB: Jun 23, 1932   84 y.o.   MRN: [id number] Visit Date: 04/25/2018  Today's Provider: [name], PA   Chief Complaint  Patient presents with  . Diabetes  . Follow-up   Subjective:    HPI  Diabetes Mellitus Type II, Follow-up:   Lab Results  Component Value Date   HGBA1C 7.6 (H) 04/04/2018   HGBA1C 7.5 01/02/2018   HGBA1C 7.1 (H) 06/18/2017    Last seen for diabetes 3 weeks ago.  Management since then includes started Jardiance 10 mg daily and advised to check FBS. He reports good compliance with treatment. He is not having side effects.  Current symptoms include none Home blood sugar records: fasting range: 130-160's  Episodes of hypoglycemia? no   Current Insulin Regimen: none Most Recent Eye Exam: 08/05/17 Weight trend: stable Prior visit with dietician: yes  Current diet: diabetic Current exercise: none  Pertinent Labs:    Component Value Date/Time   CHOL 120 01/02/2018 1046   TRIG 65 01/02/2018 1046   HDL 36 (L) 01/02/2018 1046   LDLCALC 71 01/02/2018 1046   CREATININE 0.61 (L) 04/04/2018 0901    Wt Readings from Last 3 Encounters:  04/25/18 175 lb 3.2 oz (79.5 kg)  04/04/18 178 lb (80.7 kg)  01/28/18 181 lb 12.8 oz (82.5 kg)    ------------------------------------------------------------------------     Past Medical History:  Diagnosis Date  . Atherosclerosis   . CAD (coronary artery disease) 2011 & 2009   Echo, EF =>55% 2011/ Echo, EF =>55% 2009  . CAD S/P percutaneous coronary angioplasty 12/13 & 15 2005   PCI - mRCA (Cypher DES 3.5 mm x 18 mm --> 4.55mm); RPL Cypher 2.5 mm x 13 mm;; mLAD Cy a pher DES 3.0 mm x 13 mm (3.5 mm) with Cutting PTCA of D2 ostium;; mid Cx 100% after small OM1.  . Cholecystitis   . Dermatitis   . Diabetes mellitus type II, controlled ([place])    Diet controlled. No medications; he indicates "borderline diabetes"  . Dyslipidemia, goal LDL below 70     on statin  .  Hypertension   . Leaky heart valve   . Prostate nodule   . Ventral hernia    Past Surgical History:  Procedure Laterality Date  . APPENDECTOMY    . CARDIAC CATHETERIZATION  2005  . Carotid Dopplers  September 2014   Bilateral internal carotids < 49%.  . CORONARY ANGIOPLASTY WITH STENT PLACEMENT     mRCA Cypher DES 3.5 mm x 18 mm (4.0), RPL Cypher DES 2.5 mm x 13 mm; mLAD 3.0 mm x 13 mm Cypher -> cutting PTCA of jailed D2; previousl PCI of Cx - 100% mid Cx.  Marland Kitchen NM MYOVIEW LTD  06/2012   Small area of basal inferior infarct with no ischemia. Normal EF  . Stress Myoview  2013   normal findings  . TRANSTHORACIC ECHOCARDIOGRAM  February 2014    EF 55%. Mild-moderate MR   Family History  Problem Relation Age of Onset  . Stroke Mother   . Liver cancer Father   . Diabetes Father   . Esophageal cancer Brother   . Cerebral aneurysm Daughter   . Melanoma Daughter   . Cerebral aneurysm Son    Allergies  Allergen Reactions  . Niaspan [Niacin Er] Rash  . Penicillins Rash    Current Outpatient Medications:  .  APPLE CIDER VINEGAR PO, Take by mouth., Disp: ,  Rfl:  .  cholecalciferol (VITAMIN D) 1000 UNITS tablet, Take 1,000 Units by mouth daily., Disp: , Rfl:  .  clopidogrel (PLAVIX) 75 MG tablet, Take 1 tablet (75 mg total) by mouth daily., Disp: 90 tablet, Rfl: 3 .  empagliflozin (JARDIANCE) 10 MG TABS tablet, Take 10 mg by mouth daily., Disp: 21 tablet, Rfl: 0 .  metoprolol succinate (TOPROL-XL) 25 MG 24 hr tablet, Take 0.5 tablets (12.5 mg total) by mouth daily., Disp: 45 tablet, Rfl: 3 .  Multiple Vitamin (MULTIVITAMIN) tablet, Take 1 tablet by mouth daily., Disp: , Rfl:  .  naproxen sodium (ANAPROX) 220 MG tablet, Take 220 mg by mouth as needed., Disp: , Rfl:  .  nitroGLYCERIN (NITROSTAT) 0.4 MG SL tablet, Place 1 tablet (0.4 mg total) under the tongue every 5 (five) minutes as needed for chest pain., Disp: 25 tablet, Rfl: 11 .  ONE TOUCH ULTRA TEST test strip, USE AS DIRECTED ONCE  DAILY, Disp: 100 each, Rfl: 4 .  ONETOUCH DELICA LANCETS FINE MISC, One finger stick daily for fasting blood sugar check., Disp: 100 each, Rfl: 3 .  pantoprazole (PROTONIX) 40 MG tablet, TAKE 1 TABLET EVERY DAY - pt takes Mon, Wed, Fri, Disp: 45 tablet, Rfl: 3 .  ramipril (ALTACE) 5 MG capsule, TAKE ONE CAPSULE BY MOUTH EVERY DAY, Disp: 180 capsule, Rfl: 1 .  rosuvastatin (CRESTOR) 10 MG tablet, Take 1 tablet (10 mg total) by mouth daily., Disp: 90 tablet, Rfl: 3  Review of Systems  Constitutional: Negative.   Respiratory: Negative.   Cardiovascular: Negative.   Endocrine: Positive for polyuria.   Social History   Tobacco Use  . Smoking status: Never Smoker  . Smokeless tobacco: Never Used  Substance Use Topics  . Alcohol use: No    Alcohol/week: 0.0 oz   Objective:   BP 134/74 (BP Location: Right Arm, Patient Position: Sitting, Cuff Size: Normal)   Pulse (!) 56   Temp 98 F (36.7 C) (Oral)   Wt 175 lb 3.2 oz (79.5 kg)   SpO2 99%   BMI 27.44 kg/m  BP Readings from Last 3 Encounters:  04/25/18 134/74  04/04/18 132/70  01/28/18 134/68   Physical Exam  Constitutional: He is oriented to person, place, and time. He appears well-developed and well-nourished. No distress.  HENT:  Head: Normocephalic and atraumatic.  Right Ear: Hearing normal.  Left Ear: Hearing normal.  Nose: Nose normal.  Eyes: Conjunctivae and lids are normal. Right eye exhibits no discharge. Left eye exhibits no discharge. No scleral icterus.  Neck: Neck supple.  Cardiovascular: Normal rate.  Pulmonary/Chest: Effort normal and breath sounds normal. No respiratory distress.  Abdominal: Soft. Bowel sounds are normal.  Musculoskeletal: Normal range of motion.  Neurological: He is alert and oriented to person, place, and time.  Skin: Skin is intact. No lesion and no rash noted.  Psychiatric: He has a normal mood and affect. His speech is normal and behavior is normal. Thought content normal.        Assessment & Plan:     1. Type 2 diabetes mellitus with complication, without long-term current use of insulin (HCC) FBS averaging 140-150 on the Jardiance. Insurance doesn't cover Jardiance - will change to Invokana 100 mg qd. Worried about possible side effects of Metformin and won't take it now. Continue diet and physical activity. Has noticed the increase in urination with this change. Recheck in 3 months. - canagliflozin (INVOKANA) 100 MG TABS tablet; Take 1 tablet (100 mg total) by  mouth daily before breakfast.  Dispense: 90 tablet; Refill: [place], [facility]

## 2018-06-22 ENCOUNTER — Other Ambulatory Visit: Payer: Self-pay | Admitting: Cardiology

## 2018-06-23 NOTE — Telephone Encounter (Signed)
Rx has been sent to the pharmacy electronically. ° °

## 2018-07-16 DIAGNOSIS — E119 Type 2 diabetes mellitus without complications: Secondary | ICD-10-CM | POA: Diagnosis not present

## 2018-07-16 LAB — HM DIABETES EYE EXAM

## 2018-07-18 ENCOUNTER — Encounter: Payer: Self-pay | Admitting: *Deleted

## 2018-07-21 ENCOUNTER — Encounter: Payer: Self-pay | Admitting: Family Medicine

## 2018-07-21 ENCOUNTER — Telehealth: Payer: Self-pay | Admitting: Cardiology

## 2018-07-21 ENCOUNTER — Ambulatory Visit (INDEPENDENT_AMBULATORY_CARE_PROVIDER_SITE_OTHER): Payer: PPO | Admitting: Family Medicine

## 2018-07-21 VITALS — BP 126/72 | HR 58 | Temp 97.6°F | Wt 173.0 lb

## 2018-07-21 DIAGNOSIS — E118 Type 2 diabetes mellitus with unspecified complications: Secondary | ICD-10-CM

## 2018-07-21 DIAGNOSIS — I10 Essential (primary) hypertension: Secondary | ICD-10-CM

## 2018-07-21 DIAGNOSIS — Z9861 Coronary angioplasty status: Secondary | ICD-10-CM | POA: Diagnosis not present

## 2018-07-21 DIAGNOSIS — E785 Hyperlipidemia, unspecified: Secondary | ICD-10-CM

## 2018-07-21 DIAGNOSIS — I2510 Atherosclerotic heart disease of native coronary artery without angina pectoris: Secondary | ICD-10-CM | POA: Diagnosis not present

## 2018-07-21 LAB — POCT GLYCOSYLATED HEMOGLOBIN (HGB A1C): Hemoglobin A1C: 7.2 % — AB (ref 4.0–5.6)

## 2018-07-21 NOTE — Progress Notes (Signed)
Patient: Charles Sellers. Male    DOB: 1932/02/02   84 y.o.   MRN: [id number] Visit Date: 07/21/2018  Today's Provider: [name], PA   Chief Complaint  Patient presents with  . Diabetes  . Hyperlipidemia  . Hypertension   Subjective:    Diabetes  He presents for his follow-up diabetic visit. He has type 2 diabetes mellitus. His disease course has been stable. There are no hypoglycemic associated symptoms. Pertinent negatives for hypoglycemia include no dizziness or headaches. Associated symptoms include polyphagia. Pertinent negatives for diabetes include no blurred vision, no chest pain, no fatigue, no foot paresthesias, no foot ulcerations, no polydipsia, no polyuria, no visual change, no weakness and no weight loss. Symptoms are stable. He is compliant with treatment all of the time. He is following a diabetic and generally healthy diet. There is no change (Pt's blood sugar runs in the low to mid 100's. ) in his home blood glucose trend. An ACE inhibitor/angiotensin II receptor blocker is being taken. He does not see a podiatrist.Eye exam is current.  Hypertension  This is a chronic problem. The problem is unchanged. The problem is controlled. Pertinent negatives include no blurred vision, chest pain or headaches. There are no associated agents to hypertension. There are no compliance problems.   Hyperlipidemia  This is a chronic problem. The problem is controlled. Pertinent negatives include no chest pain. Current antihyperlipidemic treatment includes statins. There are no compliance problems.    BP Readings from Last 3 Encounters:  07/21/18 126/72  04/25/18 134/74  04/04/18 132/70   Lab Results  Component Value Date   HGBA1C 7.6 (H) 04/04/2018   Wt Readings from Last 3 Encounters:  07/21/18 173 lb (78.5 kg)  04/25/18 175 lb 3.2 oz (79.5 kg)  04/04/18 178 lb (80.7 kg)   Lab Results  Component Value Date   CHOL 120 01/02/2018   CHOL 123 06/18/2017   CHOL 108  09/07/2016   Lab Results  Component Value Date   HDL 36 (L) 01/02/2018   HDL 38 (L) 06/18/2017   HDL 37 (L) 09/07/2016   Lab Results  Component Value Date   LDLCALC 71 01/02/2018   LDLCALC 73 06/18/2017   LDLCALC 60 09/07/2016   Lab Results  Component Value Date   TRIG 65 01/02/2018   TRIG 61 06/18/2017   TRIG 56 09/07/2016   Lab Results  Component Value Date   CHOLHDL 3.3 01/02/2018   CHOLHDL 3.2 06/18/2017   CHOLHDL 2.9 09/07/2016      Past Medical History:  Diagnosis Date  . Atherosclerosis   . CAD (coronary artery disease) 2011 & 2009   Echo, EF =>55% 2011/ Echo, EF =>55% 2009  . CAD S/P percutaneous coronary angioplasty 12/13 & 15 2005   PCI - mRCA (Cypher DES 3.5 mm x 18 mm --> 4.67mm); RPL Cypher 2.5 mm x 13 mm;; mLAD Cy a pher DES 3.0 mm x 13 mm (3.5 mm) with Cutting PTCA of D2 ostium;; mid Cx 100% after small OM1.  . Cholecystitis   . Dermatitis   . Diabetes mellitus type II, controlled ([place])    Diet controlled. No medications; he indicates "borderline diabetes"  . Dyslipidemia, goal LDL below 70     on statin  . Hypertension   . Leaky heart valve   . Prostate nodule   . Ventral hernia    Past Surgical History:  Procedure Laterality Date  . APPENDECTOMY    .  CARDIAC CATHETERIZATION  2005  . Carotid Dopplers  September 2014   Bilateral internal carotids < 49%.  . CORONARY ANGIOPLASTY WITH STENT PLACEMENT     mRCA Cypher DES 3.5 mm x 18 mm (4.0), RPL Cypher DES 2.5 mm x 13 mm; mLAD 3.0 mm x 13 mm Cypher -> cutting PTCA of jailed D2; previousl PCI of Cx - 100% mid Cx.  Marland Kitchen NM MYOVIEW LTD  06/2012   Small area of basal inferior infarct with no ischemia. Normal EF  . Stress Myoview  2013   normal findings  . TRANSTHORACIC ECHOCARDIOGRAM  February 2014    EF 55%. Mild-moderate MR   Family History  Problem Relation Age of Onset  . Stroke Mother   . Liver cancer Father   . Diabetes Father   . Esophageal cancer Brother   . Cerebral aneurysm Daughter   .  Melanoma Daughter   . Cerebral aneurysm Son    Allergies  Allergen Reactions  . Niaspan [Niacin Er] Rash  . Penicillins Rash    Current Outpatient Medications:  .  canagliflozin (INVOKANA) 100 MG TABS tablet, Take 1 tablet (100 mg total) by mouth daily before breakfast., Disp: 90 tablet, Rfl: 1 .  cholecalciferol (VITAMIN D) 1000 UNITS tablet, Take 1,000 Units by mouth daily., Disp: , Rfl:  .  clopidogrel (PLAVIX) 75 MG tablet, TAKE 1 TABLET BY MOUTH EVERY DAY, Disp: 30 tablet, Rfl: 0 .  metoprolol succinate (TOPROL-XL) 25 MG 24 hr tablet, TAKE 1/2 TABLET BY MOUTH EVERY DAY, Disp: 15 tablet, Rfl: 0 .  Multiple Vitamin (MULTIVITAMIN) tablet, Take 1 tablet by mouth daily., Disp: , Rfl:  .  naproxen sodium (ANAPROX) 220 MG tablet, Take 220 mg by mouth as needed., Disp: , Rfl:  .  nitroGLYCERIN (NITROSTAT) 0.4 MG SL tablet, Place 1 tablet (0.4 mg total) under the tongue every 5 (five) minutes as needed for chest pain., Disp: 25 tablet, Rfl: 11 .  ONE TOUCH ULTRA TEST test strip, USE AS DIRECTED ONCE DAILY, Disp: 100 each, Rfl: 4 .  ONETOUCH DELICA LANCETS FINE MISC, One finger stick daily for fasting blood sugar check., Disp: 100 each, Rfl: 3 .  pantoprazole (PROTONIX) 40 MG tablet, TAKE 1 TABLET EVERY DAY - pt takes Mon, Wed, Fri, Disp: 45 tablet, Rfl: 3 .  ramipril (ALTACE) 5 MG capsule, TAKE ONE CAPSULE BY MOUTH EVERY DAY, Disp: 180 capsule, Rfl: 1 .  APPLE CIDER VINEGAR PO, Take by mouth., Disp: , Rfl:  .  rosuvastatin (CRESTOR) 10 MG tablet, Take 1 tablet (10 mg total) by mouth daily., Disp: 90 tablet, Rfl: 3  Review of Systems  Constitutional: Negative.  Negative for fatigue and weight loss.  Eyes: Negative for blurred vision.  Respiratory: Negative.   Cardiovascular: Negative.  Negative for chest pain.  Gastrointestinal: Negative.   Endocrine: Positive for polyphagia. Negative for cold intolerance, heat intolerance, polydipsia and polyuria.  Neurological: Negative for dizziness,  weakness, light-headedness and headaches.    Social History   Tobacco Use  . Smoking status: Never Smoker  . Smokeless tobacco: Never Used  Substance Use Topics  . Alcohol use: No    Alcohol/week: 0.0 standard drinks   Objective:   BP 126/72 (BP Location: Left Arm, Patient Position: Sitting, Cuff Size: Normal)   Pulse (!) 58   Temp 97.6 F (36.4 C) (Oral)   Wt 173 lb (78.5 kg)   SpO2 95%   BMI 27.10 kg/m  Vitals:   07/21/18 0826  BP: 126/72  Pulse: (!) 58  Temp: 97.6 F (36.4 C)  TempSrc: Oral  SpO2: 95%  Weight: 173 lb (78.5 kg)   Physical Exam  Constitutional: He is oriented to person, place, and time. He appears well-developed and well-nourished. No distress.  HENT:  Head: Normocephalic and atraumatic.  Right Ear: Hearing normal.  Left Ear: Hearing normal.  Nose: Nose normal.  Eyes: Conjunctivae and lids are normal. Right eye exhibits no discharge. Left eye exhibits no discharge. No scleral icterus.  Neck: Neck supple.  Cardiovascular: Normal rate and regular rhythm.  Pulmonary/Chest: Effort normal and breath sounds normal. No respiratory distress.  Abdominal: Soft. Bowel sounds are normal.  Musculoskeletal: Normal range of motion.  Neurological: He is alert and oriented to person, place, and time.  Skin: Skin is intact. No lesion and no rash noted.  Psychiatric: He has a normal mood and affect. His speech is normal and behavior is normal. Thought content normal.      Assessment & Plan:     1. Type 2 diabetes mellitus with complication, without long-term current use of insulin (HCC) FBS in the 130-140's on average. Occasionally spikes to 160-170. Notices increase in urinary frequency since taking the Invokana 100 mg qd. Hgb A1C 7.2% today (was 7.6% on 04-04-18). Still on Altace and Crestor daily. Recheck labs and follow up pending reports. - CBC with Differential/Platelet - Comprehensive metabolic panel - Lipid panel - POCT glycosylated hemoglobin (Hb  A1C)  2. Dyslipidemia, goal LDL below 70 LDL 71 and HDL 36 in January 2019. Tolerating Crestor 10 mg qd without side effect. Good energy level. Will check lipid panel and CMP. May need to consider Co-Q 10 added to present regimen. Proceed with cardiology follow up as planned in 3-4 weeks. - Comprehensive metabolic panel - Lipid panel  3. Essential hypertension Well controlled BP. Tolerating the Metoprolol Succinate 25 mg 1/2 tablet qd and Altace 5 mg qd without side effects. Will check CBC and CMP. Recheck pending lab reports. - CBC with Differential/Platelet - Comprehensive metabolic panel  4. CAD S/P percutaneous coronary angioplasty Stable without use of NTG in the past 3-4 years. Continues Plavix, Metoprolol, Crestor and is schedule for cardiology recheck (Dr. Ellyn Hack) in 3-4 weeks. Had stent placements in 2013 and 2015.       [name], PA  [facility]

## 2018-07-21 NOTE — Telephone Encounter (Signed)
New Message      *STAT* If patient is at the pharmacy, call can be transferred to refill team.   1. Which medications need to be refilled? (please list name of each medication and dose if known) rosuvastatin (CRESTOR) 10 MG tablet , metoprolol succinate (TOPROL-XL) 25 MG 24 hr tablet, clopidogrel (PLAVIX) 75 MG tablet, ramipril (ALTACE) 5 MG capsule and pantoprazole (PROTONIX) 40 MG tablet 2. Which pharmacy/location (including street and city if local pharmacy) is medication to be sent to? [place]  3. Do they need a 30 day or 90 day supply? [place]

## 2018-07-22 ENCOUNTER — Telehealth: Payer: Self-pay

## 2018-07-22 ENCOUNTER — Other Ambulatory Visit: Payer: Self-pay

## 2018-07-22 ENCOUNTER — Other Ambulatory Visit: Payer: Self-pay | Admitting: Cardiology

## 2018-07-22 LAB — LIPID PANEL
CHOL/HDL RATIO: 3.4 ratio (ref 0.0–5.0)
Cholesterol, Total: 131 mg/dL (ref 100–199)
HDL: 38 mg/dL — ABNORMAL LOW (ref >39)
LDL Calculated: 79 mg/dL (ref 0–99)
TRIGLYCERIDES: 72 mg/dL (ref 0–149)
VLDL Cholesterol Cal: 14 mg/dL (ref 5–40)

## 2018-07-22 LAB — CBC WITH DIFFERENTIAL/PLATELET
BASOS: 1 %
Basophils Absolute: 0 10*3/uL (ref 0.0–0.2)
EOS (ABSOLUTE): 0.2 10*3/uL (ref 0.0–0.4)
EOS: 3 %
HEMATOCRIT: 45.8 % (ref 37.5–51.0)
Hemoglobin: 15.8 g/dL (ref 13.0–17.7)
Immature Grans (Abs): 0 10*3/uL (ref 0.0–0.1)
Immature Granulocytes: 0 %
LYMPHS ABS: 1.5 10*3/uL (ref 0.7–3.1)
Lymphs: 24 %
MCH: 30.3 pg (ref 26.6–33.0)
MCHC: 34.5 g/dL (ref 31.5–35.7)
MCV: 88 fL (ref 79–97)
MONOS ABS: 0.7 10*3/uL (ref 0.1–0.9)
Monocytes: 11 %
NEUTROS ABS: 3.8 10*3/uL (ref 1.4–7.0)
Neutrophils: 61 %
Platelets: 164 10*3/uL (ref 150–450)
RBC: 5.21 x10E6/uL (ref 4.14–5.80)
RDW: 13.2 % (ref 12.3–15.4)
WBC: 6.2 10*3/uL (ref 3.4–10.8)

## 2018-07-22 LAB — COMPREHENSIVE METABOLIC PANEL
A/G RATIO: 1.8 (ref 1.2–2.2)
ALBUMIN: 4.6 g/dL (ref 3.5–4.7)
ALK PHOS: 64 IU/L (ref 39–117)
ALT: 21 IU/L (ref 0–44)
AST: 21 IU/L (ref 0–40)
BUN / CREAT RATIO: 21 (ref 10–24)
BUN: 14 mg/dL (ref 8–27)
Bilirubin Total: 0.9 mg/dL (ref 0.0–1.2)
CALCIUM: 9.5 mg/dL (ref 8.6–10.2)
CO2: 25 mmol/L (ref 20–29)
Chloride: 100 mmol/L (ref 96–106)
Creatinine, Ser: 0.66 mg/dL — ABNORMAL LOW (ref 0.76–1.27)
GFR calc Af Amer: 103 mL/min/{1.73_m2} (ref >59)
GFR, EST NON AFRICAN AMERICAN: 89 mL/min/{1.73_m2} (ref >59)
GLOBULIN, TOTAL: 2.5 g/dL (ref 1.5–4.5)
Glucose: 137 mg/dL — ABNORMAL HIGH (ref 65–99)
POTASSIUM: 4.8 mmol/L (ref 3.5–5.2)
Sodium: 139 mmol/L (ref 134–144)
Total Protein: 7.1 g/dL (ref 6.0–8.5)

## 2018-07-22 MED ORDER — RAMIPRIL 5 MG PO CAPS: 5.0000 mg | ORAL_CAPSULE | Freq: Every day | ORAL | Status: DC

## 2018-07-22 MED ORDER — RAMIPRIL 5 MG PO CAPS: 5.0000 mg | ORAL_CAPSULE | Freq: Every day | ORAL | 0 refills | Status: DC

## 2018-07-22 MED ORDER — CLOPIDOGREL BISULFATE 75 MG PO TABS: 75.0000 mg | ORAL_TABLET | Freq: Every day | ORAL | 0 refills | Status: DC

## 2018-07-22 MED ORDER — PANTOPRAZOLE SODIUM 40 MG PO TBEC: DELAYED_RELEASE_TABLET | ORAL | 0 refills | Status: DC

## 2018-07-22 NOTE — Addendum Note (Signed)
Addended by: Waylan Rocher on: 07/22/2018 07:41 AM   Modules accepted: Orders

## 2018-07-22 NOTE — Telephone Encounter (Signed)
-----   Message from Margo Common, [place] sent at 07/22/2018  9:07 AM EDT ----- Blood sugar pretty good and remainder of labs normal except HDL slightly low. Continue present medications and may add OTC Co-Q 10 100 mg qd. Recheck progress in 3 months.

## 2018-07-22 NOTE — Telephone Encounter (Signed)
Pt advised.   Thanks,   -Laura  

## 2018-07-29 ENCOUNTER — Other Ambulatory Visit (HOSPITAL_COMMUNITY): Payer: PPO

## 2018-08-07 ENCOUNTER — Ambulatory Visit (INDEPENDENT_AMBULATORY_CARE_PROVIDER_SITE_OTHER): Payer: PPO

## 2018-08-07 ENCOUNTER — Other Ambulatory Visit: Payer: Self-pay

## 2018-08-07 DIAGNOSIS — I350 Nonrheumatic aortic (valve) stenosis: Secondary | ICD-10-CM | POA: Diagnosis not present

## 2018-08-07 DIAGNOSIS — I340 Nonrheumatic mitral (valve) insufficiency: Secondary | ICD-10-CM | POA: Diagnosis not present

## 2018-08-08 MED ORDER — ROSUVASTATIN CALCIUM 10 MG PO TABS: 10.0000 mg | ORAL_TABLET | Freq: Every day | ORAL | 3 refills | Status: DC

## 2018-08-08 NOTE — Telephone Encounter (Signed)
PATIENT IS TAKING 10 MG ROSUVASTATIN DAILY SINCE 2018. REFILLED MEDICATIONS

## 2018-08-15 ENCOUNTER — Other Ambulatory Visit: Payer: Self-pay | Admitting: Cardiology

## 2018-08-21 ENCOUNTER — Other Ambulatory Visit: Payer: Self-pay | Admitting: Cardiology

## 2018-08-26 ENCOUNTER — Other Ambulatory Visit: Payer: Self-pay | Admitting: Cardiology

## 2018-09-04 ENCOUNTER — Telehealth: Payer: Self-pay

## 2018-09-04 NOTE — Telephone Encounter (Signed)
Tried to contact pt to schedule his AWV for 11/19. NANM. Will try back at a later time.  -MM

## 2018-09-05 NOTE — Telephone Encounter (Signed)
Scheduled AWV for 11/15 @ 920 AM. -MM

## 2018-09-15 ENCOUNTER — Ambulatory Visit: Payer: PPO | Admitting: Cardiology

## 2018-09-15 ENCOUNTER — Encounter: Payer: Self-pay | Admitting: Cardiology

## 2018-09-15 VITALS — BP 130/66 | HR 59 | Ht 67.0 in | Wt 175.0 lb

## 2018-09-15 DIAGNOSIS — I340 Nonrheumatic mitral (valve) insufficiency: Secondary | ICD-10-CM

## 2018-09-15 DIAGNOSIS — E785 Hyperlipidemia, unspecified: Secondary | ICD-10-CM

## 2018-09-15 DIAGNOSIS — I350 Nonrheumatic aortic (valve) stenosis: Secondary | ICD-10-CM | POA: Diagnosis not present

## 2018-09-15 DIAGNOSIS — I10 Essential (primary) hypertension: Secondary | ICD-10-CM | POA: Diagnosis not present

## 2018-09-15 DIAGNOSIS — I2510 Atherosclerotic heart disease of native coronary artery without angina pectoris: Secondary | ICD-10-CM | POA: Diagnosis not present

## 2018-09-15 DIAGNOSIS — Z9861 Coronary angioplasty status: Secondary | ICD-10-CM

## 2018-09-15 MED ORDER — CLOPIDOGREL BISULFATE 75 MG PO TABS: 75.0000 mg | ORAL_TABLET | Freq: Every day | ORAL | 3 refills | Status: DC

## 2018-09-15 MED ORDER — RAMIPRIL 5 MG PO CAPS: 5.0000 mg | ORAL_CAPSULE | Freq: Every day | ORAL | 3 refills | Status: DC

## 2018-09-15 MED ORDER — METOPROLOL SUCCINATE ER 25 MG PO TB24: 12.5000 mg | ORAL_TABLET | Freq: Every day | ORAL | 3 refills | Status: DC

## 2018-09-15 MED ORDER — ROSUVASTATIN CALCIUM 10 MG PO TABS: 10.0000 mg | ORAL_TABLET | Freq: Every day | ORAL | 3 refills | Status: DC

## 2018-09-15 MED ORDER — PANTOPRAZOLE SODIUM 40 MG PO TBEC: DELAYED_RELEASE_TABLET | ORAL | 3 refills | Status: DC

## 2018-09-15 MED ORDER — NITROGLYCERIN 0.4 MG SL SUBL: 0.4000 mg | SUBLINGUAL_TABLET | SUBLINGUAL | 3 refills | Status: DC | PRN

## 2018-09-15 NOTE — Patient Instructions (Signed)
Medication Instructions:  NO CHANGES If you need a refill on your cardiac medications before your next appointment, please call your pharmacy.   Lab work: NOT NEEDED-- HAVE PRIMARY SEND A COPY OF LABS If you have labs (blood work) drawn today and your tests are completely normal, you will receive your results only by: Marland Kitchen MyChart Message (if you have MyChart) OR . A paper copy in the mail If you have any lab test that is abnormal or we need to change your treatment, we will call you to review the results.  Testing/Procedures: NOT NEEDED  Follow-Up: At [facility], you and your health needs are our priority.  As part of our continuing mission to provide you with exceptional heart care, we have created designated Provider Care Teams.  These Care Teams include your primary Cardiologist (physician) and Advanced Practice Providers (APPs -  Physician Assistants and Nurse Practitioners) who all work together to provide you with the care you need, when you need it.  You will need a follow up appointment in 12 months OCT 2020.  Please call our office 2 months in advance to schedule this appointment.  You may see Glenetta Hew, MD or one of the following Advanced Practice Providers on your designated Care Team:   Rosaria Ferries, PA-C . Jory Sims, DNP, ANP  Any Other Special Instructions Will Be Listed Below (If Applicable).

## 2018-09-15 NOTE — Progress Notes (Signed)
PCP: Margo Common, PA  Clinic Note: Chief Complaint  Patient presents with  . Follow-up    No complaints  . Coronary Artery Disease    No angina  . Mitral Regurgitation    Moderate by echo.  No dyspnea    HPI: Charles Sellers. Charles Sellers) is a 84 y.o. male with a PMH - CAD-PCI who presents today for delayed annual f/u.  Former patient of Dr. Rollene Fare --> had staged two-vessel PCI in December 2005 --> initial PCI of the mid RCA with a 3.5 x 18 mm Cypher DES postdilated to 4.0 mm and posterior lateral branch with a Cypher DES 2.5 mm x 13 mm --> stage PCI of the LAD at D2 with a Cypher DES 3.0 mm 30 mm postdilated to 3.20mm with provisional PTCA of ostial D2 plaque shift -reduced to 20%.  He had CTO of the distal circumflex -> treated medically. -->  Follow-up Myoview evaluations were negative with the most recent one being in July 2013.  Revis Whalin. was last seen on August 2018.  He was doing well at that time with no major complaints.  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  No cardiac symptoms.  Recent Hospitalizations: None  Studies Personally Reviewed - (if available, images/films reviewed: From Epic Chart or Care Everywhere)  TTE 08/07/2018: Normal LV function EF 55 to 60%.  No R WMA.  GR 1 DD.  Mild aortic stenosis with trivial regurgitation.  Moderate mitral regurgitation with mildly dilated left atrium.  Interval History: Charles Sellers presents today again his usual self.  He is in good spirits as usual.  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  Most recently he has been doing firewood collecting.  He will do about 2 full tanks full of gas in the chainsaw before stopping.  He will then switch to the low log splitter.  He will work several hours a day and his wife is very frustrated that he may overdo it.  He knows that if he pushes it to be on a certain point he may get a bit tired and fatigued and want to go sit down.  But  he will go right back out again once he is recovers.  He has not had any recurrent anginal symptoms since his PCI. He has not had any heart failure symptoms.  No PND, orthopnea or edema. No rapid irregular heartbeats palpitations.  No syncope/near syncope or TIA/amorous fugax. He remains on Plavix since he has not really tolerated aspirin well.  ROS: A comprehensive was performed. Review of Systems  Constitutional: Negative for malaise/fatigue and weight loss.  HENT: Negative for congestion and nosebleeds.   Respiratory: Negative for cough, shortness of breath and wheezing.   Gastrointestinal: Negative for blood in stool, heartburn and melena.  Genitourinary: Negative for hematuria.  Musculoskeletal: Positive for joint pain ("Normal aches and pains ").  Neurological: Negative for dizziness, focal weakness and weakness.  Psychiatric/Behavioral: Negative for memory loss.  All other systems reviewed and are negative.   I have reviewed and (if needed) personally updated the patient's problem list, medications, allergies, past medical and surgical history, social and family history.   Past Medical History:  Diagnosis Date  . Atherosclerosis   . CAD (coronary artery disease) 2011 & 2009   Echo, EF =>55% 2011/ Echo, EF =>55% 2009  . CAD S/P percutaneous coronary angioplasty 12/13 & 15 2005   PCI -  mRCA (Cypher DES 3.5 mm x 18 mm --> 4.67mm); RPL Cypher 2.5 mm x 13 mm;; mLAD Cy a pher DES 3.0 mm x 13 mm (3.5 mm) with Cutting PTCA of D2 ostium;; mid Cx 100% after small OM1.  . Cholecystitis   . Dermatitis   . Diabetes mellitus type II, controlled ([place])    Diet controlled. No medications; he indicates "borderline diabetes"  . Dyslipidemia, goal LDL below 70     on statin  . Hypertension   . Leaky heart valve   . Prostate nodule   . Ventral hernia     Past Surgical History:  Procedure Laterality Date  . APPENDECTOMY    . CARDIAC CATHETERIZATION  2005  . Carotid Dopplers  September 2014    Bilateral internal carotids < 49%.  . CORONARY ANGIOPLASTY WITH STENT PLACEMENT     mRCA Cypher DES 3.5 mm x 18 mm (4.0), RPL Cypher DES 2.5 mm x 13 mm; mLAD 3.0 mm x 13 mm Cypher -> cutting PTCA of jailed D2; previousl PCI of Cx - 100% mid Cx.  Marland Kitchen NM MYOVIEW LTD  06/2012   Small area of basal inferior infarct with no ischemia. Normal EF  . Stress Myoview  2013   normal findings  . TRANSTHORACIC ECHOCARDIOGRAM  February 2014    EF 55%. Mild-moderate MR    Current Meds  Medication Sig  . APPLE CIDER VINEGAR PO Take by mouth.  . canagliflozin (INVOKANA) 100 MG TABS tablet Take 1 tablet (100 mg total) by mouth daily before breakfast.  . cholecalciferol (VITAMIN D) 1000 UNITS tablet Take 1,000 Units by mouth daily.  . clopidogrel (PLAVIX) 75 MG tablet Take 1 tablet (75 mg total) by mouth daily.  . metoprolol succinate (TOPROL-XL) 25 MG 24 hr tablet Take 0.5 tablets (12.5 mg total) by mouth daily.  . Multiple Vitamin (MULTIVITAMIN) tablet Take 1 tablet by mouth daily.  . Multiple Vitamins-Minerals (ICAPS AREDS 2 PO) Take 1 capsule by mouth daily.  . naproxen sodium (ANAPROX) 220 MG tablet Take 220 mg by mouth as needed.  . nitroGLYCERIN (NITROSTAT) 0.4 MG SL tablet Place 1 tablet (0.4 mg total) under the tongue every 5 (five) minutes as needed for chest pain.  . ONE TOUCH ULTRA TEST test strip USE AS DIRECTED ONCE DAILY  . ONETOUCH DELICA LANCETS FINE MISC One finger stick daily for fasting blood sugar check.  . pantoprazole (PROTONIX) 40 MG tablet TAKE ONE (1) TABLET BY MOUTH DAILY ON MONDAY, WEDNESDAY, & FRIDAY OF EACH WEEK  . ramipril (ALTACE) 5 MG capsule Take 1 capsule (5 mg total) by mouth daily.  . rosuvastatin (CRESTOR) 10 MG tablet Take 1 tablet (10 mg total) by mouth daily.  . [DISCONTINUED] clopidogrel (PLAVIX) 75 MG tablet Take 1 tablet (75 mg total) by mouth daily. Schedule OV  . [DISCONTINUED] metoprolol succinate (TOPROL-XL) 25 MG 24 hr tablet TAKE 1/2 TABLET BY MOUTH EVERY DAY   . [DISCONTINUED] nitroGLYCERIN (NITROSTAT) 0.4 MG SL tablet Place 1 tablet (0.4 mg total) under the tongue every 5 (five) minutes as needed for chest pain.  . [DISCONTINUED] pantoprazole (PROTONIX) 40 MG tablet TAKE ONE (1) TABLET BY MOUTH DAILY ON MONDAY, WEDNESDAY, & FRIDAY OF EACH WEEK  . [DISCONTINUED] ramipril (ALTACE) 5 MG capsule Take 1 capsule (5 mg total) by mouth daily. KEEP OV.  . [DISCONTINUED] rosuvastatin (CRESTOR) 10 MG tablet Take 1 tablet (10 mg total) by mouth daily.    Allergies  Allergen Reactions  . Niaspan Durene Cal  Er] Rash  . Penicillins Rash    Social History   Tobacco Use  . Smoking status: Never Smoker  . Smokeless tobacco: Never Used  Substance Use Topics  . Alcohol use: No    Alcohol/week: 0.0 standard drinks  . Drug use: No   Social History   Social History Narrative   Married father of 3, 9 grandchildren, now 60 great-grandchildren. 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   Using the toilet, but does not do routine exercise. Always on the go.   Never smoked. Does not drink alcohol.    family history includes Cerebral aneurysm in his daughter and son; Diabetes in his father; Esophageal cancer in his brother; Liver cancer in his father; Melanoma in his daughter; Stroke in his mother.  Wt Readings from Last 3 Encounters:  09/15/18 175 lb (79.4 kg)  07/21/18 173 lb (78.5 kg)  04/25/18 175 lb 3.2 oz (79.5 kg)    PHYSICAL EXAM BP 130/66   Pulse (!) 59   Ht 5\' 7"  (1.702 m)   Wt 175 lb (79.4 kg)   BMI 27.41 kg/m  Physical Exam  Constitutional: He is oriented to person, place, and time. He appears well-developed and well-nourished. No distress.  HENT:  Head: Normocephalic and atraumatic.  Neck: Normal range of motion. Neck supple. No JVD present.  Cardiovascular: Normal rate, regular rhythm and intact distal pulses.  No  extrasystoles are present. PMI is not displaced. Exam reveals no gallop and no friction rub.  Murmur heard.  Medium-pitched harsh crescendo-decrescendo early systolic murmur is present with a grade of 1/6 at the upper right sternal border.  Low-pitched blowing plateau holosystolic murmur of grade 1/6 is also present at the lower left sternal border and apex. Pulmonary/Chest: Effort normal and breath sounds normal. No respiratory distress. He has no wheezes. He has no rales.  Abdominal: Soft. Bowel sounds are normal. He exhibits no distension. There is no tenderness.  Musculoskeletal: Normal range of motion. He exhibits no edema.  Neurological: He is alert and oriented to person, place, and time.  Psychiatric: He has a normal mood and affect. His behavior is normal. Judgment and thought content normal.  Vitals reviewed.   Adult ECG Report  Rate: 59;  Rhythm: normal sinus rhythm, premature atrial contractions (PAC) and RBBB.  Left axis deviation (-40).  Inferior MI, age undetermined.;   Narrative Interpretation: Stable EKG.  No change   Other studies Reviewed: Additional studies/ records that were reviewed today include:  Recent Labs:  Lab Results  Component Value Date   CHOL 131 07/21/2018   CHOL 120 01/02/2018   CHOL 123 06/18/2017   Lab Results  Component Value Date   HDL 38 (L) 07/21/2018   HDL 36 (L) 01/02/2018   HDL 38 (L) 06/18/2017   Lab Results  Component Value Date   LDLCALC 79 07/21/2018   LDLCALC 71 01/02/2018   LDLCALC 73 06/18/2017   Lab Results  Component Value Date   TRIG 72 07/21/2018   TRIG 65 01/02/2018   TRIG 61 06/18/2017   Lab Results  Component Value Date   CHOLHDL 3.4 07/21/2018   CHOLHDL 3.3 01/02/2018   CHOLHDL 3.2 06/18/2017   No results found for: LDLDIRECT   ASSESSMENT / PLAN: Problem List Items Addressed This Visit    CAD S/P percutaneous coronary angioplasty (Chronic)    Relatively distant  history of several vessel PCI back in 2015.   No recurrent anginal symptoms.  Tolerates Plavix better than aspirin, therefore will maintain on Plavix (okay to stop for procedures. On low-dose Toprol and ACE inhibitor.  He is also on moderate dose Crestor which I plan to increase to 20 mg to try to drive his LDL below 80.      Relevant Medications   metoprolol succinate (TOPROL-XL) 25 MG 24 hr tablet   nitroGLYCERIN (NITROSTAT) 0.4 MG SL tablet   ramipril (ALTACE) 5 MG capsule   rosuvastatin (CRESTOR) 10 MG tablet   Other Relevant Orders   EKG 12-Lead (Completed)   Dyslipidemia, goal LDL below 70 - Primary (Chronic)    LDL is gone up from 68 then 73 and now 79.  I am concerned that is going the wrong way. He seems to be doing the same diet, and is quite active.  Plan: Increase Crestor to 20 mg daily.  PCP will continue to follow labs.      Relevant Medications   metoprolol succinate (TOPROL-XL) 25 MG 24 hr tablet   nitroGLYCERIN (NITROSTAT) 0.4 MG SL tablet   ramipril (ALTACE) 5 MG capsule   rosuvastatin (CRESTOR) 10 MG tablet   Essential hypertension (Chronic)    Well-controlled blood pressure without any episodes of hypotension on very low-dose Toprol and moderate dose ramipril.      Relevant Medications   metoprolol succinate (TOPROL-XL) 25 MG 24 hr tablet   nitroGLYCERIN (NITROSTAT) 0.4 MG SL tablet   ramipril (ALTACE) 5 MG capsule   rosuvastatin (CRESTOR) 10 MG tablet   Other Relevant Orders   EKG 12-Lead (Completed)   Mild aortic stenosis (Chronic)    Minimal change from last time.  Probably not not progressed to a significant amount.  Can follow along with mitral regurgitation.      Relevant Medications   metoprolol succinate (TOPROL-XL) 25 MG 24 hr tablet   nitroGLYCERIN (NITROSTAT) 0.4 MG SL tablet   ramipril (ALTACE) 5 MG capsule   rosuvastatin (CRESTOR) 10 MG tablet   Other Relevant Orders   EKG 12-Lead (Completed)   Mitral regurgitation (Chronic)    Asymptomatic with moderate mitral regurgitation.  Murmur  does not sound all that impressive.  I suspect there is some component of ischemic MR. Since he remains totally asymptomatic, I think we can probably hold off on checking echocardiogram until I see him back in a year and can reassess murmur.      Relevant Medications   metoprolol succinate (TOPROL-XL) 25 MG 24 hr tablet   nitroGLYCERIN (NITROSTAT) 0.4 MG SL tablet   ramipril (ALTACE) 5 MG capsule   rosuvastatin (CRESTOR) 10 MG tablet      Current medicines are reviewed at length with the patient today.  (+/- concerns) none The following changes have been made:  None  Patient Instructions  Medication Instructions:  NO CHANGES If you need a refill on your cardiac medications before your next appointment, please call your pharmacy.   Lab work: NOT NEEDED-- HAVE PRIMARY SEND A COPY OF LABS If you have labs (blood work) drawn today and your tests are completely normal, you will receive your results only by: Marland Kitchen MyChart Message (if you have MyChart) OR . A paper copy in the mail If you have any lab test that is abnormal or we need to change your treatment, we will call you to review the results.  Testing/Procedures: NOT NEEDED  Follow-Up: At [facility], you and your health needs are  our priority.  As part of our continuing mission to provide you with exceptional heart care, we have created designated Provider Care Teams.  These Care Teams include your primary Cardiologist (physician) and Advanced Practice Providers (APPs -  Physician Assistants and Nurse Practitioners) who all work together to provide you with the care you need, when you need it.  You will need a follow up appointment in 12 months OCT 2020.  Please call our office 2 months in advance to schedule this appointment.  You may see Glenetta Hew, MD or one of the following Advanced Practice Providers on your designated Care Team:   Rosaria Ferries, PA-C . Jory Sims, DNP, ANP  Any Other Special Instructions Will Be  Listed Below (If Applicable).      Studies Ordered:   Orders Placed This Encounter  Procedures  . EKG 12-Lead      Glenetta Hew, M.D., M.S. Interventional Cardiologist   Pager # [phone] Phone # [phone] [address] [place], [place] [address]   Thank you for choosing Heartcare at [facility]!!

## 2018-09-16 ENCOUNTER — Encounter: Payer: Self-pay | Admitting: Cardiology

## 2018-09-16 NOTE — Assessment & Plan Note (Signed)
Asymptomatic with moderate mitral regurgitation.  Murmur does not sound all that impressive.  I suspect there is some component of ischemic MR. Since he remains totally asymptomatic, I think we can probably hold off on checking echocardiogram until I see him back in a year and can reassess murmur.

## 2018-09-16 NOTE — Assessment & Plan Note (Signed)
LDL is gone up from 68 then 73 and now 79.  I am concerned that is going the wrong way. He seems to be doing the same diet, and is quite active.  Plan: Increase Crestor to 20 mg daily.  PCP will continue to follow labs.

## 2018-09-16 NOTE — Assessment & Plan Note (Signed)
Well-controlled blood pressure without any episodes of hypotension on very low-dose Toprol and moderate dose ramipril.

## 2018-09-16 NOTE — Assessment & Plan Note (Signed)
Relatively distant history of several vessel PCI back in 2015.  No recurrent anginal symptoms.  Tolerates Plavix better than aspirin, therefore will maintain on Plavix (okay to stop for procedures. On low-dose Toprol and ACE inhibitor.  He is also on moderate dose Crestor which I plan to increase to 20 mg to try to drive his LDL below 80.

## 2018-09-16 NOTE — Assessment & Plan Note (Signed)
Minimal change from last time.  Probably not not progressed to a significant amount.  Can follow along with mitral regurgitation.

## 2018-09-20 ENCOUNTER — Other Ambulatory Visit: Payer: Self-pay | Admitting: Cardiology

## 2018-10-23 ENCOUNTER — Other Ambulatory Visit: Payer: Self-pay | Admitting: Family Medicine

## 2018-10-23 DIAGNOSIS — E118 Type 2 diabetes mellitus with unspecified complications: Secondary | ICD-10-CM

## 2018-10-24 ENCOUNTER — Ambulatory Visit (INDEPENDENT_AMBULATORY_CARE_PROVIDER_SITE_OTHER): Payer: PPO

## 2018-10-24 VITALS — BP 124/60 | HR 66 | Temp 98.0°F | Ht 67.0 in | Wt 177.2 lb

## 2018-10-24 DIAGNOSIS — Z23 Encounter for immunization: Secondary | ICD-10-CM | POA: Diagnosis not present

## 2018-10-24 DIAGNOSIS — Z0000 Encounter for general adult medical examination without abnormal findings: Secondary | ICD-10-CM

## 2018-10-24 NOTE — Progress Notes (Addendum)
Subjective:   Charles Sellers. is a 84 y.o. male who presents for Medicare Annual/Subsequent preventive examination.  Review of Systems:  N/A  Cardiac Risk Factors include: advanced age (>42men, >45 women);diabetes mellitus;dyslipidemia;hypertension;male gender     Objective:    Vitals: BP 124/60 (BP Location: Right Arm)   Pulse 66   Temp 98 F (36.7 C) (Oral)   Ht 5\' 7"  (1.702 m)   Wt 177 lb 3.2 oz (80.4 kg)   BMI 27.75 kg/m   Body mass index is 27.75 kg/m.  Advanced Directives 10/24/2018 10/18/2017 08/03/2015 07/26/2015  Does Patient Have a Medical Advance Directive? Yes Yes Yes Yes  Type of Paramedic of [place];Living will [place];Living will Living will;Healthcare Power of Attorney Living will  Does patient want to make changes to medical advance directive? - - No - Patient declined -  Copy of [place] in Chart? No - copy requested No - copy requested - -    Tobacco Social History   Tobacco Use  Smoking Status Never Smoker  Smokeless Tobacco Never Used     Counseling given: Not Answered   Clinical Intake:  Pre-visit preparation completed: Yes  Pain : No/denies pain Pain Score: 0-No pain    Diabetes:  Is the patient diabetic?  Yes  If diabetic, was a CBG obtained today?  No  Did the patient bring in their glucometer from home?  No  How often do you monitor your CBG's? Daily in AM.   Financial Strains and Diabetes Management:  Are you having any financial strains with the device, your supplies or your medication? No .  Does the patient want to be seen by Chronic Care Management for management of their diabetes?  No  Would the patient like to be referred to a Nutritionist or for Diabetic Management?  No   Diabetic Exams:  Diabetic Eye Exam: Completed 07/16/18.    Diabetic Foot Exam: Completed 01/02/18.    How often do you need to have someone help you when you read instructions,  pamphlets, or other written materials from your doctor or pharmacy?: 1 - Never  Interpreter Needed?: No  Information entered by :: [facility], LPN  Past Medical History:  Diagnosis Date  . Atherosclerosis   . CAD (coronary artery disease) 2011 & 2009   Echo, EF =>55% 2011/ Echo, EF =>55% 2009  . CAD S/P percutaneous coronary angioplasty 12/13 & 15 2005   PCI - mRCA (Cypher DES 3.5 mm x 18 mm --> 4.30mm); RPL Cypher 2.5 mm x 13 mm;; mLAD Cy a pher DES 3.0 mm x 13 mm (3.5 mm) with Cutting PTCA of D2 ostium;; mid Cx 100% after small OM1.  . Cholecystitis   . Dermatitis   . Diabetes mellitus type II, controlled ([place])    Diet controlled. No medications; he indicates "borderline diabetes"  . Dyslipidemia, goal LDL below 70     on statin  . Hypertension   . Leaky heart valve   . Prostate nodule   . Ventral hernia    Past Surgical History:  Procedure Laterality Date  . APPENDECTOMY    . CARDIAC CATHETERIZATION  2005  . Carotid Dopplers  September 2014   Bilateral internal carotids < 49%.  . CORONARY ANGIOPLASTY WITH STENT PLACEMENT     mRCA Cypher DES 3.5 mm x 18 mm (4.0), RPL Cypher DES 2.5 mm x 13 mm; mLAD 3.0 mm x 13 mm Cypher -> cutting PTCA  of jailed D2; previousl PCI of Cx - 100% mid Cx.  Marland Kitchen NM MYOVIEW LTD  06/2012   Small area of basal inferior infarct with no ischemia. Normal EF  . Stress Myoview  2013   normal findings  . TRANSTHORACIC ECHOCARDIOGRAM  February 2014    EF 55%. Mild-moderate MR   Family History  Problem Relation Age of Onset  . Stroke Mother   . Liver cancer Father   . Diabetes Father   . Esophageal cancer Brother   . Cerebral aneurysm Daughter   . Melanoma Daughter   . Cerebral aneurysm Son    Social History   Socioeconomic History  . Marital status: Married    Spouse name: Not on file  . Number of children: 4  . Years of education: Not on file  . Highest education level: High school graduate  Occupational History  . Occupation: retired     Comment: previously worked at [employer]  . Financial resource strain: Not hard at all  . Food insecurity:    Worry: Never true    Inability: Never true  . Transportation needs:    Medical: No    Non-medical: No  Tobacco Use  . Smoking status: Never Smoker  . Smokeless tobacco: Never Used  Substance and Sexual Activity  . Alcohol use: No    Alcohol/week: 0.0 standard drinks  . Drug use: No  . Sexual activity: Not on file  Lifestyle  . Physical activity:    Days per week: 0 days    Minutes per session: 0 min  . Stress: Not at all  Relationships  . Social connections:    Talks on phone: Patient refused    Gets together: Patient refused    Attends religious service: Patient refused    Active member of club or organization: Patient refused    Attends meetings of clubs or organizations: Patient refused    Relationship status: Patient refused  Other Topics Concern  . Not on file  Social History Narrative   Married father of 3, 9 grandchildren, now 48 great-grandchildren. 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   Using the toilet, but does not do routine exercise. Always on the go.   Never smoked. Does not drink alcohol.    Outpatient Encounter Medications as of 10/24/2018  Medication Sig  . cholecalciferol (VITAMIN D) 1000 UNITS tablet Take 1,000 Units by mouth daily.  . clopidogrel (PLAVIX) 75 MG tablet Take 1 tablet (75 mg total) by mouth daily.  . INVOKANA 100 MG TABS tablet TAKE 1 TABLET BY MOUTH DAILY BEFORE BREAKFAST  . metoprolol succinate (TOPROL-XL) 25 MG 24 hr tablet Take 0.5 tablets (12.5 mg total) by mouth daily.  . Multiple Vitamin (MULTIVITAMIN) tablet Take 1 tablet by mouth daily.  . Multiple Vitamins-Minerals (ICAPS AREDS 2 PO) Take 1 capsule by mouth daily.  . naproxen sodium (ANAPROX) 220 MG tablet Take 220 mg  by mouth daily as needed.   . nitroGLYCERIN (NITROSTAT) 0.4 MG SL tablet Place 1 tablet (0.4 mg total) under the tongue every 5 (five) minutes as needed for chest pain.  . Omega-3 Fatty Acids (FISH OIL) 1000 MG CAPS Take 1,000 mg by mouth daily.  . ONE TOUCH ULTRA TEST test strip USE AS DIRECTED ONCE DAILY  . ONETOUCH DELICA LANCETS FINE MISC One finger stick daily for fasting  blood sugar check.  . pantoprazole (PROTONIX) 40 MG tablet TAKE ONE (1) TABLET BY MOUTH DAILY ON MONDAY, WEDNESDAY, & FRIDAY OF EACH WEEK  . ramipril (ALTACE) 5 MG capsule TAKE 1 CAPSULE BY MOUTH EVERY DAY  . rosuvastatin (CRESTOR) 10 MG tablet Take 1 tablet (10 mg total) by mouth daily.  . APPLE CIDER VINEGAR PO Take by mouth.   No facility-administered encounter medications on file as of 10/24/2018.     Activities of Daily Living In your present state of health, do you have any difficulty performing the following activities: 10/24/2018  Hearing? N  Vision? N  Comment Wears eye glasses.  Difficulty concentrating or making decisions? N  Walking or climbing stairs? N  Dressing or bathing? N  Doing errands, shopping? N  Preparing Food and eating ? N  Using the Toilet? N  In the past six months, have you accidently leaked urine? N  Do you have problems with loss of bowel control? N  Managing your Medications? N  Managing your Finances? N  Housekeeping or managing your Housekeeping? N  Some recent data might be hidden    Patient Care Team: Chrismon, Vickki Muff, PA as PCP - General (Physician Assistant) Leonie Man, MD as PCP - Cardiology (Cardiology) Birder Robson, MD as Referring Physician (Ophthalmology) Ripley Fraise., MD (General Practice)   Assessment:   This is a routine wellness examination for Charles Sellers.  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 DIET - INCREASE LEAN PROTEINS     Recommend to cut out the fried fatty meats and start eating more baked or broiled chicken instead.     . Increase water intake     Recommend increasing water intake to 6-8 glasses a day.        Fall Risk Fall Risk  10/24/2018 10/18/2017 06/18/2017 01/09/2016 08/31/2015  Falls in the past year? 0 No No No No   FALL RISK PREVENTION PERTAINING TO THE HOME:  Any stairs in or around the home WITH handrails? Yes  Home free of loose throw rugs in walkways, pet beds, electrical cords, etc? Yes  Adequate lighting in your home to reduce risk of falls? Yes   ASSISTIVE DEVICES UTILIZED TO PREVENT FALLS:  Life alert? No  Use of a cane, walker or w/c? No  Grab bars in the bathroom? Yes  Shower chair or bench in shower? Yes  Elevated toilet seat or a handicapped toilet? Yes    TIMED UP AND GO:  Was the test performed? No .    Depression Screen PHQ 2/9 Scores 10/24/2018 10/18/2017 06/18/2017 01/09/2016  PHQ - 2 Score 3 0 0 0  PHQ- 9 Score 3 - 0 -    Cognitive Function: Declined today.        Immunization History  Administered Date(s) Administered  . Influenza, High Dose Seasonal PF 10/18/2017  . Pneumococcal Conjugate-13 01/09/2016  . Pneumococcal Polysaccharide-23 08/04/2008  . Tdap 01/09/2016    Qualifies for Shingles Vaccine? Yes . Due for Shingrix. Education has been provided regarding the importance of this vaccine. Pt has been advised to call insurance company to determine out of pocket expense. Advised may also receive vaccine at local pharmacy or Health Dept. Verbalized acceptance and understanding.  Tdap: Up to date  Flu Vaccine: Due for Flu vaccine. Does the patient  want to receive this vaccine today?  Yes .   Pneumococcal Vaccine: Up to date   Screening Tests Health Maintenance  Topic Date Due  . FOOT EXAM  01/02/2019  . HEMOGLOBIN A1C  01/21/2019  . OPHTHALMOLOGY EXAM  07/17/2019  .  TETANUS/TDAP  01/08/2026  . INFLUENZA VACCINE  Completed  . PNA vac Low Risk Adult  Completed   Cancer Screenings:  Colorectal Screening: No longer required.   Lung Cancer Screening: (Low Dose CT Chest recommended if Age 84-80 years, 30 pack-year currently smoking OR have quit w/in 15years.) does not qualify.    Additional Screening:  Vision Screening: Recommended annual ophthalmology exams for early detection of glaucoma and other disorders of the eye.  Dental Screening: Recommended annual dental exams for proper oral hygiene  Community Resource Referral:  CRR required this visit?  No        Plan:  I have personally reviewed and addressed the Medicare Annual Wellness questionnaire and have noted the following in the patient's chart:  A. Medical and social history B. Use of alcohol, tobacco or illicit drugs  C. Current medications and supplements D. Functional ability and status E.  Nutritional status F.  Physical activity G. Advance directives H. List of other physicians I.  Hospitalizations, surgeries, and ER visits in previous 12 months J.  [place] such as hearing and vision if needed, cognitive and depression L. Referrals and appointments - none  In addition, I have reviewed and discussed with patient certain preventive protocols, quality metrics, and best practice recommendations. A written personalized care plan for preventive services as well as general preventive health recommendations were provided to patient.  See attached scanned questionnaire for additional information.   Signed,  Fabio Neighbors, LPN Nurse Health Advisor   Nurse Recommendations: None.  Reviewed Nurse Health Advisor note and plan. Was available for consultation during the screening. Agree with documentation and recommendations.

## 2018-10-24 NOTE — Patient Instructions (Signed)
[name] , Thank you for taking time to come for your Medicare Wellness Visit. I appreciate your ongoing commitment to your health goals. Please review the following plan we discussed and let me know if I can assist you in the future.   Screening recommendations/referrals: Colonoscopy: No longer required.  Recommended yearly ophthalmology/optometry visit for glaucoma screening and checkup Recommended yearly dental visit for hygiene and checkup  Vaccinations: Influenza vaccine: Administered today. Pneumococcal vaccine: Completed series Tdap vaccine: Up to date, due 12/2025 Shingles vaccine: Pt declines today.     Advanced directives: Please bring a copy of your POA (Power of Attorney) and/or Living Will to your next appointment.   Conditions/risks identified: Recommend to cut out the fried fatty meats and start eating more baked or broiled chicken instead.   Next appointment: 11/10/18 @ 10:40 AM with Charles Sellers.   Preventive Care 82 Years and Older, Male Preventive care refers to lifestyle choices and visits with your health care provider that can promote health and wellness. What does preventive care include?  A yearly physical exam. This is also called an annual well check.  Dental exams once or twice a year.  Routine eye exams. Ask your health care provider how often you should have your eyes checked.  Personal lifestyle choices, including:  Daily care of your teeth and gums.  Regular physical activity.  Eating a healthy diet.  Avoiding tobacco and drug use.  Limiting alcohol use.  Practicing safe sex.  Taking low doses of aspirin every day.  Taking vitamin and mineral supplements as recommended by your health care provider. What happens during an annual well check? The services and screenings done by your health care provider during your annual well check will depend on your age, overall health, lifestyle risk factors, and family history of  disease. Counseling  Your health care provider may ask you questions about your:  Alcohol use.  Tobacco use.  Drug use.  Emotional well-being.  Home and relationship well-being.  Sexual activity.  Eating habits.  History of falls.  Memory and ability to understand (cognition).  Work and work Statistician. Screening  You may have the following tests or measurements:  Height, weight, and BMI.  Blood pressure.  Lipid and cholesterol levels. These may be checked every 5 years, or more frequently if you are over 82 years old.  Skin check.  Lung cancer screening. You may have this screening every year starting at age 29 if you have a 30-pack-year history of smoking and currently smoke or have quit within the past 15 years.  Fecal occult blood test (FOBT) of the stool. You may have this test every year starting at age 82.  Flexible sigmoidoscopy or colonoscopy. You may have a sigmoidoscopy every 5 years or a colonoscopy every 10 years starting at age 82.  Prostate cancer screening. Recommendations will vary depending on your family history and other risks.  Hepatitis C blood test.  Hepatitis B blood test.  Sexually transmitted disease (STD) testing.  Diabetes screening. This is done by checking your blood sugar (glucose) after you have not eaten for a while (fasting). You may have this done every 1-3 years.  Abdominal aortic aneurysm (AAA) screening. You may need this if you are a current or former smoker.  Osteoporosis. You may be screened starting at age 55 if you are at high risk. Talk with your health care provider about your test results, treatment options, and if necessary, the need for more tests. Vaccines  Your  health care provider may recommend certain vaccines, such as:  Influenza vaccine. This is recommended every year.  Tetanus, diphtheria, and acellular pertussis (Tdap, Td) vaccine. You may need a Td booster every 10 years.  Zoster vaccine. You may  need this after age 82.  Pneumococcal 13-valent conjugate (PCV13) vaccine. One dose is recommended after age 82.  Pneumococcal polysaccharide (PPSV23) vaccine. One dose is recommended after age 82. Talk to your health care provider about which screenings and vaccines you need and how often you need them. This information is not intended to replace advice given to you by your health care provider. Make sure you discuss any questions you have with your health care provider. Document Released: 12/23/2015 Document Revised: 08/15/2016 Document Reviewed: 09/27/2015 Elsevier Interactive Patient Education  2017 [place] Prevention in the Home Falls can cause injuries. They can happen to people of all ages. There are many things you can do to make your home safe and to help prevent falls. What can I do on the outside of my home?  Regularly fix the edges of walkways and driveways and fix any cracks.  Remove anything that might make you trip as you walk through a door, such as a raised step or threshold.  Trim any bushes or trees on the path to your home.  Use bright outdoor lighting.  Clear any walking paths of anything that might make someone trip, such as rocks or tools.  Regularly check to see if handrails are loose or broken. Make sure that both sides of any steps have handrails.  Any raised decks and porches should have guardrails on the edges.  Have any leaves, snow, or ice cleared regularly.  Use sand or salt on walking paths during winter.  Clean up any spills in your garage right away. This includes oil or grease spills. What can I do in the bathroom?  Use night lights.  Install grab bars by the toilet and in the tub and shower. Do not use towel bars as grab bars.  Use non-skid mats or decals in the tub or shower.  If you need to sit down in the shower, use a plastic, non-slip stool.  Keep the floor dry. Clean up any water that spills on the floor as soon as it  happens.  Remove soap buildup in the tub or shower regularly.  Attach bath mats securely with double-sided non-slip rug tape.  Do not have throw rugs and other things on the floor that can make you trip. What can I do in the bedroom?  Use night lights.  Make sure that you have a light by your bed that is easy to reach.  Do not use any sheets or blankets that are too big for your bed. They should not hang down onto the floor.  Have a firm chair that has side arms. You can use this for support while you get dressed.  Do not have throw rugs and other things on the floor that can make you trip. What can I do in the kitchen?  Clean up any spills right away.  Avoid walking on wet floors.  Keep items that you use a lot in easy-to-reach places.  If you need to reach something above you, use a strong step stool that has a grab bar.  Keep electrical cords out of the way.  Do not use floor polish or wax that makes floors slippery. If you must use wax, use non-skid floor wax.  Do not  have throw rugs and other things on the floor that can make you trip. What can I do with my stairs?  Do not leave any items on the stairs.  Make sure that there are handrails on both sides of the stairs and use them. Fix handrails that are broken or loose. Make sure that handrails are as long as the stairways.  Check any carpeting to make sure that it is firmly attached to the stairs. Fix any carpet that is loose or worn.  Avoid having throw rugs at the top or bottom of the stairs. If you do have throw rugs, attach them to the floor with carpet tape.  Make sure that you have a light switch at the top of the stairs and the bottom of the stairs. If you do not have them, ask someone to add them for you. What else can I do to help prevent falls?  Wear shoes that:  Do not have high heels.  Have rubber bottoms.  Are comfortable and fit you well.  Are closed at the toe. Do not wear sandals.  If you  use a stepladder:  Make sure that it is fully opened. Do not climb a closed stepladder.  Make sure that both sides of the stepladder are locked into place.  Ask someone to hold it for you, if possible.  Clearly mark and make sure that you can see:  Any grab bars or handrails.  First and last steps.  Where the edge of each step is.  Use tools that help you move around (mobility aids) if they are needed. These include:  Canes.  Walkers.  Scooters.  Crutches.  Turn on the lights when you go into a dark area. Replace any light bulbs as soon as they burn out.  Set up your furniture so you have a clear path. Avoid moving your furniture around.  If any of your floors are uneven, fix them.  If there are any pets around you, be aware of where they are.  Review your medicines with your doctor. Some medicines can make you feel dizzy. This can increase your chance of falling. Ask your doctor what other things that you can do to help prevent falls. This information is not intended to replace advice given to you by your health care provider. Make sure you discuss any questions you have with your health care provider. Document Released: 09/22/2009 Document Revised: 05/03/2016 Document Reviewed: 12/31/2014 Elsevier Interactive Patient Education  2017 [employer].

## 2018-11-10 ENCOUNTER — Ambulatory Visit (INDEPENDENT_AMBULATORY_CARE_PROVIDER_SITE_OTHER): Payer: PPO | Admitting: Family Medicine

## 2018-11-10 ENCOUNTER — Encounter: Payer: Self-pay | Admitting: Family Medicine

## 2018-11-10 VITALS — BP 164/70 | HR 54 | Temp 97.9°F | Resp 16 | Ht 67.0 in | Wt 173.0 lb

## 2018-11-10 DIAGNOSIS — I2510 Atherosclerotic heart disease of native coronary artery without angina pectoris: Secondary | ICD-10-CM | POA: Diagnosis not present

## 2018-11-10 DIAGNOSIS — Z6827 Body mass index (BMI) 27.0-27.9, adult: Secondary | ICD-10-CM

## 2018-11-10 DIAGNOSIS — Z9861 Coronary angioplasty status: Secondary | ICD-10-CM | POA: Diagnosis not present

## 2018-11-10 DIAGNOSIS — I10 Essential (primary) hypertension: Secondary | ICD-10-CM

## 2018-11-10 DIAGNOSIS — E1122 Type 2 diabetes mellitus with diabetic chronic kidney disease: Secondary | ICD-10-CM

## 2018-11-10 DIAGNOSIS — N181 Chronic kidney disease, stage 1: Secondary | ICD-10-CM | POA: Diagnosis not present

## 2018-11-10 DIAGNOSIS — E785 Hyperlipidemia, unspecified: Secondary | ICD-10-CM | POA: Diagnosis not present

## 2018-11-10 NOTE — Progress Notes (Signed)
Patient: Charles Muscatello., Male    DOB: 1932-01-09, 84 y.o.   MRN: [id number] Visit Date: 11/10/2018  Today's Provider: [name], PA   Chief Complaint  Patient presents with  . Annual Exam   Subjective:     Complete Physical Charles Sellers. is a 84 y.o. male. He feels fairly well. He reports exercising none. He reports he is sleeping fairly well.  -----------------------------------------------------------  Diabetes Mellitus Type II, Follow-up:   Lab Results  Component Value Date   HGBA1C 7.2 (A) 07/21/2018   HGBA1C 7.6 (H) 04/04/2018   HGBA1C 7.5 01/02/2018   Last seen for diabetes 4 months ago.  Management since then includes; labs checked, no changes. He reports good compliance with treatment. He is not having side effects. none Current symptoms include none and have been unchanged. Home blood sugar records: fasting range: 120-132  Episodes of hypoglycemia? no   Current Insulin Regimen: n/a Most Recent Eye Exam: 07/16/18 Weight trend: stable Prior visit with dietician: no Current diet: diabetic Current exercise: none  --------------------------------------------------------------   Hypertension, follow-up:  BP Readings from Last 3 Encounters:  11/10/18 (!) 164/70  10/24/18 124/60  09/15/18 130/66    He was last seen for hypertension 4 months ago.  BP at that visit was 126/72. Management since that visit includes; labs checked, no changes. Advised he may add otc Co-Q 10 100 mg qd.He reports good compliance with treatment. He is not having side effects. none He is not exercising. He is adherent to low salt diet.   Outside blood pressures are not checking. He is experiencing none.  Patient denies none.   Cardiovascular risk factors include diabetes mellitus.  Use of agents associated with hypertension: none.   --------------------------------------------------------------   Lipid/Cholesterol, Follow-up:   Last seen for this 4  months ago.  Management since that visit includes; labs checked, no changes.  Last Lipid Panel:    Component Value Date/Time   CHOL 131 07/21/2018 0916   TRIG 72 07/21/2018 0916   HDL 38 (L) 07/21/2018 0916   CHOLHDL 3.4 07/21/2018 0916   CHOLHDL 3.2 06/06/2008 2020   VLDL 6 06/06/2008 2020   LDLCALC 79 07/21/2018 0916    He reports good compliance with treatment. He is not having side effects. none  Wt Readings from Last 3 Encounters:  11/10/18 173 lb (78.5 kg)  10/24/18 177 lb 3.2 oz (80.4 kg)  09/15/18 175 lb (79.4 kg)    ---------------------------------------------------------------    Review of Systems  All other systems reviewed and are negative.   Social History   Socioeconomic History  . Marital status: Married    Spouse name: Not on file  . Number of children: 4  . Years of education: Not on file  . Highest education level: High school graduate  Occupational History  . Occupation: retired    Comment: previously worked at [employer]  . Financial resource strain: Not hard at all  . Food insecurity:    Worry: Never true    Inability: Never true  . Transportation needs:    Medical: No    Non-medical: No  Tobacco Use  . Smoking status: Never Smoker  . Smokeless tobacco: Never Used  Substance and Sexual Activity  . Alcohol use: No    Alcohol/week: 0.0 standard drinks  . Drug use: No  . Sexual activity: Not on file  Lifestyle  . Physical activity:    Days per week:  0 days    Minutes per session: 0 min  . Stress: Not at all  Relationships  . Social connections:    Talks on phone: Patient refused    Gets together: Patient refused    Attends religious service: Patient refused    Active member of club or organization: Patient refused    Attends meetings of clubs or organizations: Patient refused    Relationship status: Patient refused  . Intimate partner violence:    Fear of current or ex partner: Patient refused     Emotionally abused: Patient refused    Physically abused: Patient refused    Forced sexual activity: Patient refused  Other Topics Concern  . Not on file  Social History Narrative   Married father of 3, 9 grandchildren, now 4 great-grandchildren. 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   Using the toilet, but does not do routine exercise. Always on the go.   Never smoked. Does not drink alcohol.    Past Medical History:  Diagnosis Date  . Atherosclerosis   . CAD (coronary artery disease) 2011 & 2009   Echo, EF =>55% 2011/ Echo, EF =>55% 2009  . CAD S/P percutaneous coronary angioplasty 12/13 & 15 2005   PCI - mRCA (Cypher DES 3.5 mm x 18 mm --> 4.23mm); RPL Cypher 2.5 mm x 13 mm;; mLAD Cy a pher DES 3.0 mm x 13 mm (3.5 mm) with Cutting PTCA of D2 ostium;; mid Cx 100% after small OM1.  . Cholecystitis   . Dermatitis   . Diabetes mellitus type II, controlled ([place])    Diet controlled. No medications; he indicates "borderline diabetes"  . Dyslipidemia, goal LDL below 70     on statin  . Hypertension   . Leaky heart valve   . Prostate nodule   . Ventral hernia     Patient Active Problem List   Diagnosis Date Noted  . Mild aortic stenosis 09/04/2017  . 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 Mitral regurgitation 07/30/2017  . History of prolonged Q-T interval on ECG 06/22/2015  . Prostate lump 06/22/2015  . 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 Diabetes mellitus, type 2 ([place]) 06/22/2015  . CAD S/P percutaneous coronary angioplasty   . Diabetes mellitus type II, controlled ([place])   . Dyslipidemia, goal LDL below 70   . Essential hypertension   . Arthropathia 02/17/2010  . Chondrocalcinosis of multiple sites 08/11/2008  . Acid reflux 08/11/2008  . Cannot sleep 07/23/2008  . UNSPECIFIED INFLAMMATORY POLYARTHROPATHY 07/20/2008  . MYOSITIS 07/20/2008  . FEVER  UNSPECIFIED 07/20/2008  . EXANTHEM 07/20/2008  . Muscle ache 07/16/2008  . Disorder of sacrum 06/19/2008  . Thyrotoxicosis 06/19/2008   Past Surgical History:  Procedure Laterality Date  . APPENDECTOMY    . CARDIAC CATHETERIZATION  2005  . Carotid Dopplers  September 2014   Bilateral internal carotids < 49%.  . CORONARY ANGIOPLASTY WITH STENT PLACEMENT     mRCA Cypher DES 3.5 mm x 18 mm (4.0), RPL Cypher DES 2.5 mm x 13 mm; mLAD 3.0 mm x 13 mm Cypher -> cutting PTCA of jailed D2; previousl PCI of Cx - 100% mid Cx.  Marland Kitchen NM MYOVIEW LTD  06/2012   Small area of basal inferior infarct with no ischemia. Normal EF  . Stress Myoview  2013   normal findings  . TRANSTHORACIC ECHOCARDIOGRAM  February  2014    EF 55%. Mild-moderate MR   His family history includes Cerebral aneurysm in his daughter and son; Diabetes in his father; Esophageal cancer in his brother; Liver cancer in his father; Melanoma in his daughter; Stroke in his mother.     Current Outpatient Medications:  .  APPLE CIDER VINEGAR PO, Take by mouth., Disp: , Rfl:  .  cholecalciferol (VITAMIN D) 1000 UNITS tablet, Take 1,000 Units by mouth daily., Disp: , Rfl:  .  clopidogrel (PLAVIX) 75 MG tablet, Take 1 tablet (75 mg total) by mouth daily., Disp: 180 tablet, Rfl: 3 .  INVOKANA 100 MG TABS tablet, TAKE 1 TABLET BY MOUTH DAILY BEFORE BREAKFAST, Disp: 90 tablet, Rfl: 1 .  metoprolol succinate (TOPROL-XL) 25 MG 24 hr tablet, Take 0.5 tablets (12.5 mg total) by mouth daily., Disp: 45 tablet, Rfl: 3 .  Multiple Vitamin (MULTIVITAMIN) tablet, Take 1 tablet by mouth daily., Disp: , Rfl:  .  Multiple Vitamins-Minerals (ICAPS AREDS 2 PO), Take 1 capsule by mouth daily., Disp: , Rfl:  .  naproxen sodium (ANAPROX) 220 MG tablet, Take 220 mg by mouth daily as needed. , Disp: , Rfl:  .  nitroGLYCERIN (NITROSTAT) 0.4 MG SL tablet, Place 1 tablet (0.4 mg total) under the tongue every 5 (five) minutes as needed for chest pain., Disp: 25 tablet, Rfl:  3 .  Omega-3 Fatty Acids (FISH OIL) 1000 MG CAPS, Take 1,000 mg by mouth daily., Disp: , Rfl:  .  ONE TOUCH ULTRA TEST test strip, USE AS DIRECTED ONCE DAILY, Disp: 100 each, Rfl: 4 .  ONETOUCH DELICA LANCETS FINE MISC, One finger stick daily for fasting blood sugar check., Disp: 100 each, Rfl: 3 .  pantoprazole (PROTONIX) 40 MG tablet, TAKE ONE (1) TABLET BY MOUTH DAILY ON MONDAY, WEDNESDAY, & FRIDAY OF EACH WEEK, Disp: 12 tablet, Rfl: 3 .  ramipril (ALTACE) 5 MG capsule, TAKE 1 CAPSULE BY MOUTH EVERY DAY, Disp: 128 capsule, Rfl: 0 .  rosuvastatin (CRESTOR) 10 MG tablet, Take 1 tablet (10 mg total) by mouth daily., Disp: 90 tablet, Rfl: 3  Patient Care Team: Venba Zenner, Vickki Muff, PA as PCP - General (Physician Assistant) Leonie Man, MD as PCP - Cardiology (Cardiology) Birder Robson, MD as Referring Physician (Ophthalmology) Ripley Fraise., MD (General Practice)    Objective:   Vitals: BP (!) 164/70 (BP Location: Right Arm, Patient Position: Sitting, Cuff Size: Large)   Pulse (!) 54   Temp 97.9 F (36.6 C) (Oral)   Resp 16   Ht 5\' 7"  (1.702 m)   Wt 173 lb (78.5 kg)   SpO2 95%   BMI 27.10 kg/m   Physical Exam  Constitutional: He is oriented to person, place, and time. He appears well-developed and well-nourished.  HENT:  Head: Normocephalic and atraumatic.  Right Ear: External ear normal.  Left Ear: External ear normal.  Nose: Nose normal.  Mouth/Throat: Oropharynx is clear and moist.  Eyes: Pupils are equal, round, and reactive to light. Conjunctivae and EOM are normal. Right eye exhibits no discharge.  Neck: Normal range of motion. Neck supple. No tracheal deviation present. No thyromegaly present.  Cardiovascular: Normal rate, regular rhythm, normal heart sounds and intact distal pulses.  No murmur heard. Pulmonary/Chest: Effort normal and breath sounds normal. No respiratory distress. He has no wheezes. He has no rales. He exhibits no tenderness.  Abdominal:  Soft. He exhibits no distension and no mass. There is no tenderness. There is no rebound and no  guarding.  Musculoskeletal: Normal range of motion. He exhibits no edema or tenderness.  Lymphadenopathy:    He has no cervical adenopathy.  Neurological: He is alert and oriented to person, place, and time. He has normal reflexes. He displays normal reflexes. No cranial nerve deficit. He exhibits normal muscle tone. Coordination normal.  Skin: Skin is warm and dry. No rash noted. No erythema.  Psychiatric: He has a normal mood and affect. His behavior is normal. Judgment and thought content normal.    Activities of Daily Living In your present state of health, do you have any difficulty performing the following activities: 10/24/2018  Hearing? N  Vision? N  Comment Wears eye glasses.  Difficulty concentrating or making decisions? N  Walking or climbing stairs? N  Dressing or bathing? N  Doing errands, shopping? N  Preparing Food and eating ? N  Using the Toilet? N  In the past six months, have you accidently leaked urine? N  Do you have problems with loss of bowel control? N  Managing your Medications? N  Managing your Finances? N  Housekeeping or managing your Housekeeping? N  Some recent data might be hidden    Fall Risk Assessment Fall Risk  10/24/2018 10/18/2017 06/18/2017 01/09/2016 08/31/2015  Falls in the past year? 0 No No No No     Depression Screen PHQ 2/9 Scores 10/24/2018 10/18/2017 06/18/2017 01/09/2016  PHQ - 2 Score 3 0 0 0  PHQ- 9 Score 3 - 0 -   Assessment & Plan:    Annual Physical Reviewed patient's Family Medical History Reviewed and updated list of patient's medical providers Assessment of cognitive impairment was done Assessed patient's functional ability Established a written schedule for health screening [place] Completed and Reviewed  Exercise Activities and Dietary recommendations Goals    . DIET - INCREASE LEAN PROTEINS      Recommend to cut out the fried fatty meats and start eating more baked or broiled chicken instead.     . Increase water intake     Recommend increasing water intake to 6-8 glasses a day.        Immunization History  Administered Date(s) Administered  . Influenza, High Dose Seasonal PF 10/18/2017, 10/24/2018  . Pneumococcal Conjugate-13 01/09/2016  . Pneumococcal Polysaccharide-23 08/04/2008  . Tdap 01/09/2016    Health Maintenance  Topic Date Due  . FOOT EXAM  01/02/2019  . HEMOGLOBIN A1C  01/21/2019  . OPHTHALMOLOGY EXAM  07/17/2019  . TETANUS/TDAP  01/08/2026  . INFLUENZA VACCINE  Completed  . PNA vac Low Risk Adult  Completed    Discussed health benefits of physical activity, and encouraged him to engage in regular exercise appropriate for his age and condition.    ------------------------------------------------------------------------- 1. Controlled type 2 diabetes mellitus with stage 1 chronic kidney disease, without long-term current use of insulin (HCC) FBS near stable levels at 120-130's recently. Still taking the Invokana 100 mg q am with diabetic low fat diet. Has lost 4 lbs in the past 2-3 weeks. Had eye exam 07-16-18 and normal foot exam 01-02-18. Immunizations up to date. Will get follow up labs and recheck pending reports. - CBC with Differential/Platelet - Comprehensive metabolic panel - Lipid panel - Hemoglobin A1c  2. Essential hypertension Systolic BP elevated with extra salty snack and heavy eating over Thanksgiving Day last week. No edema or dyspnea. Continues to take Altace 5 mg qd with Metoprolol Succinate 25 mg qd and had cardiologist follow up on 09-15-18. Recheck labs  and follow up as needed. - CBC with Differential/Platelet - Comprehensive metabolic panel - Lipid panel  3. CAD S/P percutaneous coronary angioplasty Denies chest pains, palpitations, edema or dyspnea. No recent use of NTG SL. Followed by Dr. Ellyn Hack (cardiologist) on 09-15-18 for history  of 2 vessel PCI with Cypher DES in Dec. 2005. Echo February of 2014 showed EF 55% with mild to moderate MR. Normal stress myoview 2013. Recheck CMP and Lipid Panel. - Comprehensive metabolic panel - Lipid panel  4. Dyslipidemia, goal LDL below 70 Tolerating Crestor 10 mg qd without side effects. Recheck CMP and Lipid Panel. Continue low fat diet with regular exercise. - Comprehensive metabolic panel - Lipid panel  5. BMI 27.0-27.9,adult Lost 4 lbs in the past month. 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 Recheck labs and follow up pending reports. - CBC with Differential/Platelet - Comprehensive metabolic panel - TSH   [name], PA  [facility]

## 2018-11-11 ENCOUNTER — Telehealth: Payer: Self-pay | Admitting: *Deleted

## 2018-11-11 LAB — COMPREHENSIVE METABOLIC PANEL
A/G RATIO: 1.9 (ref 1.2–2.2)
ALT: 21 IU/L (ref 0–44)
AST: 18 IU/L (ref 0–40)
Albumin: 4.7 g/dL (ref 3.5–4.7)
Alkaline Phosphatase: 66 IU/L (ref 39–117)
BILIRUBIN TOTAL: 1.2 mg/dL (ref 0.0–1.2)
BUN/Creatinine Ratio: 19 (ref 10–24)
BUN: 11 mg/dL (ref 8–27)
CO2: 23 mmol/L (ref 20–29)
Calcium: 9.2 mg/dL (ref 8.6–10.2)
Chloride: 100 mmol/L (ref 96–106)
Creatinine, Ser: 0.59 mg/dL — ABNORMAL LOW (ref 0.76–1.27)
GFR, EST AFRICAN AMERICAN: 107 mL/min/{1.73_m2} (ref >59)
GFR, EST NON AFRICAN AMERICAN: 93 mL/min/{1.73_m2} (ref >59)
GLUCOSE: 91 mg/dL (ref 65–99)
Globulin, Total: 2.5 g/dL (ref 1.5–4.5)
POTASSIUM: 4.1 mmol/L (ref 3.5–5.2)
SODIUM: 142 mmol/L (ref 134–144)
Total Protein: 7.2 g/dL (ref 6.0–8.5)

## 2018-11-11 LAB — CBC WITH DIFFERENTIAL/PLATELET
BASOS ABS: 0.1 10*3/uL (ref 0.0–0.2)
Basos: 1 %
EOS (ABSOLUTE): 0.1 10*3/uL (ref 0.0–0.4)
Eos: 2 %
HEMOGLOBIN: 16.2 g/dL (ref 13.0–17.7)
Hematocrit: 46.3 % (ref 37.5–51.0)
Immature Grans (Abs): 0 10*3/uL (ref 0.0–0.1)
Immature Granulocytes: 0 %
LYMPHS ABS: 1.4 10*3/uL (ref 0.7–3.1)
Lymphs: 25 %
MCH: 30.3 pg (ref 26.6–33.0)
MCHC: 35 g/dL (ref 31.5–35.7)
MCV: 87 fL (ref 79–97)
MONOCYTES: 11 %
Monocytes Absolute: 0.6 10*3/uL (ref 0.1–0.9)
Neutrophils Absolute: 3.5 10*3/uL (ref 1.4–7.0)
Neutrophils: 61 %
Platelets: 183 10*3/uL (ref 150–450)
RBC: 5.34 x10E6/uL (ref 4.14–5.80)
RDW: 12.6 % (ref 12.3–15.4)
WBC: 5.6 10*3/uL (ref 3.4–10.8)

## 2018-11-11 LAB — HEMOGLOBIN A1C
Est. average glucose Bld gHb Est-mCnc: 148 mg/dL
HEMOGLOBIN A1C: 6.8 % — AB (ref 4.8–5.6)

## 2018-11-11 LAB — LIPID PANEL
CHOL/HDL RATIO: 3.1 ratio (ref 0.0–5.0)
Cholesterol, Total: 128 mg/dL (ref 100–199)
HDL: 41 mg/dL (ref >39)
LDL Calculated: 75 mg/dL (ref 0–99)
TRIGLYCERIDES: 58 mg/dL (ref 0–149)
VLDL Cholesterol Cal: 12 mg/dL (ref 5–40)

## 2018-11-11 LAB — TSH: TSH: 0.862 u[IU]/mL (ref 0.450–4.500)

## 2018-11-11 NOTE — Telephone Encounter (Signed)
-----   Message from Margo Common, [place] sent at 11/11/2018  1:42 PM EST ----- Final report of blood tests show normal thyroid function. Recheck appointment as planned.

## 2018-11-11 NOTE — Telephone Encounter (Signed)
No answer and no vm. Will try again later.  

## 2018-11-13 NOTE — Telephone Encounter (Signed)
No answer and no vm

## 2018-11-17 NOTE — Telephone Encounter (Signed)
No answer/mail box is full. Closing encounter. Patient is scheduled to see Provider 11/24/18.  Thanks,  -Cionna Collantes

## 2018-11-21 NOTE — Progress Notes (Signed)
Patient: Charles Sellers. Male    DOB: 1932-05-19   84 y.o.   MRN: [id number] Visit Date: 11/24/2018  Today's Provider: [name], PA   Chief Complaint  Patient presents with  . Follow-up  . Hypertension   Subjective:     HPI    Hypertension, follow-up:  BP Readings from Last 3 Encounters:  11/24/18 (!) 151/74  11/10/18 (!) 164/70  10/24/18 124/60    He was last seen for hypertension 2 weeks ago.  BP at that visit was 164/70. Management since that visit includes; labs done, no changes. Advised to follow up for recheck in 2 weeks.He reports good compliance with treatment. He is not having side effects. none He is exercising. He is adherent to low salt diet.   Outside blood pressures are not checking. He is experiencing none.  Patient denies none.   Cardiovascular risk factors include advanced age (older than 87 for men, 79 for women) and diabetes mellitus.  Use of agents associated with hypertension: none.   --------------------------------------------------------------- Past Medical History:  Diagnosis Date  . Atherosclerosis   . CAD (coronary artery disease) 2011 & 2009   Echo, EF =>55% 2011/ Echo, EF =>55% 2009  . CAD S/P percutaneous coronary angioplasty 12/13 & 15 2005   PCI - mRCA (Cypher DES 3.5 mm x 18 mm --> 4.68mm); RPL Cypher 2.5 mm x 13 mm;; mLAD Cy a pher DES 3.0 mm x 13 mm (3.5 mm) with Cutting PTCA of D2 ostium;; mid Cx 100% after small OM1.  . Cholecystitis   . Dermatitis   . Diabetes mellitus type II, controlled ([place])    Diet controlled. No medications; he indicates "borderline diabetes"  . Dyslipidemia, goal LDL below 70     on statin  . Hypertension   . Leaky heart valve   . Prostate nodule   . Ventral hernia    Past Surgical History:  Procedure Laterality Date  . APPENDECTOMY    . CARDIAC CATHETERIZATION  2005  . Carotid Dopplers  September 2014   Bilateral internal carotids < 49%.  . CORONARY ANGIOPLASTY WITH STENT  PLACEMENT     mRCA Cypher DES 3.5 mm x 18 mm (4.0), RPL Cypher DES 2.5 mm x 13 mm; mLAD 3.0 mm x 13 mm Cypher -> cutting PTCA of jailed D2; previousl PCI of Cx - 100% mid Cx.  Marland Kitchen NM MYOVIEW LTD  06/2012   Small area of basal inferior infarct with no ischemia. Normal EF  . Stress Myoview  2013   normal findings  . TRANSTHORACIC ECHOCARDIOGRAM  February 2014    EF 55%. Mild-moderate MR   Family History  Problem Relation Age of Onset  . Stroke Mother   . Liver cancer Father   . Diabetes Father   . Esophageal cancer Brother   . Cerebral aneurysm Daughter   . Melanoma Daughter   . Cerebral aneurysm Son    Allergies  Allergen Reactions  . Niaspan [Niacin Er] Rash  . Penicillins Rash    Current Outpatient Medications:  .  cholecalciferol (VITAMIN D) 1000 UNITS tablet, Take 1,000 Units by mouth daily., Disp: , Rfl:  .  clopidogrel (PLAVIX) 75 MG tablet, Take 1 tablet (75 mg total) by mouth daily., Disp: 180 tablet, Rfl: 3 .  metoprolol succinate (TOPROL-XL) 25 MG 24 hr tablet, Take 0.5 tablets (12.5 mg total) by mouth daily., Disp: 45 tablet, Rfl: 3 .  Multiple Vitamin (MULTIVITAMIN) tablet, Take 1 tablet  by mouth daily., Disp: , Rfl:  .  Multiple Vitamins-Minerals (ICAPS AREDS 2 PO), Take 1 capsule by mouth daily., Disp: , Rfl:  .  naproxen sodium (ANAPROX) 220 MG tablet, Take 220 mg by mouth daily as needed. , Disp: , Rfl:  .  nitroGLYCERIN (NITROSTAT) 0.4 MG SL tablet, Place 1 tablet (0.4 mg total) under the tongue every 5 (five) minutes as needed for chest pain., Disp: 25 tablet, Rfl: 3 .  Omega-3 Fatty Acids (FISH OIL) 1000 MG CAPS, Take 1,000 mg by mouth daily., Disp: , Rfl:  .  ONE TOUCH ULTRA TEST test strip, USE AS DIRECTED ONCE DAILY, Disp: 100 each, Rfl: 4 .  ONETOUCH DELICA LANCETS FINE MISC, One finger stick daily for fasting blood sugar check., Disp: 100 each, Rfl: 3 .  pantoprazole (PROTONIX) 40 MG tablet, TAKE ONE (1) TABLET BY MOUTH DAILY ON MONDAY, WEDNESDAY, & FRIDAY OF  EACH WEEK, Disp: 12 tablet, Rfl: 3 .  ramipril (ALTACE) 5 MG capsule, TAKE 1 CAPSULE BY MOUTH EVERY DAY, Disp: 128 capsule, Rfl: 0 .  rosuvastatin (CRESTOR) 10 MG tablet, Take 1 tablet (10 mg total) by mouth daily., Disp: 90 tablet, Rfl: 3 .  APPLE CIDER VINEGAR PO, Take by mouth., Disp: , Rfl:  .  INVOKANA 100 MG TABS tablet, TAKE 1 TABLET BY MOUTH DAILY BEFORE BREAKFAST (Patient not taking: Reported on 11/24/2018), Disp: 90 tablet, Rfl: 1  Review of Systems  Constitutional: Negative for appetite change, chills and fever.  Respiratory: Negative for chest tightness, shortness of breath and wheezing.   Cardiovascular: Negative for chest pain and palpitations.  Gastrointestinal: Negative for abdominal pain, nausea and vomiting.   Social History   Tobacco Use  . Smoking status: Never Smoker  . Smokeless tobacco: Never Used  Substance Use Topics  . Alcohol use: No    Alcohol/week: 0.0 standard drinks     Objective:   BP (!) 151/74 (BP Location: Right Arm, Patient Position: Sitting, Cuff Size: Large)   Pulse 61   Temp 97.7 F (36.5 C) (Oral)   Resp 16   Wt 173 lb (78.5 kg)   SpO2 98%   BMI 27.10 kg/m   Wt Readings from Last 3 Encounters:  11/24/18 173 lb (78.5 kg)  11/10/18 173 lb (78.5 kg)  10/24/18 177 lb 3.2 oz (80.4 kg)   Vitals:   11/24/18 0845  BP: (!) 151/74  Pulse: 61  Resp: 16  Temp: 97.7 F (36.5 C)  TempSrc: Oral  SpO2: 98%  Weight: 173 lb (78.5 kg)   Physical Exam Constitutional:      General: He is not in acute distress.    Appearance: He is well-developed.  HENT:     Head: Normocephalic and atraumatic.     Right Ear: Hearing normal.     Left Ear: Hearing normal.     Nose: Nose normal.  Eyes:     General: Lids are normal. No scleral icterus.       Right eye: No discharge.        Left eye: No discharge.     Conjunctiva/sclera: Conjunctivae normal.  Cardiovascular:     Rate and Rhythm: Normal rate and regular rhythm.  Pulmonary:     Effort:  Pulmonary effort is normal. No respiratory distress.     Breath sounds: Normal breath sounds.  Abdominal:     General: Bowel sounds are normal.  Musculoskeletal: Normal range of motion.  Skin:    Findings: No lesion or rash.  Neurological:     Mental Status: He is alert and oriented to person, place, and time.  Psychiatric:        Speech: Speech normal.        Behavior: Behavior normal.        Thought Content: Thought content normal.      Assessment & Plan    1. Essential hypertension Improvement in BP reading today. No chest pains, dyspnea or edema. Promises to restrict salt intake and continues Ramipril 5 mg qd. Recheck in 3 months.  2. BMI 27.0-27.9,adult Stable weight. Diabetes in fair shape. Recent FBS in the 120-130's range. Stopped taking the Invokana due to price going up (less insurance coverage). States he will continue to check FBS daily. If blood sugar climbing, will need to consider starting Jardiance (wants to check on insurance coverage first. Recheck in 3 months. Hgb A1C was 6.8% on 11-10-18.     [name], PA  [facility]

## 2018-11-24 ENCOUNTER — Ambulatory Visit (INDEPENDENT_AMBULATORY_CARE_PROVIDER_SITE_OTHER): Payer: PPO | Admitting: Family Medicine

## 2018-11-24 ENCOUNTER — Encounter: Payer: Self-pay | Admitting: Family Medicine

## 2018-11-24 VITALS — BP 151/74 | HR 61 | Temp 97.7°F | Resp 16 | Wt 173.0 lb

## 2018-11-24 DIAGNOSIS — I10 Essential (primary) hypertension: Secondary | ICD-10-CM

## 2018-11-24 DIAGNOSIS — Z6827 Body mass index (BMI) 27.0-27.9, adult: Secondary | ICD-10-CM

## 2019-01-02 ENCOUNTER — Ambulatory Visit (INDEPENDENT_AMBULATORY_CARE_PROVIDER_SITE_OTHER): Payer: PPO | Admitting: Family Medicine

## 2019-01-02 ENCOUNTER — Encounter: Payer: Self-pay | Admitting: Family Medicine

## 2019-01-02 ENCOUNTER — Other Ambulatory Visit: Payer: Self-pay | Admitting: Family Medicine

## 2019-01-02 ENCOUNTER — Telehealth: Payer: Self-pay | Admitting: Family Medicine

## 2019-01-02 VITALS — BP 148/60 | HR 68 | Temp 98.3°F | Resp 16 | Wt 178.2 lb

## 2019-01-02 DIAGNOSIS — J069 Acute upper respiratory infection, unspecified: Secondary | ICD-10-CM

## 2019-01-02 MED ORDER — PSEUDOEPH-BROMPHEN-DM 30-2-10 MG/5ML PO SYRP: 5.0000 mL | ORAL_SOLUTION | Freq: Four times a day (QID) | ORAL | 0 refills | Status: DC | PRN

## 2019-01-02 MED ORDER — PROMETHAZINE-PHENYLEPHRINE 6.25-5 MG/5ML PO SYRP: 5.0000 mL | ORAL_SOLUTION | Freq: Four times a day (QID) | ORAL | 0 refills | Status: DC | PRN

## 2019-01-02 MED ORDER — AZITHROMYCIN 250 MG PO TABS: ORAL_TABLET | ORAL | 0 refills | Status: DC

## 2019-01-02 NOTE — Telephone Encounter (Signed)
Done

## 2019-01-02 NOTE — Progress Notes (Signed)
Patient: Charles Sellers. Male    DOB: October 15, 1932   85 y.o.   MRN: [id number] Visit Date: 01/02/2019  Today's Provider: [name], PA   Chief Complaint  Patient presents with  . URI   Subjective:     URI   This is a new problem. The current episode started in the past 7 days (Started Tuesday). The problem has been gradually worsening. There has been no fever. Associated symptoms include congestion, coughing, a plugged ear sensation, rhinorrhea, sinus pain, sneezing, a sore throat and swollen glands. Pertinent negatives include no chest pain, ear pain, headaches, vomiting or wheezing. Associated symptoms comments: Nosebleed this morning.Marland Kitchen He has tried decongestant (Mucinex ) for the symptoms. The treatment provided no relief.   Past Medical History:  Diagnosis Date  . Atherosclerosis   . CAD (coronary artery disease) 2011 & 2009   Echo, EF =>55% 2011/ Echo, EF =>55% 2009  . CAD S/P percutaneous coronary angioplasty 12/13 & 15 2005   PCI - mRCA (Cypher DES 3.5 mm x 18 mm --> 4.4mm); RPL Cypher 2.5 mm x 13 mm;; mLAD Cy a pher DES 3.0 mm x 13 mm (3.5 mm) with Cutting PTCA of D2 ostium;; mid Cx 100% after small OM1.  . Cholecystitis   . Dermatitis   . Diabetes mellitus type II, controlled ([place])    Diet controlled. No medications; he indicates "borderline diabetes"  . Dyslipidemia, goal LDL below 70     on statin  . Hypertension   . Leaky heart valve   . Prostate nodule   . Ventral hernia    Past Surgical History:  Procedure Laterality Date  . APPENDECTOMY    . CARDIAC CATHETERIZATION  2005  . Carotid Dopplers  September 2014   Bilateral internal carotids < 49%.  . CORONARY ANGIOPLASTY WITH STENT PLACEMENT     mRCA Cypher DES 3.5 mm x 18 mm (4.0), RPL Cypher DES 2.5 mm x 13 mm; mLAD 3.0 mm x 13 mm Cypher -> cutting PTCA of jailed D2; previousl PCI of Cx - 100% mid Cx.  Marland Kitchen NM MYOVIEW LTD  06/2012   Small area of basal inferior infarct with no ischemia. Normal EF  .  Stress Myoview  2013   normal findings  . TRANSTHORACIC ECHOCARDIOGRAM  February 2014    EF 55%. Mild-moderate MR   Family History  Problem Relation Age of Onset  . Stroke Mother   . Liver cancer Father   . Diabetes Father   . Esophageal cancer Brother   . Cerebral aneurysm Daughter   . Melanoma Daughter   . Cerebral aneurysm Son    Allergies  Allergen Reactions  . Niaspan [Niacin Er] Rash  . Penicillins Rash    Current Outpatient Medications:  .  APPLE CIDER VINEGAR PO, Take by mouth., Disp: , Rfl:  .  cholecalciferol (VITAMIN D) 1000 UNITS tablet, Take 1,000 Units by mouth daily., Disp: , Rfl:  .  clopidogrel (PLAVIX) 75 MG tablet, Take 1 tablet (75 mg total) by mouth daily., Disp: 180 tablet, Rfl: 3 .  metoprolol succinate (TOPROL-XL) 25 MG 24 hr tablet, Take 0.5 tablets (12.5 mg total) by mouth daily., Disp: 45 tablet, Rfl: 3 .  Multiple Vitamin (MULTIVITAMIN) tablet, Take 1 tablet by mouth daily., Disp: , Rfl:  .  Multiple Vitamins-Minerals (ICAPS AREDS 2 PO), Take 1 capsule by mouth daily., Disp: , Rfl:  .  naproxen sodium (ANAPROX) 220 MG tablet, Take 220 mg  by mouth daily as needed. , Disp: , Rfl:  .  nitroGLYCERIN (NITROSTAT) 0.4 MG SL tablet, Place 1 tablet (0.4 mg total) under the tongue every 5 (five) minutes as needed for chest pain., Disp: 25 tablet, Rfl: 3 .  ONE TOUCH ULTRA TEST test strip, USE AS DIRECTED ONCE DAILY, Disp: 100 each, Rfl: 4 .  ONETOUCH DELICA LANCETS FINE MISC, One finger stick daily for fasting blood sugar check., Disp: 100 each, Rfl: 3 .  pantoprazole (PROTONIX) 40 MG tablet, TAKE ONE (1) TABLET BY MOUTH DAILY ON MONDAY, WEDNESDAY, & FRIDAY OF EACH WEEK, Disp: 12 tablet, Rfl: 3 .  ramipril (ALTACE) 5 MG capsule, TAKE 1 CAPSULE BY MOUTH EVERY DAY, Disp: 128 capsule, Rfl: 0 .  INVOKANA 100 MG TABS tablet, TAKE 1 TABLET BY MOUTH DAILY BEFORE BREAKFAST (Patient not taking: Reported on 11/24/2018), Disp: 90 tablet, Rfl: 1 .  Omega-3 Fatty Acids (FISH  OIL) 1000 MG CAPS, Take 1,000 mg by mouth daily., Disp: , Rfl:  .  rosuvastatin (CRESTOR) 10 MG tablet, Take 1 tablet (10 mg total) by mouth daily., Disp: 90 tablet, Rfl: 3  Review of Systems  Constitutional: Negative for fever.  HENT: Positive for congestion, postnasal drip, rhinorrhea, sinus pressure, sinus pain, sneezing and sore throat. Negative for ear pain.   Respiratory: Positive for cough. Negative for chest tightness, shortness of breath and wheezing.   Cardiovascular: Negative for chest pain, palpitations and leg swelling.  Gastrointestinal: Negative for vomiting.  Neurological: Negative for headaches.   Social History   Tobacco Use  . Smoking status: Never Smoker  . Smokeless tobacco: Never Used  Substance Use Topics  . Alcohol use: No    Alcohol/week: 0.0 standard drinks     Objective:   BP (!) 148/60 (BP Location: Right Arm, Patient Position: Sitting, Cuff Size: Large)   Pulse 68   Temp 98.3 F (36.8 C) (Oral)   Resp 16   Wt 178 lb 3.2 oz (80.8 kg)   SpO2 96%   BMI 27.91 kg/m  Vitals:   01/02/19 1408  BP: (!) 148/60  Pulse: 68  Resp: 16  Temp: 98.3 F (36.8 C)  TempSrc: Oral  SpO2: 96%  Weight: 178 lb 3.2 oz (80.8 kg)   Physical Exam Constitutional:      General: He is not in acute distress.    Appearance: He is well-developed.  HENT:     Head: Normocephalic and atraumatic.     Right Ear: Hearing and tympanic membrane normal.     Left Ear: Hearing and tympanic membrane normal.     Nose: Nose normal.     Mouth/Throat:     Pharynx: Oropharynx is clear.  Eyes:     General: Lids are normal. No scleral icterus.       Right eye: No discharge.        Left eye: No discharge.     Conjunctiva/sclera: Conjunctivae normal.  Neck:     Musculoskeletal: Normal range of motion and neck supple.  Cardiovascular:     Rate and Rhythm: Normal rate and regular rhythm.     Heart sounds: Normal heart sounds.  Pulmonary:     Effort: Pulmonary effort is normal. No  respiratory distress.     Breath sounds: Normal breath sounds.  Abdominal:     General: Bowel sounds are normal.     Palpations: Abdomen is soft.  Musculoskeletal: Normal range of motion.  Skin:    Findings: No lesion or rash.  Neurological:  Mental Status: He is alert and oriented to person, place, and time.  Psychiatric:        Speech: Speech normal.        Behavior: Behavior normal.        Thought Content: Thought content normal.       Assessment & Plan    1. URI with cough and congestion Onset over the past 4 days with cough at night, rhinorrhea, scratchy throat and poor transillumination of maxillary sinuses. No documented fever or sinus tenderness. Some slight chills occasionally. Not much relief from the plain Mucinex. Increase fluid intake and with history of chronic diseases (CAD with DM) will give Z-pak to start if purulent discharge or fever develops over the weekend. Given Promethazine-VC for cough and congestion. May use Flonase Nasal Spray at bedtime. Recheck if no better in 5-7 days. - promethazine-phenylephrine (PROMETHAZINE VC) 6.25-5 MG/5ML SYRP; Take 5 mLs by mouth every 6 (six) hours as needed for congestion.  Dispense: 118 mL; Refill: 0 - azithromycin (ZITHROMAX) 250 MG tablet; Take 2 tablets by mouth the first day then one daily for 4 days.  Dispense: 6 tablet; Refill: [place], PA  [facility]

## 2019-01-02 NOTE — Telephone Encounter (Signed)
[facility] called to let Charles Sellers know the promethazine-phenylephrine (PROMETHAZINE VC) 6.25-5 MG/5ML SYRP is no longer made.  Asking if a substitute could be called in for pt asap.  Please advise.  Thanks, [employer]

## 2019-02-16 ENCOUNTER — Encounter: Payer: Self-pay | Admitting: Family Medicine

## 2019-02-16 ENCOUNTER — Ambulatory Visit (INDEPENDENT_AMBULATORY_CARE_PROVIDER_SITE_OTHER): Payer: PPO | Admitting: Family Medicine

## 2019-02-16 VITALS — BP 120/60 | HR 64 | Temp 97.7°F | Resp 16 | Wt 178.0 lb

## 2019-02-16 DIAGNOSIS — E1122 Type 2 diabetes mellitus with diabetic chronic kidney disease: Secondary | ICD-10-CM

## 2019-02-16 DIAGNOSIS — N181 Chronic kidney disease, stage 1: Secondary | ICD-10-CM

## 2019-02-16 DIAGNOSIS — I10 Essential (primary) hypertension: Secondary | ICD-10-CM

## 2019-02-16 DIAGNOSIS — E785 Hyperlipidemia, unspecified: Secondary | ICD-10-CM | POA: Diagnosis not present

## 2019-02-16 LAB — POCT GLYCOSYLATED HEMOGLOBIN (HGB A1C)
Est. average glucose Bld gHb Est-mCnc: 160
Hemoglobin A1C: 7.2 % — AB (ref 4.0–5.6)

## 2019-02-16 NOTE — Progress Notes (Signed)
Patient: Charles Sellers. Male    DOB: 05-08-1932   85 y.o.   MRN: [id number] Visit Date: 02/16/2019  Today's Provider: [name], PA   Chief Complaint  Patient presents with  . Follow-up  . Diabetes  . Hypertension  . Hyperlipidemia   Subjective:     HPI    Diabetes Mellitus Type II, Follow-up:   Lab Results  Component Value Date   HGBA1C 6.8 (H) 11/10/2018   HGBA1C 7.2 (A) 07/21/2018   HGBA1C 7.6 (H) 04/04/2018   Last seen for diabetes 3 months ago.  Management since then includes; labs checked, no changes. He reports good compliance with treatment. He is not having side effects. none Current symptoms include none and have been unchanged. Home blood sugar records: fasting range: 131-162  Episodes of hypoglycemia? no   Current Insulin Regimen: n/a Most Recent Eye Exam: 07/16/18 Weight trend: stable Prior visit with dietician: no Current diet: diabetic Current exercise: none  -------------------------------------------------------------------   Hypertension, follow-up:  BP Readings from Last 3 Encounters:  02/16/19 120/60  01/02/19 (!) 148/60  11/24/18 (!) 151/74    He was last seen for hypertension 3 months ago.  BP at that visit was 164/70. Management since that visit includes; labs checked, no changes .He reports good compliance with treatment. He is not having side effects. none He is not exercising. He is adherent to low salt diet.   Outside blood pressures are not checking. He is experiencing none.  Patient denies none.   Cardiovascular risk factors include advanced age (older than 74 for men, 1 for women) and diabetes mellitus.  Use of agents associated with hypertension: none.   -------------------------------------------------------------------    Lipid/Cholesterol, Follow-up:   Last seen for this 3 months ago.  Management since that visit includes; labs checked, no changes .  Last Lipid Panel:    Component Value  Date/Time   CHOL 128 11/10/2018 1209   TRIG 58 11/10/2018 1209   HDL 41 11/10/2018 1209   CHOLHDL 3.1 11/10/2018 1209   CHOLHDL 3.2 06/06/2008 2020   VLDL 6 06/06/2008 2020   LDLCALC 75 11/10/2018 1209    He reports good compliance with treatment. He is not having side effects. none  Wt Readings from Last 3 Encounters:  02/16/19 178 lb (80.7 kg)  01/02/19 178 lb 3.2 oz (80.8 kg)  11/24/18 173 lb (78.5 kg)    -------------------------------------------------------------------   Past Medical History:  Diagnosis Date  . Atherosclerosis   . CAD (coronary artery disease) 2011 & 2009   Echo, EF =>55% 2011/ Echo, EF =>55% 2009  . CAD S/P percutaneous coronary angioplasty 12/13 & 15 2005   PCI - mRCA (Cypher DES 3.5 mm x 18 mm --> 4.45mm); RPL Cypher 2.5 mm x 13 mm;; mLAD Cy a pher DES 3.0 mm x 13 mm (3.5 mm) with Cutting PTCA of D2 ostium;; mid Cx 100% after small OM1.  . Cholecystitis   . Dermatitis   . Diabetes mellitus type II, controlled ([place])    Diet controlled. No medications; he indicates "borderline diabetes"  . Dyslipidemia, goal LDL below 70     on statin  . Hypertension   . Leaky heart valve   . Prostate nodule   . Ventral hernia    Past Surgical History:  Procedure Laterality Date  . APPENDECTOMY    . CARDIAC CATHETERIZATION  2005  . Carotid Dopplers  September 2014   Bilateral internal carotids < 49%.  Marland Kitchen  CORONARY ANGIOPLASTY WITH STENT PLACEMENT     mRCA Cypher DES 3.5 mm x 18 mm (4.0), RPL Cypher DES 2.5 mm x 13 mm; mLAD 3.0 mm x 13 mm Cypher -> cutting PTCA of jailed D2; previousl PCI of Cx - 100% mid Cx.  Marland Kitchen NM MYOVIEW LTD  06/2012   Small area of basal inferior infarct with no ischemia. Normal EF  . Stress Myoview  2013   normal findings  . TRANSTHORACIC ECHOCARDIOGRAM  February 2014    EF 55%. Mild-moderate MR   Family History  Problem Relation Age of Onset  . Stroke Mother   . Liver cancer Father   . Diabetes Father   . Esophageal cancer Brother     . Cerebral aneurysm Daughter   . Melanoma Daughter   . Cerebral aneurysm Son    Allergies  Allergen Reactions  . Niaspan [Niacin Er] Rash  . Penicillins Rash    Current Outpatient Medications:  .  APPLE CIDER VINEGAR PO, Take by mouth., Disp: , Rfl:  .  cholecalciferol (VITAMIN D) 1000 UNITS tablet, Take 1,000 Units by mouth daily., Disp: , Rfl:  .  clopidogrel (PLAVIX) 75 MG tablet, Take 1 tablet (75 mg total) by mouth daily., Disp: 180 tablet, Rfl: 3 .  INVOKANA 100 MG TABS tablet, TAKE 1 TABLET BY MOUTH DAILY BEFORE BREAKFAST, Disp: 90 tablet, Rfl: 1 .  metoprolol succinate (TOPROL-XL) 25 MG 24 hr tablet, Take 0.5 tablets (12.5 mg total) by mouth daily., Disp: 45 tablet, Rfl: 3 .  Multiple Vitamin (MULTIVITAMIN) tablet, Take 1 tablet by mouth daily., Disp: , Rfl:  .  Multiple Vitamins-Minerals (ICAPS AREDS 2 PO), Take 1 capsule by mouth daily., Disp: , Rfl:  .  naproxen sodium (ANAPROX) 220 MG tablet, Take 220 mg by mouth daily as needed. , Disp: , Rfl:  .  nitroGLYCERIN (NITROSTAT) 0.4 MG SL tablet, Place 1 tablet (0.4 mg total) under the tongue every 5 (five) minutes as needed for chest pain., Disp: 25 tablet, Rfl: 3 .  Omega-3 Fatty Acids (FISH OIL) 1000 MG CAPS, Take 1,000 mg by mouth daily., Disp: , Rfl:  .  ONE TOUCH ULTRA TEST test strip, USE AS DIRECTED ONCE DAILY, Disp: 100 each, Rfl: 4 .  ONETOUCH DELICA LANCETS FINE MISC, One finger stick daily for fasting blood sugar check., Disp: 100 each, Rfl: 3 .  pantoprazole (PROTONIX) 40 MG tablet, TAKE ONE (1) TABLET BY MOUTH DAILY ON MONDAY, WEDNESDAY, & FRIDAY OF EACH WEEK, Disp: 12 tablet, Rfl: 3 .  ramipril (ALTACE) 5 MG capsule, TAKE 1 CAPSULE BY MOUTH EVERY DAY, Disp: 128 capsule, Rfl: 0 .  rosuvastatin (CRESTOR) 10 MG tablet, Take 1 tablet (10 mg total) by mouth daily., Disp: 90 tablet, Rfl: 3 .  azithromycin (ZITHROMAX) 250 MG tablet, Take 2 tablets by mouth the first day then one daily for 4 days. (Patient not taking:  Reported on 02/16/2019), Disp: 6 tablet, Rfl: 0 .  brompheniramine-pseudoephedrine-DM 30-2-10 MG/5ML syrup, Take 5 mLs by mouth 4 (four) times daily as needed. (Patient not taking: Reported on 02/16/2019), Disp: 120 mL, Rfl: 0  Review of Systems  Constitutional: Negative for appetite change, chills and fever.  Respiratory: Negative for chest tightness, shortness of breath and wheezing.   Cardiovascular: Negative for chest pain and palpitations.  Gastrointestinal: Negative for abdominal pain, nausea and vomiting.   Social History   Tobacco Use  . Smoking status: Never Smoker  . Smokeless tobacco: Never Used  Substance Use Topics  .  Alcohol use: No    Alcohol/week: 0.0 standard drinks     Objective:   BP 120/60 (BP Location: Right Arm, Patient Position: Sitting, Cuff Size: Large)   Pulse 64   Temp 97.7 F (36.5 C) (Oral)   Resp 16   Wt 178 lb (80.7 kg)   SpO2 95%   BMI 27.88 kg/m  Vitals:   02/16/19 0916  BP: 120/60  Pulse: 64  Resp: 16  Temp: 97.7 F (36.5 C)  TempSrc: Oral  SpO2: 95%  Weight: 178 lb (80.7 kg)   Physical Exam Constitutional:      General: He is not in acute distress.    Appearance: He is well-developed.  HENT:     Head: Normocephalic and atraumatic.     Right Ear: Hearing and tympanic membrane normal.     Left Ear: Hearing and tympanic membrane normal.     Nose: Nose normal.  Eyes:     General: Lids are normal. No scleral icterus.       Right eye: No discharge.        Left eye: No discharge.     Conjunctiva/sclera: Conjunctivae normal.  Cardiovascular:     Rate and Rhythm: Normal rate and regular rhythm.     Pulses: Normal pulses.     Heart sounds: Normal heart sounds.  Pulmonary:     Effort: Pulmonary effort is normal. No respiratory distress.  Abdominal:     General: Bowel sounds are normal.     Palpations: Abdomen is soft.  Musculoskeletal: Normal range of motion.  Skin:    Findings: No lesion or rash.  Neurological:     Mental Status:  He is alert and oriented to person, place, and time.  Psychiatric:        Speech: Speech normal.        Behavior: Behavior normal.        Thought Content: Thought content normal.    Diabetic Foot Form - Detailed   Diabetic Foot Exam - detailed Diabetic Foot exam was performed with the following findings:  Yes 02/16/2019 10:14 AM  Visual Foot Exam completed.:  Yes  Can the patient see the bottom of their feet?:  Yes Are the shoes appropriate in style and fit?:  Yes Is there swelling or and abnormal foot shape?:  No Is there a claw toe deformity?:  No Is there elevated skin temparature?:  No Is there foot or ankle muscle weakness?:  No Normal Range of Motion:  Yes Pulse Foot Exam completed.:  Yes  Right posterior Tibialias:  Present Left posterior Tibialias:  Present  Right Dorsalis Pedis:  Present Left Dorsalis Pedis:  Present  Sensory Foot Exam Completed.:  Yes Semmes-Weinstein Monofilament Test R Site 1-Great Toe:  Pos L Site 1-Great Toe:  Pos    Comments:  All toenails thickening without discomfort. Some varicose veins but no calluses or ulcerations.        Assessment & Plan    1. Controlled type 2 diabetes mellitus with stage 1 chronic kidney disease, without long-term current use of insulin (HCC) FBS in the 131-162 range recently. No vision changes, peripheral neuropathy or polydipsia. 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 Hgb A1C 7.2% today. Continues to take Invokana 100 mg qd before breakfast without hypoglycemic episodes. Must get back on diabetic diet and work on 5 lbs weight loss. Encouraged to continue ophthalmology exam annually and consider podiatry follow up for onychomycosis of nails. Recheck routine labs and follow up pending reports. -  POCT glycosylated hemoglobin (Hb A1C) - CBC with Differential/Platelet - Comprehensive metabolic panel - Lipid panel  2. Essential hypertension Much better control than last check in Dec. 2019. Continues to take Metoprolol  Succinate 12.5 mg qd and Ramipril 5 mg qd. No chest pains, palpitations, dyspnea or edema. Recheck routine labs and follow up pending reports. - CBC with Differential/Platelet - Comprehensive metabolic panel  3. Dyslipidemia, goal LDL below 70 Tolerating Crestor 10 mg qd without side effects. Trying to get back on diabetic low fat diet. Will check CMP and Lipid Panel. - Comprehensive metabolic panel - Lipid panel     [name], PA  [facility]

## 2019-02-17 ENCOUNTER — Telehealth: Payer: Self-pay

## 2019-02-17 LAB — CBC WITH DIFFERENTIAL/PLATELET
BASOS: 1 %
Basophils Absolute: 0 10*3/uL (ref 0.0–0.2)
EOS (ABSOLUTE): 0.2 10*3/uL (ref 0.0–0.4)
Eos: 2 %
Hematocrit: 43.5 % (ref 37.5–51.0)
Hemoglobin: 15.1 g/dL (ref 13.0–17.7)
Immature Grans (Abs): 0 10*3/uL (ref 0.0–0.1)
Immature Granulocytes: 0 %
Lymphocytes Absolute: 1.5 10*3/uL (ref 0.7–3.1)
Lymphs: 23 %
MCH: 30.7 pg (ref 26.6–33.0)
MCHC: 34.7 g/dL (ref 31.5–35.7)
MCV: 88 fL (ref 79–97)
Monocytes Absolute: 0.7 10*3/uL (ref 0.1–0.9)
Monocytes: 11 %
NEUTROS PCT: 63 %
Neutrophils Absolute: 4 10*3/uL (ref 1.4–7.0)
PLATELETS: 167 10*3/uL (ref 150–450)
RBC: 4.92 x10E6/uL (ref 4.14–5.80)
RDW: 12.8 % (ref 11.6–15.4)
WBC: 6.4 10*3/uL (ref 3.4–10.8)

## 2019-02-17 LAB — LIPID PANEL
Chol/HDL Ratio: 3 ratio (ref 0.0–5.0)
Cholesterol, Total: 127 mg/dL (ref 100–199)
HDL: 42 mg/dL (ref >39)
LDL Calculated: 71 mg/dL (ref 0–99)
Triglycerides: 69 mg/dL (ref 0–149)
VLDL Cholesterol Cal: 14 mg/dL (ref 5–40)

## 2019-02-17 LAB — COMPREHENSIVE METABOLIC PANEL
A/G RATIO: 1.9 (ref 1.2–2.2)
ALT: 20 IU/L (ref 0–44)
AST: 20 IU/L (ref 0–40)
Albumin: 4.6 g/dL (ref 3.6–4.6)
Alkaline Phosphatase: 64 IU/L (ref 39–117)
BUN/Creatinine Ratio: 29 — ABNORMAL HIGH (ref 10–24)
BUN: 20 mg/dL (ref 8–27)
Bilirubin Total: 0.9 mg/dL (ref 0.0–1.2)
CO2: 21 mmol/L (ref 20–29)
Calcium: 9 mg/dL (ref 8.6–10.2)
Chloride: 101 mmol/L (ref 96–106)
Creatinine, Ser: 0.68 mg/dL — ABNORMAL LOW (ref 0.76–1.27)
GFR calc Af Amer: 101 mL/min/{1.73_m2} (ref >59)
GFR calc non Af Amer: 88 mL/min/{1.73_m2} (ref >59)
Globulin, Total: 2.4 g/dL (ref 1.5–4.5)
Glucose: 116 mg/dL — ABNORMAL HIGH (ref 65–99)
POTASSIUM: 4.3 mmol/L (ref 3.5–5.2)
Sodium: 139 mmol/L (ref 134–144)
Total Protein: 7 g/dL (ref 6.0–8.5)

## 2019-02-17 NOTE — Telephone Encounter (Signed)
-----   Message from Margo Common, [place] sent at 02/17/2019  8:28 AM EDT ----- All blood tests essentially normal with minor variations. Continue present medications and recheck appointment with labs in 3 months.

## 2019-02-17 NOTE — Telephone Encounter (Signed)
Patient advised as directed below. Scheduled patient 05/21/19 at 9:00am

## 2019-02-17 NOTE — Telephone Encounter (Signed)
LM with Shirlee Limerick patient's wife, pt to call back.

## 2019-04-22 ENCOUNTER — Other Ambulatory Visit: Payer: Self-pay | Admitting: Family Medicine

## 2019-04-22 DIAGNOSIS — E118 Type 2 diabetes mellitus with unspecified complications: Secondary | ICD-10-CM

## 2019-05-13 ENCOUNTER — Other Ambulatory Visit: Payer: Self-pay | Admitting: Cardiology

## 2019-05-15 ENCOUNTER — Other Ambulatory Visit: Payer: Self-pay

## 2019-05-15 ENCOUNTER — Encounter: Payer: Self-pay | Admitting: Family Medicine

## 2019-05-15 ENCOUNTER — Ambulatory Visit (INDEPENDENT_AMBULATORY_CARE_PROVIDER_SITE_OTHER): Payer: PPO | Admitting: Family Medicine

## 2019-05-15 VITALS — BP 135/63 | HR 62 | Temp 98.0°F | Resp 16 | Ht 67.5 in | Wt 178.0 lb

## 2019-05-15 DIAGNOSIS — W57XXXA Bitten or stung by nonvenomous insect and other nonvenomous arthropods, initial encounter: Secondary | ICD-10-CM | POA: Diagnosis not present

## 2019-05-15 DIAGNOSIS — I10 Essential (primary) hypertension: Secondary | ICD-10-CM

## 2019-05-15 DIAGNOSIS — N181 Chronic kidney disease, stage 1: Secondary | ICD-10-CM | POA: Diagnosis not present

## 2019-05-15 DIAGNOSIS — S30860A Insect bite (nonvenomous) of lower back and pelvis, initial encounter: Secondary | ICD-10-CM | POA: Diagnosis not present

## 2019-05-15 DIAGNOSIS — I2510 Atherosclerotic heart disease of native coronary artery without angina pectoris: Secondary | ICD-10-CM

## 2019-05-15 DIAGNOSIS — Z9861 Coronary angioplasty status: Secondary | ICD-10-CM

## 2019-05-15 DIAGNOSIS — E1122 Type 2 diabetes mellitus with diabetic chronic kidney disease: Secondary | ICD-10-CM | POA: Diagnosis not present

## 2019-05-15 MED ORDER — DOXYCYCLINE HYCLATE 100 MG PO TABS: 100.0000 mg | ORAL_TABLET | Freq: Two times a day (BID) | ORAL | 0 refills | Status: DC

## 2019-05-15 NOTE — Progress Notes (Signed)
Patient: Charles Sellers. Male    DOB: 1932-06-19   85 y.o.   MRN: [id number] Visit Date: 05/15/2019  Today's Provider: [name], PA   Chief Complaint  Patient presents with  . Tick Removal   Subjective:   HPI Patient comes in today c/o a tick bite. He reports that he removed a tick off of his right hip yesterday. He reports that it has been there for at least 4 days. He initially thought that it was a mole. He has had Lyme disease before in the past. He denies fevers. He is beginning to feel achy today, but does not know if he could be developing tick illness or the muscle pain is coming from working in the yard this past week. He has not taken anything OTC for his symptoms.   Past Medical History:  Diagnosis Date  . Atherosclerosis   . CAD (coronary artery disease) 2011 & 2009   Echo, EF =>55% 2011/ Echo, EF =>55% 2009  . CAD S/P percutaneous coronary angioplasty 12/13 & 15 2005   PCI - mRCA (Cypher DES 3.5 mm x 18 mm --> 4.5mm); RPL Cypher 2.5 mm x 13 mm;; mLAD Cy a pher DES 3.0 mm x 13 mm (3.5 mm) with Cutting PTCA of D2 ostium;; mid Cx 100% after small OM1.  . Cholecystitis   . Dermatitis   . Diabetes mellitus type II, controlled ([place])    Diet controlled. No medications; he indicates "borderline diabetes"  . Dyslipidemia, goal LDL below 70     on statin  . Hypertension   . Leaky heart valve   . Prostate nodule   . Ventral hernia    Past Surgical History:  Procedure Laterality Date  . APPENDECTOMY    . CARDIAC CATHETERIZATION  2005  . Carotid Dopplers  September 2014   Bilateral internal carotids < 49%.  . CORONARY ANGIOPLASTY WITH STENT PLACEMENT     mRCA Cypher DES 3.5 mm x 18 mm (4.0), RPL Cypher DES 2.5 mm x 13 mm; mLAD 3.0 mm x 13 mm Cypher -> cutting PTCA of jailed D2; previousl PCI of Cx - 100% mid Cx.  Marland Kitchen NM MYOVIEW LTD  06/2012   Small area of basal inferior infarct with no ischemia. Normal EF  . Stress Myoview  2013   normal findings  .  TRANSTHORACIC ECHOCARDIOGRAM  February 2014    EF 55%. Mild-moderate MR   Family History  Problem Relation Age of Onset  . Stroke Mother   . Liver cancer Father   . Diabetes Father   . Esophageal cancer Brother   . Cerebral aneurysm Daughter   . Melanoma Daughter   . Cerebral aneurysm Son    Allergies  Allergen Reactions  . Niaspan [Niacin Er] Rash  . Penicillins Rash    Current Outpatient Medications:  .  cholecalciferol (VITAMIN D) 1000 UNITS tablet, Take 1,000 Units by mouth daily., Disp: , Rfl:  .  clopidogrel (PLAVIX) 75 MG tablet, Take 1 tablet (75 mg total) by mouth daily., Disp: 180 tablet, Rfl: 3 .  INVOKANA 100 MG TABS tablet, TAKE 1 TABLET BY MOUTH DAILY BEFORE BREAKFAST, Disp: 90 tablet, Rfl: 1 .  metoprolol succinate (TOPROL-XL) 25 MG 24 hr tablet, Take 0.5 tablets (12.5 mg total) by mouth daily., Disp: 45 tablet, Rfl: 3 .  Multiple Vitamin (MULTIVITAMIN) tablet, Take 1 tablet by mouth daily., Disp: , Rfl:  .  Multiple Vitamins-Minerals (ICAPS AREDS 2 PO), Take  1 capsule by mouth daily., Disp: , Rfl:  .  naproxen sodium (ANAPROX) 220 MG tablet, Take 220 mg by mouth daily as needed. , Disp: , Rfl:  .  nitroGLYCERIN (NITROSTAT) 0.4 MG SL tablet, Place 1 tablet (0.4 mg total) under the tongue every 5 (five) minutes as needed for chest pain., Disp: 25 tablet, Rfl: 3 .  Omega-3 Fatty Acids (FISH OIL) 1000 MG CAPS, Take 1,000 mg by mouth daily., Disp: , Rfl:  .  ONE TOUCH ULTRA TEST test strip, USE AS DIRECTED ONCE DAILY, Disp: 100 each, Rfl: 4 .  ONETOUCH DELICA LANCETS FINE MISC, One finger stick daily for fasting blood sugar check., Disp: 100 each, Rfl: 3 .  pantoprazole (PROTONIX) 40 MG tablet, TAKE ONE TABLET BY MOUTH EVERY DAY ON MONDAY WEDNESDAY AND FRIDAY OF EACH WEEK, Disp: 12 tablet, Rfl: 3 .  ramipril (ALTACE) 5 MG capsule, TAKE 1 CAPSULE BY MOUTH EVERY DAY, Disp: 128 capsule, Rfl: 0 .  APPLE CIDER VINEGAR PO, Take by mouth., Disp: , Rfl:  .  rosuvastatin (CRESTOR)  10 MG tablet, Take 1 tablet (10 mg total) by mouth daily., Disp: 90 tablet, Rfl: 3  Review of Systems  Constitutional: Negative for activity change, appetite change, chills, diaphoresis, fatigue, fever and unexpected weight change.  Musculoskeletal: Positive for arthralgias and myalgias.  Skin: Negative for color change, pallor, rash and wound.  Neurological: Negative for numbness.  Hematological: Does not bruise/bleed easily.  Psychiatric/Behavioral: Negative for agitation. The patient is not nervous/anxious.     Social History   Tobacco Use  . Smoking status: Never Smoker  . Smokeless tobacco: Never Used  Substance Use Topics  . Alcohol use: No    Alcohol/week: 0.0 standard drinks     Objective:   BP 135/63 (BP Location: Right Arm, Patient Position: Sitting, Cuff Size: Normal)   Pulse 62   Temp 98 F (36.7 C)   Resp 16   Ht 5' 7.5" (1.715 m)   Wt 178 lb (80.7 kg)   BMI 27.47 kg/m  Vitals:   05/15/19 1407  BP: 135/63  Pulse: 62  Resp: 16  Temp: 98 F (36.7 C)  Weight: 178 lb (80.7 kg)  Height: 5' 7.5" (1.715 m)   Physical Exam Vitals signs and nursing note reviewed.  Constitutional:      General: He is not in acute distress.    Appearance: He is well-developed.  HENT:     Head: Normocephalic and atraumatic.     Right Ear: Hearing and tympanic membrane normal.     Left Ear: Hearing and tympanic membrane normal.     Nose: Nose normal.     Mouth/Throat:     Pharynx: Oropharynx is clear.  Eyes:     General: Lids are normal. No scleral icterus.       Right eye: No discharge.        Left eye: No discharge.     Conjunctiva/sclera: Conjunctivae normal.  Cardiovascular:     Rate and Rhythm: Normal rate and regular rhythm.     Heart sounds: Normal heart sounds.  Pulmonary:     Effort: Pulmonary effort is normal. No respiratory distress.     Breath sounds: Normal breath sounds.  Abdominal:     General: Bowel sounds are normal.     Palpations: Abdomen is soft.   Musculoskeletal: Normal range of motion.  Skin:    Findings: Rash present. No lesion.     Comments: 2 cm diameter erythematous lesion with  central scab where tick was removed. No sign of local lymphadenopathy  Neurological:     Mental Status: He is alert and oriented to person, place, and time.  Psychiatric:        Speech: Speech normal.        Behavior: Behavior normal.        Thought Content: Thought content normal.       Assessment & Plan    1. Tick bite of buttock, initial encounter Felt a "mole" on the right buttock 3-4 days ago but could not see it. Wife (has dementia) pulled it off yesterday and he saw it was a tick. The bite site has been a little itchy and bright red with a central scab (2 cm diameter redness). No significant pain and denies fever or myalgias. States he had Lyme's Disease 10 years ago and worries about having another infection. Will check CBC and treat with Doxycycline 100 mg BID for 10 days. - CBC with Differential/Platelet  2. Controlled type 2 diabetes mellitus with stage 1 chronic kidney disease, without long-term current use of insulin (HCC) FBS 127 this morning and 177 yesterday ("because I ate peach cobbler the night before"). Still taking Invokana 100 mg before breakfast and trying to follow a low fat diabetic diet. 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 Foot exam and ophthalmology exam up to date. Continue statin and ACE-I. Recheck labs and follow up pending reports. - CBC with Differential/Platelet - Hemoglobin A1c - Comprehensive metabolic panel  3. Essential hypertension Stable BP control. Tolerating Metoprolol Succinate 25 mg 1/2 tablet daily and Ramipril 5 mg qd without side effects. Recheck CBC and CMP. Follow up pending reports. - CBC with Differential/Platelet - Comprehensive metabolic panel  4. CAD S/P percutaneous coronary angioplasty Denies chest pains, palpitations, edema or dyspnea. No recent use of NTG SL. Followed by Dr. Ellyn Hack  (cardiologist) on 09-15-18 for history of 2 vessel PCI with Cypher DES in Dec. 2005. Echo February of 2014 showed EF 55% with mild to moderate MR. Normal stress myoview 2013.     [name], PA  [facility]

## 2019-05-16 LAB — CBC WITH DIFFERENTIAL/PLATELET
Basophils Absolute: 0.1 10*3/uL (ref 0.0–0.2)
Basos: 1 %
EOS (ABSOLUTE): 0.1 10*3/uL (ref 0.0–0.4)
Eos: 2 %
Hematocrit: 45.7 % (ref 37.5–51.0)
Hemoglobin: 15.6 g/dL (ref 13.0–17.7)
Immature Grans (Abs): 0 10*3/uL (ref 0.0–0.1)
Immature Granulocytes: 0 %
Lymphocytes Absolute: 1.4 10*3/uL (ref 0.7–3.1)
Lymphs: 24 %
MCH: 30.8 pg (ref 26.6–33.0)
MCHC: 34.1 g/dL (ref 31.5–35.7)
MCV: 90 fL (ref 79–97)
Monocytes Absolute: 0.6 10*3/uL (ref 0.1–0.9)
Monocytes: 11 %
Neutrophils Absolute: 3.7 10*3/uL (ref 1.4–7.0)
Neutrophils: 62 %
Platelets: 173 10*3/uL (ref 150–450)
RBC: 5.06 x10E6/uL (ref 4.14–5.80)
RDW: 12.3 % (ref 11.6–15.4)
WBC: 5.9 10*3/uL (ref 3.4–10.8)

## 2019-05-16 LAB — COMPREHENSIVE METABOLIC PANEL
ALT: 19 IU/L (ref 0–44)
AST: 19 IU/L (ref 0–40)
Albumin/Globulin Ratio: 1.5 (ref 1.2–2.2)
Albumin: 4.1 g/dL (ref 3.6–4.6)
Alkaline Phosphatase: 67 IU/L (ref 39–117)
BUN/Creatinine Ratio: 15 (ref 10–24)
BUN: 14 mg/dL (ref 8–27)
Bilirubin Total: 0.6 mg/dL (ref 0.0–1.2)
CO2: 21 mmol/L (ref 20–29)
Calcium: 8.7 mg/dL (ref 8.6–10.2)
Chloride: 106 mmol/L (ref 96–106)
Creatinine, Ser: 0.92 mg/dL (ref 0.76–1.27)
GFR calc Af Amer: 87 mL/min/{1.73_m2} (ref >59)
GFR calc non Af Amer: 76 mL/min/{1.73_m2} (ref >59)
Globulin, Total: 2.7 g/dL (ref 1.5–4.5)
Glucose: 171 mg/dL — ABNORMAL HIGH (ref 65–99)
Potassium: 4.2 mmol/L (ref 3.5–5.2)
Sodium: 143 mmol/L (ref 134–144)
Total Protein: 6.8 g/dL (ref 6.0–8.5)

## 2019-05-16 LAB — HEMOGLOBIN A1C
Est. average glucose Bld gHb Est-mCnc: 154 mg/dL
Hgb A1c MFr Bld: 7 % — ABNORMAL HIGH (ref 4.8–5.6)

## 2019-05-18 ENCOUNTER — Telehealth: Payer: Self-pay

## 2019-05-18 NOTE — Telephone Encounter (Signed)
-----   Message from Margo Common, [place] sent at 05/18/2019  8:12 AM EDT ----- Blood sugar high but Hgb A1C down to target of 7.0 or less. Continue present medications and watch diet. Recheck in 4 months.

## 2019-05-18 NOTE — Telephone Encounter (Signed)
LMTCB ED 

## 2019-05-18 NOTE — Telephone Encounter (Signed)
Advised 

## 2019-05-21 ENCOUNTER — Ambulatory Visit: Payer: Self-pay | Admitting: Family Medicine

## 2019-07-02 ENCOUNTER — Ambulatory Visit: Payer: PPO | Admitting: Podiatry

## 2019-07-02 ENCOUNTER — Other Ambulatory Visit: Payer: Self-pay

## 2019-07-06 ENCOUNTER — Ambulatory Visit: Payer: PPO | Admitting: Podiatry

## 2019-07-06 ENCOUNTER — Other Ambulatory Visit: Payer: Self-pay

## 2019-07-06 ENCOUNTER — Encounter: Payer: Self-pay | Admitting: Podiatry

## 2019-07-06 DIAGNOSIS — M79674 Pain in right toe(s): Secondary | ICD-10-CM

## 2019-07-06 DIAGNOSIS — M79675 Pain in left toe(s): Secondary | ICD-10-CM | POA: Diagnosis not present

## 2019-07-06 DIAGNOSIS — B351 Tinea unguium: Secondary | ICD-10-CM

## 2019-07-06 NOTE — Progress Notes (Signed)
Complaint:  Visit Type: Patient returns to my office for continued preventative foot care services. Complaint: Patient states" my nails have grown long and thick and become painful to walk and wear shoes" Patient has been diagnosed with DM with no foot complications. The patient presents for preventative foot care services. No changes to ROS  Podiatric Exam: Vascular: dorsalis pedis and posterior tibial pulses are palpable bilateral. Capillary return is immediate. Temperature gradient is WNL. Skin turgor WNL  Sensorium: Normal Semmes Weinstein monofilament test. Normal tactile sensation bilaterally. Nail Exam: Pt has thick disfigured discolored nails with subungual debris noted bilateral entire nail hallux through fifth toenails Ulcer Exam: There is no evidence of ulcer or pre-ulcerative changes or infection. Orthopedic Exam: Muscle tone and strength are WNL. No limitations in general ROM. No crepitus or effusions noted. Foot type and digits show no abnormalities. Bony prominences are unremarkable. Skin: No Porokeratosis. No infection or ulcers  Diagnosis:  Onychomycosis, , Pain in right toe, pain in left toes  Treatment & Plan Procedures and Treatment: Consent by patient was obtained for treatment procedures.   Debridement of mycotic and hypertrophic toenails, 1 through 5 bilateral and clearing of subungual debris. No ulceration, no infection noted.  Return Visit-Office Procedure: Patient instructed to return to the office for a follow up visit 3 months for continued evaluation and treatment.    Braheem Tomasik DPM 

## 2019-07-08 ENCOUNTER — Other Ambulatory Visit: Payer: Self-pay | Admitting: Cardiology

## 2019-07-22 ENCOUNTER — Other Ambulatory Visit: Payer: Self-pay | Admitting: Cardiology

## 2019-08-18 ENCOUNTER — Ambulatory Visit: Payer: PPO | Admitting: Family Medicine

## 2019-09-10 ENCOUNTER — Other Ambulatory Visit: Payer: Self-pay | Admitting: Family Medicine

## 2019-09-10 ENCOUNTER — Other Ambulatory Visit: Payer: Self-pay | Admitting: Cardiology

## 2019-09-10 DIAGNOSIS — E1121 Type 2 diabetes mellitus with diabetic nephropathy: Secondary | ICD-10-CM

## 2019-09-14 ENCOUNTER — Other Ambulatory Visit: Payer: Self-pay

## 2019-09-14 ENCOUNTER — Encounter: Payer: Self-pay | Admitting: Family Medicine

## 2019-09-14 ENCOUNTER — Ambulatory Visit (INDEPENDENT_AMBULATORY_CARE_PROVIDER_SITE_OTHER): Payer: PPO | Admitting: Family Medicine

## 2019-09-14 VITALS — BP 130/68 | HR 66 | Temp 97.3°F | Resp 16 | Wt 173.0 lb

## 2019-09-14 DIAGNOSIS — S30860A Insect bite (nonvenomous) of lower back and pelvis, initial encounter: Secondary | ICD-10-CM | POA: Diagnosis not present

## 2019-09-14 DIAGNOSIS — L089 Local infection of the skin and subcutaneous tissue, unspecified: Secondary | ICD-10-CM

## 2019-09-14 DIAGNOSIS — W57XXXA Bitten or stung by nonvenomous insect and other nonvenomous arthropods, initial encounter: Secondary | ICD-10-CM

## 2019-09-14 MED ORDER — DOXYCYCLINE HYCLATE 100 MG PO TABS: 100.0000 mg | ORAL_TABLET | Freq: Two times a day (BID) | ORAL | 0 refills | Status: DC

## 2019-09-14 NOTE — Patient Instructions (Signed)
Spider Bite Spider bites are not common. Most spider bites do not cause serious problems. There are only a few types of spider bites that can cause serious health problems. What are the causes? This condition is caused when you make contact with a spider in a way that traps the spider against your skin. What are the signs or symptoms? Some spider bites may cause symptoms within 1 hour after the bite. For other spider bites, it may take 1-2 days for symptoms to appear. Symptoms include:  A raised area that is red.  Redness and swelling around the area of the bite.  Pain in the area of the bite. A few types of spiders, such as the black widow spider or the brown recluse spider, can inject poison (venom) into a bite wound. This causes more serious symptoms. Symptoms of these bites vary, and may include:  Muscle cramps.  Feeling sick to your stomach (nauseous).  Throwing up (vomiting).  Pain in your belly (abdomen).  A fever.  A skin sore (lesion) that spreads. This can break into an open wound (skin ulcer).  Feeling light-headed or dizzy. How is this treated? Many spider bites do not need treatment. If needed, treatment may include:  Icing and keeping the bite area raised (elevated).  Taking or applying over-the-counter or prescription medicines to help with symptoms such as pain and itching.  Having a tetanus shot.  Taking antibiotic medicine. Follow these instructions at home: Medicines  Take or apply over-the-counter and prescription medicines only as told by your doctor.  If you were prescribed an antibiotic medicine, take it as told by your doctor. Do not stop using it even if you start to feel better. Managing pain and swelling   If told, put ice on the bite area. ? Put ice in a plastic bag. ? Place a towel between your skin and the bag. ? Leave the ice on for 20 minutes, 2-3 times a day.  Raise the bite area above the level of your heart while you are sitting or  lying down. General instructions   Do not scratch the bite area.  Keep the bite area clean and dry. Wash the bite area with soap and water each day as told by your doctor.  Keep all follow-up visits as told by your doctor. This is important. Contact a doctor if:  Your bite does not get better after 3 days.  Your bite turns black or purple.  Near the bite, you have more: ? Redness. ? Swelling. ? Pain. Get help right away if:  You get shortness of breath or chest pain.  You have fluid, blood, or pus coming from the bite area.  You have painful muscle cramps or sudden muscle tightening (spasms).  You have belly pain.  You feel sick to your stomach or you throw up.  You feel more tired or sleepy than normal. Summary  Spider bites are not common. When spider bites do happen, most do not cause serious health problems.  Take or apply all medicines only as told by your doctor.  Keep the bite area clean and dry. Wash the bite area with soap and water each day as told by your doctor.  Contact a doctor if you have more redness, swelling, or pain near the bite.  Get help right away if you get shortness of breath or chest pain. This information is not intended to replace advice given to you by your health care provider. Make sure you discuss any  questions you have with your health care provider. Document Released: 12/29/2010 Document Revised: 07/08/2018 Document Reviewed: 07/08/2018 Elsevier Patient Education  2020 [employer].

## 2019-09-14 NOTE — Progress Notes (Signed)
Charles Sellers.  MRN: [id number] DOB: March 09, 1932  Subjective:  HPI   The patient is an 85 year old male who presents with complaint of being bit by something over the weekend.  He states that he now has leg swelling from the site of the bite.  He is not certain what it was or when exactly it may have bit him.  He describes having a place on his right upper leg just below the buttocks.  He states that he thinks it needs to be opened in order for it to drain.  He is unable to see the spot and states his wife has dementia and is not really of help with checking it.       Patient Active Problem List   Diagnosis Date Noted  . Pain due to onychomycosis of toenails of both feet 07/06/2019  . Mild aortic stenosis 09/04/2017  . 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 Mitral regurgitation 07/30/2017  . History of prolonged Q-T interval on ECG 06/22/2015  . Prostate lump 06/22/2015  . 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 Diabetes mellitus, type 2 ([place]) 06/22/2015  . CAD S/P percutaneous coronary angioplasty   . Diabetes mellitus type II, controlled ([place])   . Dyslipidemia, goal LDL below 70   . Essential hypertension   . Arthropathia 02/17/2010  . Chondrocalcinosis of multiple sites 08/11/2008  . Acid reflux 08/11/2008  . Cannot sleep 07/23/2008  . UNSPECIFIED INFLAMMATORY POLYARTHROPATHY 07/20/2008  . MYOSITIS 07/20/2008  . FEVER UNSPECIFIED 07/20/2008  . EXANTHEM 07/20/2008  . Muscle ache 07/16/2008  . Disorder of sacrum 06/19/2008  . Thyrotoxicosis 06/19/2008    Past Medical History:  Diagnosis Date  . Atherosclerosis   . CAD (coronary artery disease) 2011 & 2009   Echo, EF =>55% 2011/ Echo, EF =>55% 2009  . CAD S/P percutaneous coronary angioplasty 12/13 & 15 2005   PCI - mRCA (Cypher DES 3.5 mm x 18 mm --> 4.33mm); RPL Cypher 2.5 mm x 13 mm;; mLAD Cy a pher DES 3.0 mm x 13 mm (3.5 mm) with Cutting PTCA of D2 ostium;; mid Cx 100% after small OM1.  . Cholecystitis   .  Dermatitis   . Diabetes mellitus type II, controlled ([place])    Diet controlled. No medications; he indicates "borderline diabetes"  . Dyslipidemia, goal LDL below 70     on statin  . Hypertension   . Leaky heart valve   . Prostate nodule   . Ventral hernia    Past Surgical History:  Procedure Laterality Date  . APPENDECTOMY    . CARDIAC CATHETERIZATION  2005  . Carotid Dopplers  September 2014   Bilateral internal carotids < 49%.  . CORONARY ANGIOPLASTY WITH STENT PLACEMENT     mRCA Cypher DES 3.5 mm x 18 mm (4.0), RPL Cypher DES 2.5 mm x 13 mm; mLAD 3.0 mm x 13 mm Cypher -> cutting PTCA of jailed D2; previousl PCI of Cx - 100% mid Cx.  Marland Kitchen NM MYOVIEW LTD  06/2012   Small area of basal inferior infarct with no ischemia. Normal EF  . Stress Myoview  2013   normal findings  . TRANSTHORACIC ECHOCARDIOGRAM  February 2014    EF 55%. Mild-moderate MR   Family History  Problem Relation Age of Onset  . Stroke Mother   . Liver cancer Father   . Diabetes Father   . Esophageal cancer Brother   . Cerebral aneurysm Daughter   . Melanoma Daughter   .  Cerebral aneurysm Son    Social History   Socioeconomic History  . Marital status: Married    Spouse name: Not on file  . Number of children: 4  . Years of education: Not on file  . Highest education level: High school graduate  Occupational History  . Occupation: retired    Comment: previously worked at [employer]  . Financial resource strain: Not hard at all  . Food insecurity    Worry: Never true    Inability: Never true  . Transportation needs    Medical: No    Non-medical: No  Tobacco Use  . Smoking status: Never Smoker  . Smokeless tobacco: Never Used  Substance and Sexual Activity  . Alcohol use: No    Alcohol/week: 0.0 standard drinks  . Drug use: No  . Sexual activity: Not on file  Lifestyle  . Physical activity    Days per week: 0 days    Minutes per session: 0 min  . Stress: Not at  all  Relationships  . Social Herbalist on phone: Patient refused    Gets together: Patient refused    Attends religious service: Patient refused    Active member of club or organization: Patient refused    Attends meetings of clubs or organizations: Patient refused    Relationship status: Patient refused  . Intimate partner violence    Fear of current or ex partner: Patient refused    Emotionally abused: Patient refused    Physically abused: Patient refused    Forced sexual activity: Patient refused  Other Topics Concern  . Not on file  Social History Narrative   Married father of 3, 9 grandchildren, now 69 great-grandchildren. 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   Using the toilet, but does not do routine exercise. Always on the go.   Never smoked. Does not drink alcohol.   Outpatient Encounter Medications as of 09/14/2019  Medication Sig  . APPLE CIDER VINEGAR PO Take by mouth.  . cholecalciferol (VITAMIN D) 1000 UNITS tablet Take 1,000 Units by mouth daily.  . clopidogrel (PLAVIX) 75 MG tablet Take 1 tablet (75 mg total) by mouth daily.  Marland Kitchen doxycycline (VIBRA-TABS) 100 MG tablet Take 1 tablet (100 mg total) by mouth 2 (two) times daily.  . INVOKANA 100 MG TABS tablet TAKE 1 TABLET BY MOUTH DAILY BEFORE BREAKFAST  . metoprolol succinate (TOPROL-XL) 25 MG 24 hr tablet Take 0.5 tablets (12.5 mg total) by mouth daily. NEED OV.  . Multiple Vitamin (MULTIVITAMIN) tablet Take 1 tablet by mouth daily.  . Multiple Vitamins-Minerals (ICAPS AREDS 2 PO) Take 1 capsule by mouth daily.  . naproxen sodium (ANAPROX) 220 MG tablet Take 220 mg by mouth daily as needed.   . nitroGLYCERIN (NITROSTAT) 0.4 MG SL tablet Place 1 tablet (0.4 mg total) under the tongue every 5 (five) minutes as needed for chest pain.  . Omega-3 Fatty Acids (FISH OIL) 1000 MG CAPS Take 1,000  mg by mouth daily.  Glory Rosebush DELICA LANCETS FINE MISC One finger stick daily for fasting blood sugar check.  Glory Rosebush ULTRA test strip USE EVERY DAY AS DIRECTED  . pantoprazole (PROTONIX) 40 MG tablet TAKE 1 TABLET BY MOUTH ON MONDAY, WEDNESDAY, FRIDAY OF EACH WEEK  . ramipril (ALTACE) 5 MG capsule TAKE 1 CAPSULE BY MOUTH DAILY  .  rosuvastatin (CRESTOR) 10 MG tablet Take 1 tablet (10 mg total) by mouth daily.  . [DISCONTINUED] ramipril (ALTACE) 5 MG capsule TAKE 1 CAPSULE BY MOUTH EVERY DAY   No facility-administered encounter medications on file as of 09/14/2019.    Allergies  Allergen Reactions  . Niaspan [Niacin Er] Rash  . Penicillins Rash   Review of Systems  Constitutional: Negative for chills, diaphoresis, fever and malaise/fatigue.  HENT: Negative for congestion, ear pain and sore throat.   Respiratory: Negative for cough and shortness of breath.   Cardiovascular: Negative for chest pain.  Gastrointestinal: Negative for abdominal pain and diarrhea.    Objective:  BP 130/68 (BP Location: Right Arm)   Pulse 66   Temp (!) 97.3 F (36.3 C)   Resp 16   Wt 173 lb (78.5 kg)   SpO2 96%   BMI 26.70 kg/m   Physical Exam  Constitutional: He is oriented to person, place, and time and well-developed, well-nourished, and in no distress.  HENT:  Head: Normocephalic.  Eyes: Conjunctivae are normal.  Neck: Neck supple.  Pulmonary/Chest: Effort normal.  Abdominal: Soft.  Musculoskeletal: Normal range of motion.  Neurological: He is alert and oriented to person, place, and time.  Skin: No rash noted. There is erythema.  5 cm area of erythema with a central 2 cm black area that is very tender to touch on the right lower buttock. No purulent drainage.  Psychiatric: Mood, affect and judgment normal.    Assessment and Plan :  1. Infected insect bite of buttock, initial encounter Tender large lesion on the right lower buttock that started 5 days ago. No fever or purulent  drainage. No significant pain initially and did not see any insect or spider. Will treat with warm baking soda soaks each evening and cover with Neosporin Ointment on a Band-Aid. Treat with antibiotic and recheck in 1 week to assess progress and be sure wound care referral not needed. - doxycycline (VIBRA-TABS) 100 MG tablet; Take 1 tablet (100 mg total) by mouth 2 (two) times daily.  Dispense: 20 tablet; Refill: 0

## 2019-09-16 DIAGNOSIS — M799 Soft tissue disorder, unspecified: Secondary | ICD-10-CM | POA: Diagnosis not present

## 2019-09-16 DIAGNOSIS — L02415 Cutaneous abscess of right lower limb: Secondary | ICD-10-CM | POA: Diagnosis not present

## 2019-09-16 DIAGNOSIS — I10 Essential (primary) hypertension: Secondary | ICD-10-CM | POA: Diagnosis not present

## 2019-09-16 DIAGNOSIS — I2510 Atherosclerotic heart disease of native coronary artery without angina pectoris: Secondary | ICD-10-CM | POA: Diagnosis not present

## 2019-09-16 DIAGNOSIS — L03115 Cellulitis of right lower limb: Secondary | ICD-10-CM | POA: Diagnosis not present

## 2019-09-16 DIAGNOSIS — E119 Type 2 diabetes mellitus without complications: Secondary | ICD-10-CM | POA: Diagnosis not present

## 2019-09-16 DIAGNOSIS — E785 Hyperlipidemia, unspecified: Secondary | ICD-10-CM | POA: Diagnosis not present

## 2019-09-21 ENCOUNTER — Other Ambulatory Visit: Payer: Self-pay

## 2019-09-21 ENCOUNTER — Ambulatory Visit (INDEPENDENT_AMBULATORY_CARE_PROVIDER_SITE_OTHER): Payer: PPO | Admitting: Family Medicine

## 2019-09-21 VITALS — BP 126/72 | HR 61 | Temp 96.9°F | Wt 171.0 lb

## 2019-09-21 DIAGNOSIS — S30860D Insect bite (nonvenomous) of lower back and pelvis, subsequent encounter: Secondary | ICD-10-CM | POA: Diagnosis not present

## 2019-09-21 DIAGNOSIS — W57XXXD Bitten or stung by nonvenomous insect and other nonvenomous arthropods, subsequent encounter: Secondary | ICD-10-CM | POA: Diagnosis not present

## 2019-09-21 DIAGNOSIS — L089 Local infection of the skin and subcutaneous tissue, unspecified: Secondary | ICD-10-CM | POA: Diagnosis not present

## 2019-09-21 NOTE — Progress Notes (Signed)
Charles Sellers.  MRN: [id number] DOB: 1932/12/10  Subjective:  HPI   Mr. Babbs is an 85 year old male who presents for follow up of what is believed to be a spider bite.  He came in last week with redness and tenderness at the site of the bite.  The was also an area that was blackened.  He was given Doxycycline and instructed to follow up for re-check.  Two days after our visit the patient the patient then had to be seen in the ER.  He had CT and it did not show any evidence of abscess or pocket of pus.  He was treated with Bactrim and Keflex and was instructed to finish both prescriptions.    Patient Active Problem List   Diagnosis Date Noted  . Pain due to onychomycosis of toenails of both feet 07/06/2019  . Mild aortic stenosis 09/04/2017  . 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 Mitral regurgitation 07/30/2017  . History of prolonged Q-T interval on ECG 06/22/2015  . Prostate lump 06/22/2015  . 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 Diabetes mellitus, type 2 ([place]) 06/22/2015  . CAD S/P percutaneous coronary angioplasty   . Diabetes mellitus type II, controlled ([place])   . Dyslipidemia, goal LDL below 70   . Essential hypertension   . Arthropathia 02/17/2010  . Chondrocalcinosis of multiple sites 08/11/2008  . Acid reflux 08/11/2008  . Cannot sleep 07/23/2008  . UNSPECIFIED INFLAMMATORY POLYARTHROPATHY 07/20/2008  . MYOSITIS 07/20/2008  . FEVER UNSPECIFIED 07/20/2008  . EXANTHEM 07/20/2008  . Muscle ache 07/16/2008  . Disorder of sacrum 06/19/2008  . Thyrotoxicosis 06/19/2008   Past Medical History:  Diagnosis Date  . Atherosclerosis   . CAD (coronary artery disease) 2011 & 2009   Echo, EF =>55% 2011/ Echo, EF =>55% 2009  . CAD S/P percutaneous coronary angioplasty 12/13 & 15 2005   PCI - mRCA (Cypher DES 3.5 mm x 18 mm --> 4.69mm); RPL Cypher 2.5 mm x 13 mm;; mLAD Cy a pher DES 3.0 mm x 13 mm (3.5 mm) with Cutting PTCA of D2 ostium;; mid Cx 100% after small OM1.  .  Cholecystitis   . Dermatitis   . Diabetes mellitus type II, controlled ([place])    Diet controlled. No medications; he indicates "borderline diabetes"  . Dyslipidemia, goal LDL below 70     on statin  . Hypertension   . Leaky heart valve   . Prostate nodule   . Ventral hernia    Family History  Problem Relation Age of Onset  . Stroke Mother   . Liver cancer Father   . Diabetes Father   . Esophageal cancer Brother   . Cerebral aneurysm Daughter   . Melanoma Daughter   . Cerebral aneurysm Son    Social History   Socioeconomic History  . Marital status: Married    Spouse name: Not on file  . Number of children: 4  . Years of education: Not on file  . Highest education level: High school graduate  Occupational History  . Occupation: retired    Comment: previously worked at [employer]  . Financial resource strain: Not hard at all  . Food insecurity    Worry: Never true    Inability: Never true  . Transportation needs    Medical: No    Non-medical: No  Tobacco Use  . Smoking status: Never Smoker  . Smokeless tobacco: Never Used  Substance and Sexual Activity  .  Alcohol use: No    Alcohol/week: 0.0 standard drinks  . Drug use: No  . Sexual activity: Not on file  Lifestyle  . Physical activity    Days per week: 0 days    Minutes per session: 0 min  . Stress: Not at all  Relationships  . Social Herbalist on phone: Patient refused    Gets together: Patient refused    Attends religious service: Patient refused    Active member of club or organization: Patient refused    Attends meetings of clubs or organizations: Patient refused    Relationship status: Patient refused  . Intimate partner violence    Fear of current or ex partner: Patient refused    Emotionally abused: Patient refused    Physically abused: Patient refused    Forced sexual activity: Patient refused  Other Topics Concern  . Not on file  Social History  Narrative   Married father of 3, 9 grandchildren, now 26 great-grandchildren. 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   Using the toilet, but does not do routine exercise. Always on the go.   Never smoked. Does not drink alcohol.    Outpatient Encounter Medications as of 09/21/2019  Medication Sig  . APPLE CIDER VINEGAR PO Take by mouth.  . cholecalciferol (VITAMIN D) 1000 UNITS tablet Take 1,000 Units by mouth daily.  . clopidogrel (PLAVIX) 75 MG tablet Take 1 tablet (75 mg total) by mouth daily.  Marland Kitchen doxycycline (VIBRA-TABS) 100 MG tablet Take 1 tablet (100 mg total) by mouth 2 (two) times daily.  . INVOKANA 100 MG TABS tablet TAKE 1 TABLET BY MOUTH DAILY BEFORE BREAKFAST  . metoprolol succinate (TOPROL-XL) 25 MG 24 hr tablet Take 0.5 tablets (12.5 mg total) by mouth daily. NEED OV.  . Multiple Vitamin (MULTIVITAMIN) tablet Take 1 tablet by mouth daily.  . Multiple Vitamins-Minerals (ICAPS AREDS 2 PO) Take 1 capsule by mouth daily.  . naproxen sodium (ANAPROX) 220 MG tablet Take 220 mg by mouth daily as needed.   . nitroGLYCERIN (NITROSTAT) 0.4 MG SL tablet Place 1 tablet (0.4 mg total) under the tongue every 5 (five) minutes as needed for chest pain.  . Omega-3 Fatty Acids (FISH OIL) 1000 MG CAPS Take 1,000 mg by mouth daily.  Glory Rosebush DELICA LANCETS FINE MISC One finger stick daily for fasting blood sugar check.  Glory Rosebush ULTRA test strip USE EVERY DAY AS DIRECTED  . pantoprazole (PROTONIX) 40 MG tablet TAKE 1 TABLET BY MOUTH ON MONDAY, WEDNESDAY, FRIDAY OF EACH WEEK  . ramipril (ALTACE) 5 MG capsule TAKE 1 CAPSULE BY MOUTH DAILY  . rosuvastatin (CRESTOR) 10 MG tablet Take 1 tablet (10 mg total) by mouth daily.   No facility-administered encounter medications on file as of 09/21/2019.     Allergies  Allergen Reactions  . Niaspan [Niacin Er] Rash  . Penicillins  Rash    Review of Systems  Constitutional: Negative for chills, diaphoresis, fever and malaise/fatigue.  HENT: Negative for congestion, ear pain, sinus pain and sore throat.   Respiratory: Negative for cough and shortness of breath.   Cardiovascular: Negative for chest pain.  Gastrointestinal: Negative for abdominal pain and diarrhea.  Neurological: Negative for headaches.    Objective:  BP 126/72 (BP Location: Right Arm, Patient Position: Sitting, Cuff Size: Normal)   Pulse 61   Temp Marland Kitchen)  96.9 F (36.1 C) (Skin)   Wt 171 lb (77.6 kg)   SpO2 97%   BMI 26.39 kg/m  Wt Readings from Last 3 Encounters:  09/21/19 171 lb (77.6 kg)  09/14/19 173 lb (78.5 kg)  05/15/19 178 lb (80.7 kg)   Physical Exam  Constitutional: He is oriented to person, place, and time and well-developed, well-nourished, and in no distress.  HENT:  Head: Normocephalic.  Eyes: Conjunctivae are normal.  Neck: Neck supple.  Pulmonary/Chest: Effort normal.  Abdominal: Soft.  Musculoskeletal: Normal range of motion.  Neurological: He is alert and oriented to person, place, and time.  Skin: No rash noted.  5 cm area of erythema fading with much less tenderness. Central 2 cm area is no longer black but brown with with border covered with Tegaderm. No drainage and no induration.  Psychiatric: Mood, affect and judgment normal.    Assessment and Plan :  1. Infected insect bite of buttock, subsequent encounter Had increase in discomfort and went to the [facility] ER on 09-16-19. CT scan negative for large abscess or deep space infection. No fever or chills and no leukocytosis on labs. Changed antibiotics (with negative blood cultures) to Keflex and Bactrim. Feeling much improved and able to sit on that buttock without much soreness remaining. No longer using the Doxycycline. Lesion no longer black and no induration. Finish the antibiotics and recheck progress in 10 days.

## 2019-09-23 ENCOUNTER — Encounter: Payer: Self-pay | Admitting: Cardiology

## 2019-09-23 ENCOUNTER — Ambulatory Visit: Payer: PPO | Admitting: Cardiology

## 2019-09-23 ENCOUNTER — Other Ambulatory Visit: Payer: Self-pay

## 2019-09-23 VITALS — BP 124/62 | HR 67 | Temp 97.1°F | Ht 68.0 in | Wt 167.0 lb

## 2019-09-23 DIAGNOSIS — I340 Nonrheumatic mitral (valve) insufficiency: Secondary | ICD-10-CM

## 2019-09-23 DIAGNOSIS — E785 Hyperlipidemia, unspecified: Secondary | ICD-10-CM

## 2019-09-23 DIAGNOSIS — I10 Essential (primary) hypertension: Secondary | ICD-10-CM | POA: Diagnosis not present

## 2019-09-23 DIAGNOSIS — N181 Chronic kidney disease, stage 1: Secondary | ICD-10-CM

## 2019-09-23 DIAGNOSIS — I350 Nonrheumatic aortic (valve) stenosis: Secondary | ICD-10-CM

## 2019-09-23 DIAGNOSIS — Z9861 Coronary angioplasty status: Secondary | ICD-10-CM | POA: Diagnosis not present

## 2019-09-23 DIAGNOSIS — I2510 Atherosclerotic heart disease of native coronary artery without angina pectoris: Secondary | ICD-10-CM

## 2019-09-23 DIAGNOSIS — E1122 Type 2 diabetes mellitus with diabetic chronic kidney disease: Secondary | ICD-10-CM | POA: Diagnosis not present

## 2019-09-23 NOTE — Patient Instructions (Addendum)
Medication Instructions:  NO CHANGES  If you need a refill on your cardiac medications before your next appointment, please call your pharmacy.   Lab work: NOT NEEDED  Testing/Procedures:  NOT NEEDED Follow-Up: At [facility], you and your health needs are our priority.  As part of our continuing mission to provide you with exceptional heart care, we have created designated Provider Care Teams.  These Care Teams include your primary Cardiologist (physician) and Advanced Practice Providers (APPs -  Physician Assistants and Nurse Practitioners) who all work together to provide you with the care you need, when you need it. . You will need a follow up appointment in     12  Months OCT 2021.  Please call our office 2 months in advance to schedule this appointment.  You may see Glenetta Hew, MD or one of the following Advanced Practice Providers on your designated Care Team:   . Rosaria Ferries, PA-C . Jory Sims, DNP, ANP  Any Other Special Instructions Will Be Listed Below (If Applicable).

## 2019-09-23 NOTE — Progress Notes (Signed)
PCP: Margo Common, PA  Clinic Note: Chief Complaint  Patient presents with  . Follow-up    12 months.  . Coronary Artery Disease    Distant history of PCI.  No angina    HPI:    Charles Sellers. is a 85 y.o. male with a PMH history of CAD-PCI below who presents today for annual follow-up.  Charles Sellers is a former patient of Dr. Terance Ice with CAD and moderate mitral regurgitation.  December 2005: Staged two-vessel DES PCI --> initial PCI of the mid RCA with a 3.5 x 18 mm Cypher DES (-> 4.0 mm),  RPL - Cypher DES 2.5 mm x 13 mm   Staged PCI of the LAD at D2 with a Cypher DES 3.0 mm 30 mm (-> 3.88mm) w/ provisional PTCA of D2 ostium 2/2 plaque shift.   CTO dCx ==> Med Rx.   Follow-up Myoviews (most recent June 2013) --> nonischemic.  Small basal inferior infarct.  TTE 08/07/2018: Normal LV function EF 55 to 60%.  No R WMA.  GR 1 DD.  Mild aortic stenosis with trivial regurgitation.  Moderate mitral regurgitation with mildly dilated left atrium.   Charles Sellers. was last seen in Oct 2019--was doing well.  In great spirits.  He spends several hours a day doing yard work and gardening.  Also cuts firewood with a chainsaw asked.  His wife is more frustrated about him overdoing it.  Recent Hospitalizations: None  Reviewed  CV studies:   The following studies were reviewed today: (if available, images/films reviewed: From Epic Chart or Care Everywhere) . None since Echo 07/2018:   Interval History:   Charles Sellers. returns here today for annual follow-up again doing very well.  He is still extremely active.  In great spirits.  He jokingly talks about all the gardening and mowing etc. that he does.  He still splits wood.  He actually pushes the mower and does the weed whacking.  Over the course of the last year he has been working light in his garage, rebuilt 3 tractors. He really has no major complaints.  No cardiac issues to speak of.  Certainly no angina or dyspnea  with rest or exertion despite having a diagnosis of mitral vegetation, he has no heart failure symptoms of PND, orthopnea or edema.  Cardiovascular review of symptoms (summary): no chest pain or dyspnea on exertion positive for - Sometimes he gets a little dizzy and lightheaded if he pushes too hard in the heat, probably because he does not drink enough. negative for - edema, irregular heartbeat, orthopnea, palpitations, paroxysmal nocturnal dyspnea, rapid heart rate, shortness of breath or Syncope/near syncope, TIA/amaurosis fugax.  Claudication.  The patient does not have symptoms concerning for COVID-19 infection (fever, chills, cough, or new shortness of breath).  The patient is practicing social distancing. +++ Masking.  ++ Groceries/shopping.     REVIEWED OF SYSTEMS   ROS: A comprehensive was performed. Review of Systems  Constitutional: Positive for weight loss (Gentle weight loss.  He is conscious of what he eats.). Negative for malaise/fatigue.  Respiratory: Negative for cough, shortness of breath and wheezing.   Gastrointestinal: Positive for constipation (Off and on, he has to be careful with what he eats.) and heartburn (If he does not take his PPI every other day, he will have significant GERD.). Negative for blood in stool and melena.  Genitourinary: Negative for hematuria.  Musculoskeletal: Negative for falls and joint pain (Just  minor aches and pains).       His right leg hurts where the bug bite is but otherwise no myalgias or significant arthritis  Skin:       Recently had a spider bite of some sort of his right thigh.  Was treated with IV antibiotics and then oral antibiotics.  Did not have I&D.  Neurological: Positive for dizziness (If he gets dehydrated). Negative for focal weakness.  Psychiatric/Behavioral: Negative for depression and memory loss. The patient has insomnia (Chronic). The patient is not nervous/anxious.   All other systems reviewed and are negative.  I  have reviewed and (if needed) personally updated the patient's problem list, medications, allergies, past medical and surgical history, social and family history.   PAST MEDICAL HISTORY   Past Medical History:  Diagnosis Date  . Atherosclerosis   . CAD (coronary artery disease) 2011 & 2009   Echo, EF =>55% 2011/ Echo, EF =>55% 2009  . CAD S/P percutaneous coronary angioplasty 12/13 & 15 2005   PCI - mRCA (Cypher DES 3.5 mm x 18 mm --> 4.63mm); RPL Cypher 2.5 mm x 13 mm;; mLAD Cy a pher DES 3.0 mm x 13 mm (3.5 mm) with Cutting PTCA of D2 ostium;; mid Cx 100% after small OM1.  . Cholecystitis   . Dermatitis   . Diabetes mellitus type II, controlled ([place])    Diet controlled. No medications; he indicates "borderline diabetes"  . Dyslipidemia, goal LDL below 70     on statin  . Hypertension   . Leaky heart valve   . Prostate nodule   . Ventral hernia     PAST SURGICAL HISTORY   Past Surgical History:  Procedure Laterality Date  . APPENDECTOMY    . CARDIAC CATHETERIZATION  2005  . Carotid Dopplers  September 2014   Bilateral internal carotids < 49%.  . CORONARY ANGIOPLASTY WITH STENT PLACEMENT     mRCA Cypher DES 3.5 mm x 18 mm (4.0), RPL Cypher DES 2.5 mm x 13 mm; mLAD 3.0 mm x 13 mm Cypher -> cutting PTCA of jailed D2; previousl PCI of Cx - 100% mid Cx.  Marland Kitchen NM MYOVIEW LTD  06/2012   Small area of basal inferior infarct with no ischemia. Normal EF  . Stress Myoview  2013   normal findings  . TRANSTHORACIC ECHOCARDIOGRAM  February 2014    EF 55%. Mild-moderate MR     MEDICATIONS/ALLERGIES   Current Meds  Medication Sig  . APPLE CIDER VINEGAR PO Take by mouth.  . cholecalciferol (VITAMIN D) 1000 UNITS tablet Take 1,000 Units by mouth daily.  . clopidogrel (PLAVIX) 75 MG tablet Take 1 tablet (75 mg total) by mouth daily.  . INVOKANA 100 MG TABS tablet TAKE 1 TABLET BY MOUTH DAILY BEFORE BREAKFAST  . metoprolol succinate (TOPROL-XL) 25 MG 24 hr tablet Take 0.5 tablets (12.5 mg  total) by mouth daily. NEED OV.  . Multiple Vitamin (MULTIVITAMIN) tablet Take 1 tablet by mouth daily.  . Multiple Vitamins-Minerals (ICAPS AREDS 2 PO) Take 1 capsule by mouth daily.  . naproxen sodium (ANAPROX) 220 MG tablet Take 220 mg by mouth daily as needed.   . nitroGLYCERIN (NITROSTAT) 0.4 MG SL tablet Place 1 tablet (0.4 mg total) under the tongue every 5 (five) minutes as needed for chest pain.  . Omega-3 Fatty Acids (FISH OIL) 1000 MG CAPS Take 1,000 mg by mouth daily.  Glory Rosebush DELICA LANCETS FINE MISC One finger stick daily for fasting blood sugar check.  Marland Kitchen  ONETOUCH ULTRA test strip USE EVERY DAY AS DIRECTED  . pantoprazole (PROTONIX) 40 MG tablet TAKE 1 TABLET BY MOUTH ON MONDAY, WEDNESDAY, FRIDAY OF EACH WEEK  . ramipril (ALTACE) 5 MG capsule TAKE 1 CAPSULE BY MOUTH DAILY  . rosuvastatin (CRESTOR) 10 MG tablet Take 1 tablet (10 mg total) by mouth daily.    Allergies  Allergen Reactions  . Niaspan [Niacin Er] Rash  . Penicillins Rash     SOCIAL HISTORY/FAMILY HISTORY   Social History   Tobacco Use  . Smoking status: Never Smoker  . Smokeless tobacco: Never Used  Substance Use Topics  . Alcohol use: No    Alcohol/week: 0.0 standard drinks  . Drug use: No   Social History   Social History Narrative   Married father of 3, 9 grandchildren, now 9 great-grandchildren. 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   Using the toilet, but does not do routine exercise. Always on the go.   Never smoked. Does not drink alcohol.    family history includes Cerebral aneurysm in his daughter and son; Diabetes in his father; Esophageal cancer in his brother; Liver cancer in his father; Melanoma in his daughter; Stroke in his mother.   OBJCTIVE -PE, EKG, labs   Wt Readings from Last 3 Encounters:  09/23/19 167 lb (75.8 kg)  09/21/19 171 lb (77.6 kg)   09/14/19 173 lb (78.5 kg)    Physical Exam: BP 124/62 (BP Location: Left Arm, Patient Position: Sitting, Cuff Size: Normal)   Pulse 67   Temp (!) 97.1 F (36.2 C)   Ht 5\' 8"  (1.727 m)   Wt 167 lb (75.8 kg)   BMI 25.39 kg/m  Physical Exam  Constitutional: He is oriented to person, place, and time. He appears well-developed and well-nourished. No distress.  Well-groomed.  Appears younger than stated age.  HENT:  Head: Normocephalic and atraumatic.  Neck: Normal range of motion. Neck supple. No hepatojugular reflux and no JVD present. Carotid bruit is not present.  Cardiovascular: Normal rate, intact distal pulses and normal pulses.  No extrasystoles are present. PMI is not displaced. Exam reveals no gallop and no friction rub.  Murmur heard. High-pitched blowing decrescendo holosystolic murmur is present with a grade of 1/6 at the apex. High-pitched harsh crescendo-decrescendo early systolic murmur of grade 1/6 is also present at the upper right sternal border. Pulmonary/Chest: Effort normal and breath sounds normal. No respiratory distress. He has no wheezes. He has no rales.  Abdominal: Soft. Bowel sounds are normal. He exhibits no distension. There is no abdominal tenderness. There is no rebound.  Musculoskeletal: Normal range of motion.        General: No edema.  Neurological: He is alert and oriented to person, place, and time.  Psychiatric: He has a normal mood and affect. His behavior is normal. Judgment and thought content normal.  Vitals reviewed.   Adult ECG Report  Rate: 67;  Rhythm: normal sinus rhythm and Left axis deviation.  RBBB.  Cannot exclude inferior MI, age undetermined.;   Narrative Interpretation: Stable EKG  Recent Labs: From PCP February 16, 2019: TC 127, TG 69, HDL 42, LDL 71.Cr 0.92, K+ 4.2.  A1c 7.0.  Hgb 15.6.   ASSESSMENT/PLAN    Problem List Items Addressed This Visit    Essential hypertension (Chronic)    Blood pressure looks great on current meds.  Note need to change from very low-dose Toprol and ramipril.      CAD S/P percutaneous coronary angioplasty - Primary (Chronic)    2 stents in the RCA system with one of the LAD.  Is really on Plavix for maintenance.--Okay to hold Plavix for surgeries/procedures at the discretion of the performing proceduralist.  Is on ACE inhibitor, beta-blocker and statin all at relatively low doses. Is also on Invokana as an SGLT2 inhibitor..      Relevant Orders   EKG 12-Lead (Completed)   Diabetes mellitus type II, controlled ([place]) (Chronic)    Has been started on Invokana which does provide cardioprotection.      Dyslipidemia, goal LDL below 70 (Chronic)    LDL went back down to roughly goal on 10 of Crestor.  He is back now for the 10 mg dosing.      Mitral regurgitation (Chronic)    Relatively soft murmur.  I think is probably still a mild-moderate stage.  Unless the murmur gets louder, I think we can hold off check an echocardiogram.      Relevant Orders   EKG 12-Lead (Completed)   Mild aortic stenosis (Chronic)   Relevant Orders   EKG 12-Lead (Completed)       COVID-19 Education: The signs and symptoms of COVID-19 were discussed with the patient and how to seek care for testing (follow up with PCP or arrange E-visit).   The importance of social distancing was discussed today.  I spent a total of 20minutes with the patient and chart review. >  50% of the time was spent in direct patient consultation.  Additional time spent with chart review (studies, outside notes, etc): 4 Total Time: 22 min  Current medicines are reviewed at length with the patient today.  (+/- concerns) n/a   Patient Instructions / Medication Changes & Studies & Tests Ordered   Patient Instructions  Medication Instructions:  NO CHANGES  If you need a refill on your cardiac medications before your next appointment, please call your pharmacy.   Lab work: NOT NEEDED  Testing/Procedures:  NOT NEEDED  Follow-Up: At [facility], you and your health needs are our priority.  As part of our continuing mission to provide you with exceptional heart care, we have created designated Provider Care Teams.  These Care Teams include your primary Cardiologist (physician) and Advanced Practice Providers (APPs -  Physician Assistants and Nurse Practitioners) who all work together to provide you with the care you need, when you need it. . You will need a follow up appointment in     12  Months OCT 2021.  Please call our office 2 months in advance to schedule this appointment.  You may see Glenetta Hew, MD or one of the following Advanced Practice Providers on your designated Care Team:   . Rosaria Ferries, PA-C . Jory Sims, DNP, ANP  Any Other Special Instructions Will Be Listed Below (If Applicable).    Studies Ordered:   Orders Placed This Encounter  Procedures  . EKG 12-Lead     Glenetta Hew, M.D., M.S. Interventional Cardiologist   Pager # [phone] Phone # [phone] [address]. [place], [place] [address]   Thank you for choosing [facility] at [facility]!!

## 2019-09-25 ENCOUNTER — Encounter: Payer: Self-pay | Admitting: Cardiology

## 2019-09-25 NOTE — Assessment & Plan Note (Signed)
Blood pressure looks great on current meds.  Note need to change from very low-dose Toprol and ramipril.

## 2019-09-25 NOTE — Assessment & Plan Note (Signed)
LDL went back down to roughly goal on 10 of Crestor.  He is back now for the 10 mg dosing.

## 2019-09-25 NOTE — Assessment & Plan Note (Signed)
2 stents in the RCA system with one of the LAD.  Is really on Plavix for maintenance.--Okay to hold Plavix for surgeries/procedures at the discretion of the performing proceduralist.  Is on ACE inhibitor, beta-blocker and statin all at relatively low doses. Is also on Invokana as an SGLT2 inhibitor.Marland Kitchen

## 2019-09-25 NOTE — Assessment & Plan Note (Signed)
Has been started on Invokana which does provide cardioprotection.

## 2019-09-25 NOTE — Assessment & Plan Note (Signed)
Relatively soft murmur.  I think is probably still a mild-moderate stage.  Unless the murmur gets louder, I think we can hold off check an echocardiogram.

## 2019-10-01 ENCOUNTER — Encounter: Payer: Self-pay | Admitting: Family Medicine

## 2019-10-01 ENCOUNTER — Other Ambulatory Visit: Payer: Self-pay

## 2019-10-01 ENCOUNTER — Ambulatory Visit (INDEPENDENT_AMBULATORY_CARE_PROVIDER_SITE_OTHER): Payer: PPO | Admitting: Family Medicine

## 2019-10-01 VITALS — BP 132/60 | HR 75 | Temp 96.8°F | Wt 170.0 lb

## 2019-10-01 DIAGNOSIS — L089 Local infection of the skin and subcutaneous tissue, unspecified: Secondary | ICD-10-CM | POA: Diagnosis not present

## 2019-10-01 DIAGNOSIS — N181 Chronic kidney disease, stage 1: Secondary | ICD-10-CM | POA: Diagnosis not present

## 2019-10-01 DIAGNOSIS — W57XXXD Bitten or stung by nonvenomous insect and other nonvenomous arthropods, subsequent encounter: Secondary | ICD-10-CM

## 2019-10-01 DIAGNOSIS — E1122 Type 2 diabetes mellitus with diabetic chronic kidney disease: Secondary | ICD-10-CM

## 2019-10-01 NOTE — Progress Notes (Signed)
Charles Sellers.  MRN: [id number] DOB: [birth year]  Subjective:  HPI   The patient is an 85 year old male who presents for follow up of a spider bite that he was last seen for on 09/21/19. The patient reports that the site of the bite is no longer sore and his wife told him it is healing well.   Patient Active Problem List   Diagnosis Date Noted  . Pain due to onychomycosis of toenails of both feet 07/06/2019  . Mild aortic stenosis 09/04/2017  . 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 Mitral regurgitation 07/30/2017  . History of prolonged Q-T interval on ECG 06/22/2015  . Prostate lump 06/22/2015  . 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 Diabetes mellitus, type 2 ([place]) 06/22/2015  . CAD S/P percutaneous coronary angioplasty   . Diabetes mellitus type II, controlled ([place])   . Dyslipidemia, goal LDL below 70   . Essential hypertension   . Arthropathia 02/17/2010  . Chondrocalcinosis of multiple sites 08/11/2008  . Acid reflux 08/11/2008  . Cannot sleep 07/23/2008  . UNSPECIFIED INFLAMMATORY POLYARTHROPATHY 07/20/2008  . MYOSITIS 07/20/2008  . FEVER UNSPECIFIED 07/20/2008  . EXANTHEM 07/20/2008  . Muscle ache 07/16/2008  . Disorder of sacrum 06/19/2008  . Thyrotoxicosis 06/19/2008    Past Medical History:  Diagnosis Date  . Atherosclerosis   . CAD (coronary artery disease) 2011 & 2009   Echo, EF =>55% 2011/ Echo, EF =>55% 2009  . CAD S/P percutaneous coronary angioplasty 12/13 & 15 2005   PCI - mRCA (Cypher DES 3.5 mm x 18 mm --> 4.55m); RPL Cypher 2.5 mm x 13 mm;; mLAD Cy a pher DES 3.0 mm x 13 mm (3.5 mm) with Cutting PTCA of D2 ostium;; mid Cx 100% after small OM1.  . Cholecystitis   . Dermatitis   . Diabetes mellitus type II, controlled ([place]    Diet controlled. No medications; he indicates "borderline diabetes"  . Dyslipidemia, goal LDL below 70     on statin  . Hypertension   . Leaky heart valve   . Prostate nodule   . Ventral hernia     Social History    Socioeconomic History  . Marital status: Married    Spouse name: Not on file  . Number of children: 4  . Years of education: Not on file  . Highest education level: High school graduate  Occupational History  . Occupation: retired    Comment: previously worked at [employer] . Financial resource strain: Not hard at all  . Food insecurity    Worry: Never true    Inability: Never true  . Transportation needs    Medical: No    Non-medical: No  Tobacco Use  . Smoking status: Never Smoker  . Smokeless tobacco: Never Used  Substance and Sexual Activity  . Alcohol use: No    Alcohol/week: 0.0 standard drinks  . Drug use: No  . Sexual activity: Not on file  Lifestyle  . Physical activity    Days per week: 0 days    Minutes per session: 0 min  . Stress: Not at all  Relationships  . Social [place] phone: Patient refused    Gets together: Patient refused    Attends religious service: Patient refused    Active member of club or organization: Patient refused    Attends meetings of clubs or organizations: Patient refused    Relationship status: Patient refused  .  Intimate partner violence    Fear of current or ex partner: Patient refused    Emotionally abused: Patient refused    Physically abused: Patient refused    Forced sexual activity: Patient refused  Other Topics Concern  . Not on file  Social History Narrative   Married father of 3, 9 grandchildren, now 65 great-grandchildren. 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   Using the toilet, but does not do routine exercise. Always on the go.   Never smoked. Does not drink alcohol.    Outpatient Encounter Medications as of 10/01/2019  Medication Sig  . APPLE CIDER VINEGAR PO Take by mouth.  . cholecalciferol (VITAMIN D) 1000 UNITS tablet Take 1,000 Units by  mouth daily.  . clopidogrel (PLAVIX) 75 MG tablet Take 1 tablet (75 mg total) by mouth daily.  . INVOKANA 100 MG TABS tablet TAKE 1 TABLET BY MOUTH DAILY BEFORE BREAKFAST  . metoprolol succinate (TOPROL-XL) 25 MG 24 hr tablet Take 0.5 tablets (12.5 mg total) by mouth daily. NEED OV.  . Multiple Vitamin (MULTIVITAMIN) tablet Take 1 tablet by mouth daily.  . Multiple Vitamins-Minerals (ICAPS AREDS 2 PO) Take 1 capsule by mouth daily.  . naproxen sodium (ANAPROX) 220 MG tablet Take 220 mg by mouth daily as needed.   . nitroGLYCERIN (NITROSTAT) 0.4 MG SL tablet Place 1 tablet (0.4 mg total) under the tongue every 5 (five) minutes as needed for chest pain.  . Omega-3 Fatty Acids (FISH OIL) 1000 MG CAPS Take 1,000 mg by mouth daily.  Glory Rosebush DELICA LANCETS FINE MISC One finger stick daily for fasting blood sugar check.  Glory Rosebush ULTRA test strip USE EVERY DAY AS DIRECTED  . pantoprazole (PROTONIX) 40 MG tablet TAKE 1 TABLET BY MOUTH ON MONDAY, WEDNESDAY, FRIDAY OF EACH WEEK  . ramipril (ALTACE) 5 MG capsule TAKE 1 CAPSULE BY MOUTH DAILY  . rosuvastatin (CRESTOR) 10 MG tablet Take 1 tablet (10 mg total) by mouth daily.   No facility-administered encounter medications on file as of 10/01/2019.     Allergies  Allergen Reactions  . Niaspan [Niacin Er] Rash  . Penicillins Rash    Review of Systems  Constitutional: Negative for chills, diaphoresis, fever and malaise/fatigue.  HENT: Negative for congestion, ear pain, sinus pain and sore throat.   Respiratory: Negative for cough and shortness of breath.   Cardiovascular: Negative for chest pain.  Gastrointestinal: Negative for abdominal pain and diarrhea.    Objective:  BP 132/60 (BP Location: Right Arm, Patient Position: Sitting, Cuff Size: Normal)   Pulse 75   Temp (!) 96.8 F (36 C) (Skin)   Wt 170 lb (77.1 kg)   SpO2 98%   BMI 25.85 kg/m   Physical Exam  Constitutional: He is oriented to person, place, and time and  well-developed, well-nourished, and in no distress.  HENT:  Head: Normocephalic.  Eyes: Conjunctivae are normal.  Neck: Neck supple.  Pulmonary/Chest: Effort normal.  Abdominal: Soft.  Musculoskeletal: Normal range of motion.  Neurological: He is alert and oriented to person, place, and time.  Skin: No rash noted.  2 cm shallow raw area on right upper thigh/lowr buttock and slight surrounding erythema. No drainage or tenderness.   Psychiatric: Mood, affect and judgment normal.    Assessment and Plan :  1. Infected insect bite of buttock, subsequent encounter Healing well with only mild  redness and very shallow raw area remaining. No further tenderness, scab or necrotic tissue. Has finished all his antibiotics. Recheck prn and continue Band-Aid dressing prn. Keep the area clean and dry.  2. Controlled type 2 diabetes mellitus with stage 1 chronic kidney disease, without long-term current use of insulin ([place]) Had spike of FBS to 175 yesterday and back down to 141. Normally in the 100-120 range. Remembers having a heavy meal of chicken pie yesterday and feels this is the cause. Has been on Invokana 100 mg qd and feeling well. Last labs of 09-16-19 at [facility] showed glucose of 146, creatinine 0.64 with EGFR 89. Will recheck hgb A1C. - Hemoglobin A1c

## 2019-10-02 ENCOUNTER — Telehealth: Payer: Self-pay | Admitting: *Deleted

## 2019-10-02 LAB — HEMOGLOBIN A1C
Est. average glucose Bld gHb Est-mCnc: 140 mg/dL
Hgb A1c MFr Bld: 6.5 % — ABNORMAL HIGH (ref 4.8–5.6)

## 2019-10-02 NOTE — Telephone Encounter (Signed)
Tried calling pt, no answer and no vm

## 2019-10-02 NOTE — Telephone Encounter (Signed)
-----   Message from Margo Common, [place] sent at 10/02/2019 12:00 PM EDT ----- Duplicate note.

## 2019-10-05 NOTE — Telephone Encounter (Signed)
Advised 

## 2019-10-05 NOTE — Telephone Encounter (Signed)
Pt returned call ° °Charles Sellers °

## 2019-10-20 ENCOUNTER — Ambulatory Visit (INDEPENDENT_AMBULATORY_CARE_PROVIDER_SITE_OTHER): Payer: PPO | Admitting: Family Medicine

## 2019-10-20 ENCOUNTER — Other Ambulatory Visit: Payer: Self-pay

## 2019-10-20 DIAGNOSIS — Z23 Encounter for immunization: Secondary | ICD-10-CM | POA: Diagnosis not present

## 2019-10-22 ENCOUNTER — Ambulatory Visit: Payer: PPO | Admitting: Family Medicine

## 2019-10-26 ENCOUNTER — Other Ambulatory Visit: Payer: Self-pay | Admitting: Cardiology

## 2019-10-30 ENCOUNTER — Ambulatory Visit: Payer: PPO

## 2019-11-04 ENCOUNTER — Other Ambulatory Visit: Payer: Self-pay | Admitting: Cardiology

## 2019-11-11 ENCOUNTER — Other Ambulatory Visit: Payer: Self-pay | Admitting: Family Medicine

## 2019-11-11 DIAGNOSIS — E118 Type 2 diabetes mellitus with unspecified complications: Secondary | ICD-10-CM

## 2019-11-16 ENCOUNTER — Encounter: Payer: Self-pay | Admitting: Family Medicine

## 2019-11-16 ENCOUNTER — Ambulatory Visit (INDEPENDENT_AMBULATORY_CARE_PROVIDER_SITE_OTHER): Payer: PPO | Admitting: Family Medicine

## 2019-11-16 ENCOUNTER — Other Ambulatory Visit: Payer: Self-pay

## 2019-11-16 VITALS — BP 142/72 | HR 71 | Temp 97.9°F | Wt 171.0 lb

## 2019-11-16 DIAGNOSIS — E1122 Type 2 diabetes mellitus with diabetic chronic kidney disease: Secondary | ICD-10-CM | POA: Diagnosis not present

## 2019-11-16 DIAGNOSIS — N181 Chronic kidney disease, stage 1: Secondary | ICD-10-CM | POA: Diagnosis not present

## 2019-11-16 MED ORDER — METFORMIN HCL 500 MG PO TABS: 500.0000 mg | ORAL_TABLET | Freq: Every day | ORAL | 3 refills | Status: DC

## 2019-11-16 NOTE — Progress Notes (Signed)
Charles Sellers.  MRN: [id number] DOB: Nov 28, 1932  Subjective:  HPI   The patient is an 85 year old who presents to have a discussion about his Invokana use.  The patient is concerned about the cost of this medicine. He states that he had been paying $100 for 90 pills, he now has to pay $76 for 30 pills.His last A1C was on 10/01/19 and it was 6.5.  Patient Active Problem List   Diagnosis Date Noted  . Pain due to onychomycosis of toenails of both feet 07/06/2019  . Mild aortic stenosis 09/04/2017  . 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 Mitral regurgitation 07/30/2017  . History of prolonged Q-T interval on ECG 06/22/2015  . Prostate lump 06/22/2015  . 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 Diabetes mellitus, type 2 ([place]) 06/22/2015  . CAD S/P percutaneous coronary angioplasty   . Diabetes mellitus type II, controlled ([place])   . Dyslipidemia, goal LDL below 70   . Essential hypertension   . Arthropathia 02/17/2010  . Chondrocalcinosis of multiple sites 08/11/2008  . Acid reflux 08/11/2008  . Cannot sleep 07/23/2008  . UNSPECIFIED INFLAMMATORY POLYARTHROPATHY 07/20/2008  . MYOSITIS 07/20/2008  . FEVER UNSPECIFIED 07/20/2008  . EXANTHEM 07/20/2008  . Muscle ache 07/16/2008  . Disorder of sacrum 06/19/2008  . Thyrotoxicosis 06/19/2008    Past Medical History:  Diagnosis Date  . Atherosclerosis   . CAD (coronary artery disease) 2011 & 2009   Echo, EF =>55% 2011/ Echo, EF =>55% 2009  . CAD S/P percutaneous coronary angioplasty 12/13 & 15 2005   PCI - mRCA (Cypher DES 3.5 mm x 18 mm --> 4.40mm); RPL Cypher 2.5 mm x 13 mm;; mLAD Cy a pher DES 3.0 mm x 13 mm (3.5 mm) with Cutting PTCA of D2 ostium;; mid Cx 100% after small OM1.  . Cholecystitis   . Dermatitis   . Diabetes mellitus type II, controlled ([place])    Diet controlled. No medications; he indicates "borderline diabetes"  . Dyslipidemia, goal LDL below 70     on statin  . Hypertension   . Leaky heart valve   .  Prostate nodule   . Ventral hernia     Social History   Socioeconomic History  . Marital status: Married    Spouse name: Not on file  . Number of children: 4  . Years of education: Not on file  . Highest education level: High school graduate  Occupational History  . Occupation: retired    Comment: previously worked at [employer]  . Financial resource strain: Not hard at all  . Food insecurity    Worry: Never true    Inability: Never true  . Transportation needs    Medical: No    Non-medical: No  Tobacco Use  . Smoking status: Never Smoker  . Smokeless tobacco: Never Used  Substance and Sexual Activity  . Alcohol use: No    Alcohol/week: 0.0 standard drinks  . Drug use: No  . Sexual activity: Not on file  Lifestyle  . Physical activity    Days per week: 0 days    Minutes per session: 0 min  . Stress: Not at all  Relationships  . Social Herbalist on phone: Patient refused    Gets together: Patient refused    Attends religious service: Patient refused    Active member of club or organization: Patient refused    Attends meetings of clubs or organizations:  Patient refused    Relationship status: Patient refused  . Intimate partner violence    Fear of current or ex partner: Patient refused    Emotionally abused: Patient refused    Physically abused: Patient refused    Forced sexual activity: Patient refused  Other Topics Concern  . Not on file  Social History Narrative   Married father of 3, 9 grandchildren, now 1 great-grandchildren. 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   Using the toilet, but does not do routine exercise. Always on the go.   Never smoked. Does not drink alcohol.    Outpatient Encounter Medications as of 11/16/2019  Medication Sig  . APPLE CIDER VINEGAR PO Take by mouth.  .  cholecalciferol (VITAMIN D) 1000 UNITS tablet Take 1,000 Units by mouth daily.  . clopidogrel (PLAVIX) 75 MG tablet TAKE 1 TABLET BY MOUTH DAILY  . INVOKANA 100 MG TABS tablet TAKE 1 TABLET BY MOUTH DAILY BEFORE BREAKFAST  . metoprolol succinate (TOPROL-XL) 25 MG 24 hr tablet Take 0.5 tablets (12.5 mg total) by mouth daily. NEED OV.  . Multiple Vitamin (MULTIVITAMIN) tablet Take 1 tablet by mouth daily.  . Multiple Vitamins-Minerals (ICAPS AREDS 2 PO) Take 1 capsule by mouth daily.  . naproxen sodium (ANAPROX) 220 MG tablet Take 220 mg by mouth daily as needed.   . nitroGLYCERIN (NITROSTAT) 0.4 MG SL tablet Place 1 tablet (0.4 mg total) under the tongue every 5 (five) minutes as needed for chest pain.  . Omega-3 Fatty Acids (FISH OIL) 1000 MG CAPS Take 1,000 mg by mouth daily.  Glory Rosebush DELICA LANCETS FINE MISC One finger stick daily for fasting blood sugar check.  Glory Rosebush ULTRA test strip USE EVERY DAY AS DIRECTED  . pantoprazole (PROTONIX) 40 MG tablet TAKE 1 TABLET BY MOUTH ON MONDAY, WEDNESDAY, FRIDAY OF EACH WEEK  . ramipril (ALTACE) 5 MG capsule TAKE 1 CAPSULE BY MOUTH DAILY  . rosuvastatin (CRESTOR) 10 MG tablet TAKE 1 TABLET BY MOUTH DAILY   No facility-administered encounter medications on file as of 11/16/2019.     Allergies  Allergen Reactions  . Niaspan [Niacin Er] Rash  . Penicillins Rash    Review of Systems  Constitutional: Negative for chills, diaphoresis, fever and malaise/fatigue.  HENT: Negative for congestion, ear pain, sinus pain and sore throat.   Respiratory: Negative for cough and wheezing.   Cardiovascular: Negative for chest pain.  Gastrointestinal: Negative for abdominal pain and diarrhea.  Musculoskeletal: Negative for myalgias.  Neurological: Negative for headaches.    Objective:  BP (!) 142/72 (BP Location: Right Arm, Patient Position: Sitting, Cuff Size: Normal)   Pulse 71   Temp 97.9 F (36.6 C) (Skin)   Wt 171 lb (77.6 kg)   SpO2 99%   BMI  26.00 kg/m   Physical Exam  Constitutional: He is oriented to person, place, and time and well-developed, well-nourished, and in no distress.  HENT:  Head: Normocephalic.  Eyes: Conjunctivae are normal.  Neck: Neck supple.  Cardiovascular: Normal rate and regular rhythm.  Pulmonary/Chest: Effort normal and breath sounds normal.  Abdominal: Soft. Bowel sounds are normal.  Musculoskeletal: Normal range of motion.  Neurological: He is alert and oriented to person, place, and time.  Skin: No rash noted.  Psychiatric: Mood, affect and judgment normal.    Assessment and Plan :  1. Controlled type 2 diabetes  mellitus with stage 1 chronic kidney disease, without long-term current use of insulin ([facility]) FBS in the 120-140 range recently and last Hgb A1C was 6.5% on 10-01-19. Had no problem taking the Invokana but found his insurance increased his cost to $76 for 30 tablets this month when it use to be $100 for 90 tablets. Requests going back on the Metformin 500 mg qd. Will schedule follow up and fasting labs around 01-17-20. - metFORMIN (GLUCOPHAGE) 500 MG tablet; Take 1 tablet (500 mg total) by mouth daily with breakfast.  Dispense: 90 tablet; Refill: 3

## 2019-11-16 NOTE — Progress Notes (Addendum)
Subjective:   Charles Sellers. is a 85 y.o. male who presents for Medicare Annual/Subsequent preventive examination.    This visit is being conducted through telemedicine due to the COVID-19 pandemic. This patient has given me verbal consent via doximity to conduct this visit, patient states they are participating from their home address. Some vital signs may be absent or patient reported.    Patient identification: identified by name, DOB, and current address  Review of Systems:  N/A  Cardiac Risk Factors include: advanced age (>54men, >20 women);dyslipidemia;hypertension;diabetes mellitus;male gender     Objective:    Vitals: There were no vitals taken for this visit.  There is no height or weight on file to calculate BMI. Unable to obtain vitals due to visit being conducted via telephonically.   Advanced Directives 11/17/2019 10/24/2018 10/18/2017 08/03/2015 07/26/2015  Does Patient Have a Medical Advance Directive? Yes Yes Yes Yes Yes  Type of Advance Directive Living will;Healthcare Power of [place];Living will [place];Living will Living will;Healthcare Power of Attorney Living will  Does patient want to make changes to medical advance directive? - - - No - Patient declined -  Copy of [place] in Chart? No - copy requested No - copy requested No - copy requested - -    Tobacco Social History   Tobacco Use  Smoking Status Never Smoker  Smokeless Tobacco Never Used     Counseling given: Not Answered   Clinical Intake:  Pre-visit preparation completed: Yes  Pain : No/denies pain Pain Score: 0-No pain     Nutritional Risks: None Diabetes: Yes  How often do you need to have someone help you when you read instructions, pamphlets, or other written materials from your doctor or pharmacy?: 1 - Never   Diabetes:  Is the patient diabetic?  Yes type 2 If diabetic, was a CBG obtained today?  No  Did  the patient bring in their glucometer from home?  No  How often do you monitor your CBG's? Once a day.   Financial Strains and Diabetes Management:  Are you having any financial strains with the device, your supplies or your medication? No .  Does the patient want to be seen by Chronic Care Management for management of their diabetes?  No  Would the patient like to be referred to a Nutritionist or for Diabetic Management?  No   Diabetic Exams:  Diabetic Eye Exam: Completed 07/16/18. Overdue for diabetic eye exam. Pt has been advised about the importance in completing this exam. Apt scheduled 11/18/19.  Diabetic Foot Exam: Completed 02/16/19. Repeat yearly.   Interpreter Needed?: No  Information entered by :: [facility], LPN  Past Medical History:  Diagnosis Date  . Atherosclerosis   . CAD (coronary artery disease) 2011 & 2009   Echo, EF =>55% 2011/ Echo, EF =>55% 2009  . CAD S/P percutaneous coronary angioplasty 12/13 & 15 2005   PCI - mRCA (Cypher DES 3.5 mm x 18 mm --> 4.72mm); RPL Cypher 2.5 mm x 13 mm;; mLAD Cy a pher DES 3.0 mm x 13 mm (3.5 mm) with Cutting PTCA of D2 ostium;; mid Cx 100% after small OM1.  . Cholecystitis   . Dermatitis   . Diabetes mellitus type II, controlled ([place])    Diet controlled. No medications; he indicates "borderline diabetes"  . Dyslipidemia, goal LDL below 70     on statin  . Hypertension   . Leaky heart valve   .  Prostate nodule   . Ventral hernia    Past Surgical History:  Procedure Laterality Date  . APPENDECTOMY    . CARDIAC CATHETERIZATION  2005  . Carotid Dopplers  September 2014   Bilateral internal carotids < 49%.  . CORONARY ANGIOPLASTY WITH STENT PLACEMENT     mRCA Cypher DES 3.5 mm x 18 mm (4.0), RPL Cypher DES 2.5 mm x 13 mm; mLAD 3.0 mm x 13 mm Cypher -> cutting PTCA of jailed D2; previousl PCI of Cx - 100% mid Cx.  Marland Kitchen NM MYOVIEW LTD  06/2012   Small area of basal inferior infarct with no ischemia. Normal EF  . Stress Myoview  2013    normal findings  . TRANSTHORACIC ECHOCARDIOGRAM  February 2014    EF 55%. Mild-moderate MR   Family History  Problem Relation Age of Onset  . Stroke Mother   . Liver cancer Father   . Diabetes Father   . Esophageal cancer Brother   . Cerebral aneurysm Daughter   . Melanoma Daughter   . Cerebral aneurysm Son    Social History   Socioeconomic History  . Marital status: Married    Spouse name: Not on file  . Number of children: 4  . Years of education: Not on file  . Highest education level: High school graduate  Occupational History  . Occupation: retired    Comment: previously worked at [employer]  . Financial resource strain: Not hard at all  . Food insecurity    Worry: Never true    Inability: Never true  . Transportation needs    Medical: No    Non-medical: No  Tobacco Use  . Smoking status: Never Smoker  . Smokeless tobacco: Never Used  Substance and Sexual Activity  . Alcohol use: No    Alcohol/week: 0.0 standard drinks  . Drug use: No  . Sexual activity: Not on file  Lifestyle  . Physical activity    Days per week: 0 days    Minutes per session: 0 min  . Stress: Not at all  Relationships  . Social Herbalist on phone: Patient refused    Gets together: Patient refused    Attends religious service: Patient refused    Active member of club or organization: Patient refused    Attends meetings of clubs or organizations: Patient refused    Relationship status: Patient refused  Other Topics Concern  . Not on file  Social History Narrative   Married father of 3, 9 grandchildren, now 39 great-grandchildren. 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   Using the toilet, but does not do routine exercise. Always on the go.   Never smoked. Does not drink alcohol.    Outpatient Encounter Medications as of  11/17/2019  Medication Sig  . APPLE CIDER VINEGAR PO Take by mouth daily.   . cholecalciferol (VITAMIN D) 1000 UNITS tablet Take 1,000 Units by mouth daily.  . clopidogrel (PLAVIX) 75 MG tablet TAKE 1 TABLET BY MOUTH DAILY  . metFORMIN (GLUCOPHAGE) 500 MG tablet Take 1 tablet (500 mg total) by mouth daily with breakfast.  . metoprolol succinate (TOPROL-XL) 25 MG 24 hr tablet Take 0.5 tablets (12.5 mg total) by mouth daily. NEED OV.  . Multiple Vitamin (MULTIVITAMIN) tablet Take 1 tablet by mouth daily.  . Multiple Vitamins-Minerals (ICAPS AREDS  2 PO) Take 1 capsule by mouth daily.  . naproxen sodium (ANAPROX) 220 MG tablet Take 220 mg by mouth daily as needed.   . nitroGLYCERIN (NITROSTAT) 0.4 MG SL tablet Place 1 tablet (0.4 mg total) under the tongue every 5 (five) minutes as needed for chest pain.  . Omega-3 Fatty Acids (FISH OIL) 1000 MG CAPS Take 1,000 mg by mouth daily.  Glory Rosebush DELICA LANCETS FINE MISC One finger stick daily for fasting blood sugar check.  Glory Rosebush ULTRA test strip USE EVERY DAY AS DIRECTED  . pantoprazole (PROTONIX) 40 MG tablet TAKE 1 TABLET BY MOUTH ON MONDAY, WEDNESDAY, FRIDAY OF EACH WEEK  . ramipril (ALTACE) 5 MG capsule TAKE 1 CAPSULE BY MOUTH DAILY  . rosuvastatin (CRESTOR) 10 MG tablet TAKE 1 TABLET BY MOUTH DAILY   No facility-administered encounter medications on file as of 11/17/2019.     Activities of Daily Living In your present state of health, do you have any difficulty performing the following activities: 11/17/2019  Hearing? N  Vision? N  Difficulty concentrating or making decisions? N  Walking or climbing stairs? N  Dressing or bathing? N  Doing errands, shopping? N  Preparing Food and eating ? N  Using the Toilet? N  In the past six months, have you accidently leaked urine? N  Do you have problems with loss of bowel control? N  Managing your Medications? N  Managing your Finances? N  Housekeeping or managing your Housekeeping? N  Some  recent data might be hidden    Patient Care Team: Chrismon, Vickki Muff, PA as PCP - General (Physician Assistant) Leonie Man, MD as PCP - Cardiology (Cardiology) Birder Robson, MD as Referring Physician (Ophthalmology) Birder Robson, MD as Referring Physician (Ophthalmology)   Assessment:   This is a routine wellness examination for Jubal.  Exercise Activities and Dietary recommendations Current Exercise Habits: The patient does not participate in regular exercise at present, Exercise limited by: None identified  Goals    . DIET - INCREASE LEAN PROTEINS     Recommend to cut out the fried fatty meats and start eating more baked or broiled chicken instead.     . Increase water intake     Recommend increasing water intake to 6-8 glasses a day.        Fall Risk Fall Risk  11/17/2019 10/24/2018 10/18/2017 06/18/2017 01/09/2016  Falls in the past year? 0 0 No No No  Number falls in past yr: 0 - - - -  Injury with Fall? 0 - - - -   FALL RISK PREVENTION PERTAINING TO THE HOME:  Any stairs in or around the home? Yes  If so, are there any without handrails? No   Home free of loose throw rugs in walkways, pet beds, electrical cords, etc? Yes  Adequate lighting in your home to reduce risk of falls? Yes   ASSISTIVE DEVICES UTILIZED TO PREVENT FALLS:  Life alert? No  Use of a cane, walker or w/c? No  Grab bars in the bathroom? Yes  Shower chair or bench in shower? Yes  Elevated toilet seat or a handicapped toilet? Yes    TIMED UP AND GO:  Was the test performed? No .    Depression Screen PHQ 2/9 Scores 11/17/2019 10/24/2018 10/18/2017 06/18/2017  PHQ - 2 Score 0 3 0 0  PHQ- 9 Score - 3 - 0    Cognitive Function: Declined today.         Immunization History  Administered Date(s) Administered  . Fluad Quad(high Dose 65+) 10/20/2019  . Influenza, High Dose Seasonal PF 10/18/2017, 10/24/2018  . Pneumococcal Conjugate-13 01/09/2016  . Pneumococcal  Polysaccharide-23 08/04/2008  . Tdap 01/09/2016  . Zoster Recombinat (Shingrix) 11/04/2019    Qualifies for Shingles Vaccine? Yes  Shingrix first dose received 11/04/19. Second dose due 01/04/20.  Tdap: Up to date  Flu Vaccine: Up to date  Pneumococcal Vaccine: Completed series  Screening Tests Health Maintenance  Topic Date Due  . OPHTHALMOLOGY EXAM  07/17/2019  . FOOT EXAM  02/16/2020  . HEMOGLOBIN A1C  03/31/2020  . TETANUS/TDAP  01/08/2026  . INFLUENZA VACCINE  Completed  . PNA vac Low Risk Adult  Completed   Cancer Screenings:  Colorectal Screening: No longer required.   Lung Cancer Screening: (Low Dose CT Chest recommended if Age 71-80 years, 30 pack-year currently smoking OR have quit w/in 15years.) does not qualify.   Additional Screening:  Dental Screening: Recommended annual dental exams for proper oral hygiene  Community Resource Referral:  CRR required this visit?  No        Plan:  I have personally reviewed and addressed the Medicare Annual Wellness questionnaire and have noted the following in the patient's chart:  A. Medical and social history B. Use of alcohol, tobacco or illicit drugs  C. Current medications and supplements D. Functional ability and status E.  Nutritional status F.  Physical activity G. Advance directives H. List of other physicians I.  Hospitalizations, surgeries, and ER visits in previous 12 months J.  [place] such as hearing and vision if needed, cognitive and depression L. Referrals and appointments   In addition, I have reviewed and discussed with patient certain preventive protocols, quality metrics, and best practice recommendations. A written personalized care plan for preventive services as well as general preventive health recommendations were provided to patient.   Glendora Score, LPN  [id number] Nurse Health Advisor  Nurse Notes: Eye exam is scheduled for tomorrow, 11/18/19 with Dr  George Ina.   Reviewed note and plan from [place] annual screening, Was available for consultation. Agree with documentation and recommendations.

## 2019-11-17 ENCOUNTER — Ambulatory Visit (INDEPENDENT_AMBULATORY_CARE_PROVIDER_SITE_OTHER): Payer: PPO

## 2019-11-17 DIAGNOSIS — Z0000 Encounter for general adult medical examination without abnormal findings: Secondary | ICD-10-CM | POA: Diagnosis not present

## 2019-11-17 NOTE — Patient Instructions (Signed)
Charles Sellers , Thank you for taking time to come for your Medicare Wellness Visit. I appreciate your ongoing commitment to your health goals. Please review the following plan we discussed and let me know if I can assist you in the future.   Screening recommendations/referrals: Colonoscopy: No longer required.  Recommended yearly ophthalmology/optometry visit for glaucoma screening and checkup Recommended yearly dental visit for hygiene and checkup  Vaccinations: Influenza vaccine: Up to date Pneumococcal vaccine: Completed series Tdap vaccine: Up to date, due 12/2025 Shingles vaccine: Second shingrix dose due     Advanced directives: Please bring a copy of your POA (Power of [place]) and/or Living Will to your next appointment.   Conditions/risks identified: Recommend to continue to cut back on fatty meats and focus on eating more baked or broiled meats.   Next appointment: 01/25/19 @ 10:00 AM with Simona Huh Chrismon.  Preventive Care 83 Years and Older, Male Preventive care refers to lifestyle choices and visits with your health care provider that can promote health and wellness. What does preventive care include?  A yearly physical exam. This is also called an annual well check.  Dental exams once or twice a year.  Routine eye exams. Ask your health care provider how often you should have your eyes checked.  Personal lifestyle choices, including:  Daily care of your teeth and gums.  Regular physical activity.  Eating a healthy diet.  Avoiding tobacco and drug use.  Limiting alcohol use.  Practicing safe sex.  Taking low doses of aspirin every day.  Taking vitamin and mineral supplements as recommended by your health care provider. What happens during an annual well check? The services and screenings done by your health care provider during your annual well check will depend on your age, overall health, lifestyle risk factors, and family history of disease. Counseling   Your health care provider may ask you questions about your:  Alcohol use.  Tobacco use.  Drug use.  Emotional well-being.  Home and relationship well-being.  Sexual activity.  Eating habits.  History of falls.  Memory and ability to understand (cognition).  Work and work Statistician. Screening  You may have the following tests or measurements:  Height, weight, and BMI.  Blood pressure.  Lipid and cholesterol levels. These may be checked every 5 years, or more frequently if you are over 58 years old.  Skin check.  Lung cancer screening. You may have this screening every year starting at age 83 if you have a 30-pack-year history of smoking and currently smoke or have quit within the past 15 years.  Fecal occult blood test (FOBT) of the stool. You may have this test every year starting at age 83.  Flexible sigmoidoscopy or colonoscopy. You may have a sigmoidoscopy every 5 years or a colonoscopy every 10 years starting at age 83.  Prostate cancer screening. Recommendations will vary depending on your family history and other risks.  Hepatitis C blood test.  Hepatitis B blood test.  Sexually transmitted disease (STD) testing.  Diabetes screening. This is done by checking your blood sugar (glucose) after you have not eaten for a while (fasting). You may have this done every 1-3 years.  Abdominal aortic aneurysm (AAA) screening. You may need this if you are a current or former smoker.  Osteoporosis. You may be screened starting at age 83 if you are at high risk. Talk with your health care provider about your test results, treatment options, and if necessary, the need for more tests. Vaccines  Your health care provider may recommend certain vaccines, such as:  Influenza vaccine. This is recommended every year.  Tetanus, diphtheria, and acellular pertussis (Tdap, Td) vaccine. You may need a Td booster every 10 years.  Zoster vaccine. You may need this after age 64.   Pneumococcal 13-valent conjugate (PCV13) vaccine. One dose is recommended after age 63.  Pneumococcal polysaccharide (PPSV23) vaccine. One dose is recommended after age 34. Talk to your health care provider about which screenings and vaccines you need and how often you need them. This information is not intended to replace advice given to you by your health care provider. Make sure you discuss any questions you have with your health care provider. Document Released: 12/23/2015 Document Revised: 08/15/2016 Document Reviewed: 09/27/2015 Elsevier Interactive Patient Education  2017 [employer] Prevention in the Home Falls can cause injuries. They can happen to people of all ages. There are many things you can do to make your home safe and to help prevent falls. What can I do on the outside of my home?  Regularly fix the edges of walkways and driveways and fix any cracks.  Remove anything that might make you trip as you walk through a door, such as a raised step or threshold.  Trim any bushes or trees on the path to your home.  Use bright outdoor lighting.  Clear any walking paths of anything that might make someone trip, such as rocks or tools.  Regularly check to see if handrails are loose or broken. Make sure that both sides of any steps have handrails.  Any raised decks and porches should have guardrails on the edges.  Have any leaves, snow, or ice cleared regularly.  Use sand or salt on walking paths during winter.  Clean up any spills in your garage right away. This includes oil or grease spills. What can I do in the bathroom?  Use night lights.  Install grab bars by the toilet and in the tub and shower. Do not use towel bars as grab bars.  Use non-skid mats or decals in the tub or shower.  If you need to sit down in the shower, use a plastic, non-slip stool.  Keep the floor dry. Clean up any water that spills on the floor as soon as it happens.  Remove soap  buildup in the tub or shower regularly.  Attach bath mats securely with double-sided non-slip rug tape.  Do not have throw rugs and other things on the floor that can make you trip. What can I do in the bedroom?  Use night lights.  Make sure that you have a light by your bed that is easy to reach.  Do not use any sheets or blankets that are too big for your bed. They should not hang down onto the floor.  Have a firm chair that has side arms. You can use this for support while you get dressed.  Do not have throw rugs and other things on the floor that can make you trip. What can I do in the kitchen?  Clean up any spills right away.  Avoid walking on wet floors.  Keep items that you use a lot in easy-to-reach places.  If you need to reach something above you, use a strong step stool that has a grab bar.  Keep electrical cords out of the way.  Do not use floor polish or wax that makes floors slippery. If you must use wax, use non-skid floor wax.  Do  not have throw rugs and other things on the floor that can make you trip. What can I do with my stairs?  Do not leave any items on the stairs.  Make sure that there are handrails on both sides of the stairs and use them. Fix handrails that are broken or loose. Make sure that handrails are as long as the stairways.  Check any carpeting to make sure that it is firmly attached to the stairs. Fix any carpet that is loose or worn.  Avoid having throw rugs at the top or bottom of the stairs. If you do have throw rugs, attach them to the floor with carpet tape.  Make sure that you have a light switch at the top of the stairs and the bottom of the stairs. If you do not have them, ask someone to add them for you. What else can I do to help prevent falls?  Wear shoes that:  Do not have high heels.  Have rubber bottoms.  Are comfortable and fit you well.  Are closed at the toe. Do not wear sandals.  If you use a stepladder:  Make  sure that it is fully opened. Do not climb a closed stepladder.  Make sure that both sides of the stepladder are locked into place.  Ask someone to hold it for you, if possible.  Clearly mark and make sure that you can see:  Any grab bars or handrails.  First and last steps.  Where the edge of each step is.  Use tools that help you move around (mobility aids) if they are needed. These include:  Canes.  Walkers.  Scooters.  Crutches.  Turn on the lights when you go into a dark area. Replace any light bulbs as soon as they burn out.  Set up your furniture so you have a clear path. Avoid moving your furniture around.  If any of your floors are uneven, fix them.  If there are any pets around you, be aware of where they are.  Review your medicines with your doctor. Some medicines can make you feel dizzy. This can increase your chance of falling. Ask your doctor what other things that you can do to help prevent falls. This information is not intended to replace advice given to you by your health care provider. Make sure you discuss any questions you have with your health care provider. Document Released: 09/22/2009 Document Revised: 05/03/2016 Document Reviewed: 12/31/2014 Elsevier Interactive Patient Education  2017 [employer].

## 2019-11-20 DIAGNOSIS — E119 Type 2 diabetes mellitus without complications: Secondary | ICD-10-CM | POA: Diagnosis not present

## 2019-11-20 LAB — HM DIABETES EYE EXAM

## 2019-11-21 ENCOUNTER — Other Ambulatory Visit: Payer: Self-pay | Admitting: Cardiology

## 2019-11-24 NOTE — Telephone Encounter (Signed)
Rx request sent to pharmacy.  

## 2019-12-14 ENCOUNTER — Other Ambulatory Visit: Payer: Self-pay | Admitting: Cardiology

## 2019-12-21 ENCOUNTER — Encounter: Payer: Self-pay | Admitting: Podiatry

## 2019-12-21 ENCOUNTER — Ambulatory Visit: Payer: PPO | Admitting: Podiatry

## 2019-12-21 ENCOUNTER — Other Ambulatory Visit: Payer: Self-pay

## 2019-12-21 DIAGNOSIS — M79674 Pain in right toe(s): Secondary | ICD-10-CM | POA: Diagnosis not present

## 2019-12-21 DIAGNOSIS — B351 Tinea unguium: Secondary | ICD-10-CM

## 2019-12-21 DIAGNOSIS — M79675 Pain in left toe(s): Secondary | ICD-10-CM | POA: Diagnosis not present

## 2019-12-21 NOTE — Progress Notes (Signed)
Complaint:  Visit Type: Patient returns to my office for continued preventative foot care services. Complaint: Patient states" my nails have grown long and thick and become painful to walk and wear shoes" Patient has been diagnosed with DM with no foot complications. The patient presents for preventative foot care services. No changes to ROS  Podiatric Exam: Vascular: dorsalis pedis and posterior tibial pulses are palpable bilateral. Capillary return is immediate. Temperature gradient is WNL. Skin turgor WNL  Sensorium: Normal Semmes Weinstein monofilament test. Normal tactile sensation bilaterally. Nail Exam: Pt has thick disfigured discolored nails with subungual debris noted bilateral entire nail hallux through fifth toenails Ulcer Exam: There is no evidence of ulcer or pre-ulcerative changes or infection. Orthopedic Exam: Muscle tone and strength are WNL. No limitations in general ROM. No crepitus or effusions noted. Foot type and digits show no abnormalities. Bony prominences are unremarkable. Skin: No Porokeratosis. No infection or ulcers  Diagnosis:  Onychomycosis, , Pain in right toe, pain in left toes  Treatment & Plan Procedures and Treatment: Consent by patient was obtained for treatment procedures.   Debridement of mycotic and hypertrophic toenails, 1 through 5 bilateral and clearing of subungual debris. No ulceration, no infection noted. Iatrogenic lesion lateral border right great toenail. Return Visit-Office Procedure: Patient instructed to return to the office for a follow up visit 3 months for continued evaluation and treatment.    Gardiner Barefoot DPM

## 2020-01-06 ENCOUNTER — Encounter: Payer: Self-pay | Admitting: Physician Assistant

## 2020-01-06 ENCOUNTER — Ambulatory Visit (INDEPENDENT_AMBULATORY_CARE_PROVIDER_SITE_OTHER): Payer: PPO | Admitting: Physician Assistant

## 2020-01-06 ENCOUNTER — Other Ambulatory Visit: Payer: Self-pay

## 2020-01-06 ENCOUNTER — Ambulatory Visit: Payer: Self-pay | Admitting: *Deleted

## 2020-01-06 VITALS — BP 142/86 | HR 88 | Temp 96.8°F | Wt 176.8 lb

## 2020-01-06 DIAGNOSIS — R310 Gross hematuria: Secondary | ICD-10-CM

## 2020-01-06 DIAGNOSIS — N3090 Cystitis, unspecified without hematuria: Secondary | ICD-10-CM

## 2020-01-06 MED ORDER — SULFAMETHOXAZOLE-TRIMETHOPRIM 800-160 MG PO TABS: 1.0000 | ORAL_TABLET | Freq: Two times a day (BID) | ORAL | 0 refills | Status: AC

## 2020-01-06 NOTE — Telephone Encounter (Addendum)
  Pt called in c/o having bloody urine that started this morning.   He has been 6 times and every time there is blood.   It's bright red.   He is on Plavix.  Denies any pain or burning.   Just urgency.   He has had his Plavix today.  He denies dizziness or weakness.  I attempted to call [facility] several times however they are out of the office for lunch so there is no answer.  He urinated again just now and stated,  "It seems to not be as bad this time".     He really does not want to go to the ED if possible due to the COVID-19 pandemic.   He said,  "When they open I'll just get dressed and head on up there".    I let him know he would need an appt or to talk with someone first not to just show up.   "I will call back up there in a few minutes".  I let him know if he started bleeding more or he becomes dizzy to call 911.  He was agreeable to this.    I let him know I would send a high priority note to the office for someone to call him back.  Protocol is to be seen within 4 hours or PCP triage.  I sent a high priority note to the office.  I called around 1:20 PM to be sure his message came through.   It did as an urgent message and they could be calling him soon.       Reason for Disposition . Taking Coumadin (warfarin) or other strong blood thinner, or known bleeding disorder (e.g., thrombocytopenia)  Answer Assessment - Initial Assessment Questions 1. COLOR of URINE: "Describe the color of the urine."  (e.g., tea-colored, pink, red, blood clots, bloody)     This morning I started having bright red urine.   No clots. 2. ONSET: "When did the bleeding start?"      This morning 3. EPISODES: "How many times has there been blood in the urine?" or "How many times today?"     6 times 4. PAIN with URINATION: "Is there any pain with passing your urine?" If so, ask: "How bad is the pain?"  (Scale 1-10; or mild, moderate, severe)    - MILD - complains slightly about urination  hurting    - MODERATE - interferes with normal activities      - SEVERE - excruciating, unwilling or unable to urinate because of the pain      No burning.   I'm having urgency. 5. FEVER: "Do you have a fever?" If so, ask: "What is your temperature, how was it measured, and when did it start?"     No fever 6. ASSOCIATED SYMPTOMS: "Are you passing urine more frequently than usual?"     No 7. OTHER SYMPTOMS: "Do you have any other symptoms?" (e.g., back/flank pain, abdominal pain, vomiting)     No flank pain,no pain 8. PREGNANCY: "Is there any chance you are pregnant?" "When was your last menstrual period?"     N/A  Protocols used: URINE - BLOOD IN-A-AH

## 2020-01-06 NOTE — Progress Notes (Signed)
Patient: Charles Sellers. Male    DOB: 1932/02/20   86 y.o.   MRN: [id number] Visit Date: 01/06/2020  Today's Provider: Trinna Post, PA-C   Chief Complaint  Patient presents with  . Hematuria   Subjective:    Patient is an 86 y/o man on Plavix for CAD s/p angioplasty presenting today for hematuria and dysuria. He reports a remote history of kidney stones in the 60's. He denies smoking now or ever.  Hematuria This is a new problem. The current episode started today. The problem is unchanged. He reports clotting at the middle of his urine stream. His pain is at a severity of 0/10. He is experiencing no pain. He describes his urine color as dark red. Associated symptoms include abdominal pain. Pertinent negatives include no dysuria, flank pain, genital pain, hesitancy or inability to urinate. His past medical history is significant for kidney stones.    Allergies  Allergen Reactions  . Niaspan [Niacin Er] Rash  . Penicillins Rash     Current Outpatient Medications:  .  APPLE CIDER VINEGAR PO, Take by mouth daily. , Disp: , Rfl:  .  cholecalciferol (VITAMIN D) 1000 UNITS tablet, Take 1,000 Units by mouth daily., Disp: , Rfl:  .  clopidogrel (PLAVIX) 75 MG tablet, TAKE 1 TABLET BY MOUTH DAILY, Disp: 90 tablet, Rfl: 3 .  metFORMIN (GLUCOPHAGE) 500 MG tablet, Take 1 tablet (500 mg total) by mouth daily with breakfast., Disp: 90 tablet, Rfl: 3 .  metoprolol succinate (TOPROL-XL) 25 MG 24 hr tablet, Take 0.5 tablets (12.5 mg total) by mouth daily. NEED OV., Disp: 45 tablet, Rfl: 0 .  Multiple Vitamin (MULTIVITAMIN) tablet, Take 1 tablet by mouth daily., Disp: , Rfl:  .  Multiple Vitamins-Minerals (ICAPS AREDS 2 PO), Take 1 capsule by mouth daily., Disp: , Rfl:  .  naproxen sodium (ANAPROX) 220 MG tablet, Take 220 mg by mouth daily as needed. , Disp: , Rfl:  .  nitroGLYCERIN (NITROSTAT) 0.4 MG SL tablet, Place 1 tablet (0.4 mg total) under the tongue every 5 (five) minutes as  needed for chest pain., Disp: 25 tablet, Rfl: 3 .  Omega-3 Fatty Acids (FISH OIL) 1000 MG CAPS, Take 1,000 mg by mouth daily., Disp: , Rfl:  .  ONETOUCH DELICA LANCETS FINE MISC, One finger stick daily for fasting blood sugar check., Disp: 100 each, Rfl: 3 .  ONETOUCH ULTRA test strip, USE EVERY DAY AS DIRECTED, Disp: 100 each, Rfl: 4 .  pantoprazole (PROTONIX) 40 MG tablet, TAKE ONE TABLET BY MOUTH ON MONDAY WEDNESDAY AND FRIDAY OF EACH WEEK, Disp: 12 tablet, Rfl: 3 .  ramipril (ALTACE) 5 MG capsule, TAKE 1 CAPSULE BY MOUTH ONCE DAILY, Disp: 90 capsule, Rfl: 0 .  rosuvastatin (CRESTOR) 10 MG tablet, TAKE 1 TABLET BY MOUTH DAILY, Disp: 90 tablet, Rfl: 3  Review of Systems  Gastrointestinal: Positive for abdominal pain.  Genitourinary: Positive for hematuria. Negative for dysuria, flank pain and hesitancy.    Social History   Tobacco Use  . Smoking status: Never Smoker  . Smokeless tobacco: Never Used  Substance Use Topics  . Alcohol use: No    Alcohol/week: 0.0 standard drinks      Objective:   BP (!) 142/86 (BP Location: Left Arm, Patient Position: Sitting, Cuff Size: Normal)   Pulse 88   Temp (!) 96.8 F (36 C) (Temporal)   Wt 176 lb 12.8 oz (80.2 kg)   BMI 26.88 kg/m  Vitals:   01/06/20 1426  BP: (!) 142/86  Pulse: 88  Temp: (!) 96.8 F (36 C)  TempSrc: Temporal  Weight: 176 lb 12.8 oz (80.2 kg)  Body mass index is 26.88 kg/m.   Physical Exam Constitutional:      Appearance: Normal appearance.  Cardiovascular:     Rate and Rhythm: Normal rate and regular rhythm.     Heart sounds: Normal heart sounds.  Pulmonary:     Effort: Pulmonary effort is normal.     Breath sounds: Normal breath sounds.  Abdominal:     General: Bowel sounds are normal.     Palpations: Abdomen is soft.     Tenderness: There is no right CVA tenderness or left CVA tenderness.  Skin:    General: Skin is warm and dry.  Neurological:     Mental Status: He is alert and oriented to person,  place, and time. Mental status is at baseline.  Psychiatric:        Mood and Affect: Mood normal.        Behavior: Behavior normal.      No results found for any visits on 01/06/20.     Assessment & Plan    1. Cystitis  DDx: Cystitis, pyelonephritis, nephrolithiasis, malignancy. Renal function is normal. Will send urine off for culture and microscopy. Will cover empirically for infection. If not resolving, will have him see urology.   - sulfamethoxazole-trimethoprim (BACTRIM DS) 800-160 MG tablet; Take 1 tablet by mouth 2 (two) times daily for 10 days.  Dispense: 20 tablet; Refill: 0 - CULTURE, URINE COMPREHENSIVE - Urinalysis, microscopic only - Comprehensive Metabolic Panel (CMET) - CBC with Differential  2. Gross hematuria  - Urinalysis, microscopic only - Comprehensive Metabolic Panel (CMET) - CBC with Differential  The entirety of the information documented in the History of Present Illness, Review of Systems and Physical Exam were personally obtained by me. Portions of this information were initially documented by [facility] and reviewed by me for thoroughness and accuracy.      Trinna Post, PA-C  [facility]

## 2020-01-06 NOTE — Telephone Encounter (Signed)
Began voiding bloody urine this morning. No clots noticed. Occurred 4x today. Now just pink-tinged urine, frequency but drinking extra water today. Denies any pain/fever/all covid symptoms. Advised continue to drink water. Scheduled office visit this afternoon with A.Pollack at 2:40p. No availability with pcp today. Routing to office.   Reason for Disposition . Blood in urine  (Exception: could be normal menstrual bleeding)  Answer Assessment - Initial Assessment Questions 1. COLOR of URINE: "Describe the color of the urine."  (e.g., tea-colored, pink, red, blood clots, bloody)     Bright red urine this morning. Now pink-tinged.  2. ONSET: "When did the bleeding start?"      Today 3. EPISODES: "How many times has there been blood in the urine?" or "How many times today?"     4x 4. PAIN with URINATION: "Is there any pain with passing your urine?" If so, ask: "How bad is the pain?"  (Scale 1-10; or mild, moderate, severe)    - MILD - complains slightly about urination hurting    - MODERATE - interferes with normal activities      - SEVERE - excruciating, unwilling or unable to urinate because of the pain      No pain but frequency 5. FEVER: "Do you have a fever?" If so, ask: "What is your temperature, how was it measured, and when did it start?"     no 6. ASSOCIATED SYMPTOMS: "Are you passing urine more frequently than usual?"     7. OTHER SYMPTOMS: "Do you have any other symptoms?" (e.g., back/flank pain, abdominal pain, vomiting)     none 8. PREGNANCY: "Is there any chance you are pregnant?" "When was your last menstrual period?"     na  Protocols used: URINE - BLOOD IN-A-AH

## 2020-01-06 NOTE — Telephone Encounter (Signed)
Patient had a visit today,01/06/2020 @ 2:40 PM.

## 2020-01-07 ENCOUNTER — Telehealth: Payer: Self-pay

## 2020-01-07 ENCOUNTER — Telehealth: Payer: Self-pay | Admitting: Family Medicine

## 2020-01-07 LAB — CBC WITH DIFFERENTIAL/PLATELET
Basophils Absolute: 0 10*3/uL (ref 0.0–0.2)
Basos: 1 %
EOS (ABSOLUTE): 0.1 10*3/uL (ref 0.0–0.4)
Eos: 2 %
Hematocrit: 45.9 % (ref 37.5–51.0)
Hemoglobin: 15.3 g/dL (ref 13.0–17.7)
Immature Grans (Abs): 0 10*3/uL (ref 0.0–0.1)
Immature Granulocytes: 0 %
Lymphocytes Absolute: 1.3 10*3/uL (ref 0.7–3.1)
Lymphs: 21 %
MCH: 29.9 pg (ref 26.6–33.0)
MCHC: 33.3 g/dL (ref 31.5–35.7)
MCV: 90 fL (ref 79–97)
Monocytes Absolute: 0.7 10*3/uL (ref 0.1–0.9)
Monocytes: 11 %
Neutrophils Absolute: 4.2 10*3/uL (ref 1.4–7.0)
Neutrophils: 65 %
Platelets: 154 10*3/uL (ref 150–450)
RBC: 5.11 x10E6/uL (ref 4.14–5.80)
RDW: 12.5 % (ref 11.6–15.4)
WBC: 6.4 10*3/uL (ref 3.4–10.8)

## 2020-01-07 LAB — COMPREHENSIVE METABOLIC PANEL
ALT: 17 IU/L (ref 0–44)
AST: 18 IU/L (ref 0–40)
Albumin/Globulin Ratio: 1.7 (ref 1.2–2.2)
Albumin: 4.2 g/dL (ref 3.6–4.6)
Alkaline Phosphatase: 69 IU/L (ref 39–117)
BUN/Creatinine Ratio: 13 (ref 10–24)
BUN: 10 mg/dL (ref 8–27)
Bilirubin Total: 0.5 mg/dL (ref 0.0–1.2)
CO2: 22 mmol/L (ref 20–29)
Calcium: 8.9 mg/dL (ref 8.6–10.2)
Chloride: 102 mmol/L (ref 96–106)
Creatinine, Ser: 0.78 mg/dL (ref 0.76–1.27)
GFR calc Af Amer: 95 mL/min/{1.73_m2} (ref >59)
GFR calc non Af Amer: 82 mL/min/{1.73_m2} (ref >59)
Globulin, Total: 2.5 g/dL (ref 1.5–4.5)
Glucose: 224 mg/dL — ABNORMAL HIGH (ref 65–99)
Potassium: 4.5 mmol/L (ref 3.5–5.2)
Sodium: 141 mmol/L (ref 134–144)
Total Protein: 6.7 g/dL (ref 6.0–8.5)

## 2020-01-07 LAB — URINALYSIS, MICROSCOPIC ONLY: Casts: NONE SEEN /lpf

## 2020-01-07 NOTE — Telephone Encounter (Signed)
It could be a variety of things including infection, kidney stones, or mass. I wouldn't expect it to improve after one day of antibiotics even if it were an infection. His kidney function looked good on the labs and that is reassuring. I think he needs to complete more of the medication and we can reassess. If he is still having blood at that point we would likely refer him to a urologist.

## 2020-01-07 NOTE — Telephone Encounter (Signed)
Pt's daughter [name] called 2nd time.  She was told pt's urine culture results has not come back. Porshe-CMA said to let daughter know pt needs to keep taking the medication prescribed and to follow Adriana's instructions from last visit. Porshe sent Fabio Bering a message to let her know the pt's urine is still a pink color.  Thanks, [employer]

## 2020-01-07 NOTE — Telephone Encounter (Signed)
Patient was advised and states that he will finish up the medication and if he still see blood in his urine he will let Adriana know.

## 2020-01-07 NOTE — Telephone Encounter (Signed)
Patient was advised and states that he started the medication yesterday afternoon. Patient states that he still has blood in his urine and want to know what could be going on for him to still have the blood in his urine.

## 2020-01-07 NOTE — Patient Instructions (Signed)
Hematuria, Adult Hematuria is blood in the urine. Blood may be visible in the urine, or it may be identified with a test. This condition can be caused by infections of the bladder, urethra, kidney, or prostate. Other possible causes include:  Kidney stones.  Cancer of the urinary tract.  Too much calcium in the urine.  Conditions that are passed from parent to child (inherited conditions).  Exercise that requires a lot of energy. Infections can usually be treated with medicine, and a kidney stone usually will pass through your urine. If neither of these is the cause of your hematuria, more tests may be needed to identify the cause of your symptoms. It is very important to tell your health care provider about any blood in your urine, even if it is painless or the blood stops without treatment. Blood in the urine, when it happens and then stops and then happens again, can be a symptom of a very serious condition, including cancer. There is no pain in the initial stages of many urinary cancers. Follow these instructions at home: Medicines  Take over-the-counter and prescription medicines only as told by your health care provider.  If you were prescribed an antibiotic medicine, take it as told by your health care provider. Do not stop taking the antibiotic even if you start to feel better. Eating and drinking  Drink enough fluid to keep your urine clear or pale yellow. It is recommended that you drink 3-4 quarts (2.8-3.8 L) a day. If you have been diagnosed with an infection, it is recommended that you drink cranberry juice in addition to large amounts of water.  Avoid caffeine, tea, and carbonated beverages. These tend to irritate the bladder.  Avoid alcohol because it may irritate the prostate (men). General instructions  If you have been diagnosed with a kidney stone, follow your health care provider's instructions about straining your urine to catch the stone.  Empty your bladder  often. Avoid holding urine for long periods of time.  If you are male: ? After a bowel movement, wipe from front to back and use each piece of toilet paper only once. ? Empty your bladder before and after sex.  Pay attention to any changes in your symptoms. Tell your health care provider about any changes or any new symptoms.  It is your responsibility to get your test results. Ask your health care provider, or the department performing the test, when your results will be ready.  Keep all follow-up visits as told by your health care provider. This is important. Contact a health care provider if:  You develop back pain.  You have a fever.  You have nausea or vomiting.  Your symptoms do not improve after 3 days.  Your symptoms get worse. Get help right away if:  You develop severe vomiting and are unable take medicine without vomiting.  You develop severe pain in your back or abdomen even though you are taking medicine.  You pass a large amount of blood in your urine.  You pass blood clots in your urine.  You feel very weak or like you might faint.  You faint. Summary  Hematuria is blood in the urine. It has many possible causes.  It is very important that you tell your health care provider about any blood in your urine, even if it is painless or the blood stops without treatment.  Take over-the-counter and prescription medicines only as told by your health care provider.  Drink enough fluid to keep   your urine clear or pale yellow. This information is not intended to replace advice given to you by your health care provider. Make sure you discuss any questions you have with your health care provider. Document Revised: 04/22/2019 Document Reviewed: 12/29/2016 Elsevier Patient Education  2020 Elsevier Inc.  

## 2020-01-07 NOTE — Telephone Encounter (Signed)
-----   Message from Trinna Post, [place] sent at 01/07/2020  1:01 PM EST ----- His sugars are a little high but in line with his diabetes. His kidney function and electrolytes look OK. Urine microscopy showed blood in urine which was also apparent int he clinic. How is he doing with the medication?

## 2020-01-07 NOTE — Telephone Encounter (Signed)
Pt saw adriana yesterday and his daughter [name] is calling and would like results of UA and pt is still passing blood. [name] as Chauncey Reading . Please call emily

## 2020-01-08 ENCOUNTER — Telehealth: Payer: Self-pay

## 2020-01-08 DIAGNOSIS — R310 Gross hematuria: Secondary | ICD-10-CM

## 2020-01-08 NOTE — Telephone Encounter (Signed)
-----   Message from Trinna Post, [place] sent at 01/08/2020 10:19 AM EST ----- Preliminary report of his urine culture is not showing a bacteria. I think he should see a urologist for this issue. Is he OK if I place the referral?

## 2020-01-08 NOTE — Telephone Encounter (Signed)
Referral placed.

## 2020-01-08 NOTE — Telephone Encounter (Signed)
Patient was advised and states that the referral can be placed for the urologist. I advised patient that someone will reach out to him for appointment.

## 2020-01-09 LAB — CULTURE, URINE COMPREHENSIVE

## 2020-01-11 NOTE — Telephone Encounter (Signed)
Copied from [place] [id number]. Topic: General - Inquiry >> Jan 11, 2020  1:48 PM Alease Frame wrote: Reason for CRM: Patient is wanting a call back from office  Please call daughter [phone]

## 2020-01-12 NOTE — Telephone Encounter (Signed)
LMTCB

## 2020-01-12 NOTE — Telephone Encounter (Signed)
Patients daughter call was returned and she states that she think patient is not bleeding anymore it has cleared up after patient finished the antibiotics. She states that she would like to be called and I advised her that her name or number is not listed in the telephone call and that it is listed on his DRP. She states that her father will call her and try to advise her what is going on and I advised her that I make sure before hanging up the phone with patient that he is aware/ understand what is been stated to him and patient verifies that he understands. [name] also asked about the referral that is been placed and I explained to her that it was placed due patient having blood in his urine to get further evaluated.FYI

## 2020-01-12 NOTE — Telephone Encounter (Signed)
That's great news. If they would still like to proceed with the referral for urology they should feel free to.

## 2020-01-12 NOTE — Telephone Encounter (Signed)
Daughter Arnoldo Hooker would like you to call her and explain why pt needs to see a urologist. She states pt does not understand and is calling her asking questions.  She states that is why she asked you to call her first.  [phone]

## 2020-01-19 ENCOUNTER — Other Ambulatory Visit: Payer: Self-pay | Admitting: Family Medicine

## 2020-01-21 ENCOUNTER — Telehealth: Payer: Self-pay | Admitting: Cardiology

## 2020-01-21 MED ORDER — METOPROLOL SUCCINATE ER 25 MG PO TB24: 12.5000 mg | ORAL_TABLET | Freq: Every day | ORAL | 3 refills | Status: DC

## 2020-01-21 NOTE — Telephone Encounter (Signed)
Refilled patient's metoprolol xl. Patient seen in Oct 2020 and is not due for appt until 10/21. Prescription sent to [facility].

## 2020-01-21 NOTE — Telephone Encounter (Signed)
Pt c/o medication issue:  1. Name of Medication: metoprolol succinate (TOPROL-XL) 25 MG 24 hr tablet  2. How are you currently taking this medication (dosage and times per day)? Half a tablet daily  3. Are you having a reaction (difficulty breathing--STAT)? no  4. What is your medication issue? Patient states [facility] told him he is unable to get a refill on his medication, because he needs an office visit. He is not due to be seen until 09/2020 and is out of his medication. Please advise.

## 2020-01-26 ENCOUNTER — Ambulatory Visit (INDEPENDENT_AMBULATORY_CARE_PROVIDER_SITE_OTHER): Payer: PPO | Admitting: Family Medicine

## 2020-01-26 ENCOUNTER — Other Ambulatory Visit: Payer: Self-pay

## 2020-01-26 VITALS — BP 110/70 | HR 63 | Temp 96.9°F | Wt 176.0 lb

## 2020-01-26 DIAGNOSIS — N181 Chronic kidney disease, stage 1: Secondary | ICD-10-CM

## 2020-01-26 DIAGNOSIS — E785 Hyperlipidemia, unspecified: Secondary | ICD-10-CM | POA: Diagnosis not present

## 2020-01-26 DIAGNOSIS — E1122 Type 2 diabetes mellitus with diabetic chronic kidney disease: Secondary | ICD-10-CM

## 2020-01-26 DIAGNOSIS — I10 Essential (primary) hypertension: Secondary | ICD-10-CM | POA: Diagnosis not present

## 2020-01-26 DIAGNOSIS — N400 Enlarged prostate without lower urinary tract symptoms: Secondary | ICD-10-CM

## 2020-01-26 NOTE — Progress Notes (Signed)
Charles Sellers.  MRN: [id number] DOB: 18-Sep-1932  Subjective:  HPI    Diabetes Mellitus Type II, Follow-up:   Lab Results  Component Value Date   HGBA1C 6.5 (H) 10/01/2019   HGBA1C 7.0 (H) 05/15/2019   HGBA1C 7.2 (A) 02/16/2019   The patient was last seen on 01/06/20 for an acute visit.   Management since then includes none He reports good compliance with treatment. He is not having side effects.   Home blood sugar records: range from 110-180   Episodes of hypoglycemia? no Most Recent Eye Exam: 11/20/19 Weight trend: fluctuating a bit  ------------------------------------------------------------------------   Hypertension, follow-up:  BP Readings from Last 3 Encounters:  01/26/20 110/70  01/06/20 (!) 142/86  11/16/19 (!) 142/72    BP at that visit was borderline elevated.. Management since that visit includes none.He reports good compliance with treatment. He is not having side effects.  Outside blood pressures are not checked by patient.   ------------------------------------------------------------------------    Lipid/Cholesterol, Follow-up:   Management since his last visit include none.  Last Lipid Panel:    Component Value Date/Time   CHOL 127 02/16/2019 1014   TRIG 69 02/16/2019 1014   HDL 42 02/16/2019 1014   CHOLHDL 3.0 02/16/2019 1014   CHOLHDL 3.2 06/06/2008 2020   VLDL 6 06/06/2008 2020   LDLCALC 71 02/16/2019 1014    He reports good compliance with treatment. He is not having side effects.   Wt Readings from Last 3 Encounters:  01/26/20 176 lb (79.8 kg)  01/06/20 176 lb 12.8 oz (80.2 kg)  11/16/19 171 lb (77.6 kg)    ------------------------------------------------------------------------   Patient Active Problem List   Diagnosis Date Noted  . Pain due to onychomycosis of toenails of both feet 07/06/2019  . Mild aortic stenosis 09/04/2017  . 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 Mitral regurgitation 07/30/2017  .  History of prolonged Q-T interval on ECG 06/22/2015  . Prostate lump 06/22/2015  . 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 Diabetes mellitus, type 2 ([place]) 06/22/2015  . CAD S/P percutaneous coronary angioplasty   . Diabetes mellitus type II, controlled ([place])   . Dyslipidemia, goal LDL below 70   . Essential hypertension   . Arthropathia 02/17/2010  . Chondrocalcinosis of multiple sites 08/11/2008  . Acid reflux 08/11/2008  . Cannot sleep 07/23/2008  . UNSPECIFIED INFLAMMATORY POLYARTHROPATHY 07/20/2008  . MYOSITIS 07/20/2008  . FEVER UNSPECIFIED 07/20/2008  . EXANTHEM 07/20/2008  . Muscle ache 07/16/2008  . Disorder of sacrum 06/19/2008  . Thyrotoxicosis 06/19/2008    Past Medical History:  Diagnosis Date  . Atherosclerosis   . CAD (coronary artery disease) 2011 & 2009   Echo, EF =>55% 2011/ Echo, EF =>55% 2009  . CAD S/P percutaneous coronary angioplasty 12/13 & 15 2005   PCI - mRCA (Cypher DES 3.5 mm x 18 mm --> 4.46mm); RPL Cypher 2.5 mm x 13 mm;; mLAD Cy a pher DES 3.0 mm x 13 mm (3.5 mm) with Cutting PTCA of D2 ostium;; mid Cx 100% after small OM1.  . Cholecystitis   . Dermatitis   . Diabetes mellitus type II, controlled ([place])    Diet controlled. No medications; he indicates "borderline diabetes"  . Dyslipidemia, goal LDL below 70     on statin  . Hypertension   . Leaky heart valve   . Prostate nodule   . Ventral hernia    Past Surgical History:  Procedure Laterality Date  . APPENDECTOMY    .  CARDIAC CATHETERIZATION  2005  . Carotid Dopplers  September 2014   Bilateral internal carotids < 49%.  . CORONARY ANGIOPLASTY WITH STENT PLACEMENT     mRCA Cypher DES 3.5 mm x 18 mm (4.0), RPL Cypher DES 2.5 mm x 13 mm; mLAD 3.0 mm x 13 mm Cypher -> cutting PTCA of jailed D2; previousl PCI of Cx - 100% mid Cx.  Marland Kitchen NM MYOVIEW LTD  06/2012   Small area of basal inferior infarct with no ischemia. Normal EF  . Stress Myoview  2013   normal findings  . TRANSTHORACIC ECHOCARDIOGRAM   February 2014    EF 55%. Mild-moderate MR   Social History   Socioeconomic History  . Marital status: Married    Spouse name: Not on file  . Number of children: 4  . Years of education: Not on file  . Highest education level: High school graduate  Occupational History  . Occupation: retired    Comment: previously worked at [employer]  . Smoking status: Never Smoker  . Smokeless tobacco: Never Used  Substance and Sexual Activity  . Alcohol use: No    Alcohol/week: 0.0 standard drinks  . Drug use: No  . Sexual activity: Not on file  Other Topics Concern  . Not on file  Social History Narrative   Married father of 3, 9 grandchildren, now 89 great-grandchildren. 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   Using the toilet, but does not do routine exercise. Always on the go.   Never smoked. Does not drink alcohol.   Social Determinants of Health   Financial Resource Strain:   . Difficulty of Paying Living Expenses: Not on file  Food Insecurity:   . Worried About Charity fundraiser in the Last Year: Not on file  . Ran Out of Food in the Last Year: Not on file  Transportation Needs:   . Lack of Transportation (Medical): Not on file  . Lack of Transportation (Non-Medical): Not on file  Physical Activity:   . Days of Exercise per Week: Not on file  . Minutes of Exercise per Session: Not on file  Stress:   . Feeling of Stress : Not on file  Social Connections:   . Frequency of Communication with Friends and Family: Not on file  . Frequency of Social Gatherings with Friends and Family: Not on file  . Attends Religious Services: Not on file  . Active Member of Clubs or Organizations: Not on file  . Attends Archivist Meetings: Not on file  . Marital Status: Not on file  Intimate Partner Violence:   . Fear of Current or  Ex-Partner: Not on file  . Emotionally Abused: Not on file  . Physically Abused: Not on file  . Sexually Abused: Not on file   Family History  Problem Relation Age of Onset  . Stroke Mother   . Liver cancer Father   . Diabetes Father   . Esophageal cancer Brother   . Cerebral aneurysm Daughter   . Melanoma Daughter   . Cerebral aneurysm Son    Outpatient Encounter Medications as of 01/26/2020  Medication Sig  . APPLE CIDER VINEGAR PO Take by mouth daily.   . cholecalciferol (VITAMIN D) 1000 UNITS tablet Take 1,000 Units by mouth daily.  . clopidogrel (PLAVIX) 75 MG tablet TAKE 1 TABLET  BY MOUTH DAILY  . metFORMIN (GLUCOPHAGE) 500 MG tablet Take 1 tablet (500 mg total) by mouth daily with breakfast.  . metoprolol succinate (TOPROL-XL) 25 MG 24 hr tablet Take 0.5 tablets (12.5 mg total) by mouth daily.  . Multiple Vitamin (MULTIVITAMIN) tablet Take 1 tablet by mouth daily.  . Multiple Vitamins-Minerals (ICAPS AREDS 2 PO) Take 1 capsule by mouth daily.  . naproxen sodium (ANAPROX) 220 MG tablet Take 220 mg by mouth daily as needed.   . nitroGLYCERIN (NITROSTAT) 0.4 MG SL tablet Place 1 tablet (0.4 mg total) under the tongue every 5 (five) minutes as needed for chest pain.  . Omega-3 Fatty Acids (FISH OIL) 1000 MG CAPS Take 1,000 mg by mouth daily.  Glory Rosebush DELICA LANCETS FINE MISC One finger stick daily for fasting blood sugar check.  Glory Rosebush ULTRA test strip USE EVERY DAY AS DIRECTED  . pantoprazole (PROTONIX) 40 MG tablet TAKE ONE TABLET BY MOUTH ON MONDAY WEDNESDAY AND FRIDAY OF EACH WEEK  . ramipril (ALTACE) 5 MG capsule TAKE 1 CAPSULE BY MOUTH ONCE DAILY  . rosuvastatin (CRESTOR) 10 MG tablet TAKE 1 TABLET BY MOUTH DAILY   No facility-administered encounter medications on file as of 01/26/2020.   Allergies  Allergen Reactions  . Niaspan [Niacin Er] Rash  . Penicillins Rash   Review of Systems  Constitutional: Negative for chills, diaphoresis, fever and malaise/fatigue.    HENT: Negative for congestion, ear pain and sore throat.   Respiratory: Negative for cough and shortness of breath.   Cardiovascular: Negative for chest pain.  Gastrointestinal: Negative for abdominal pain and diarrhea.  Musculoskeletal: Negative for myalgias.  Neurological: Negative for headaches.    Objective:  BP 110/70 (BP Location: Right Arm, Patient Position: Sitting, Cuff Size: Normal)   Pulse 63   Temp (!) 96.9 F (36.1 C) (Skin)   Wt 176 lb (79.8 kg)   SpO2 97%   BMI 26.76 kg/m   Physical Exam  Constitutional: He is oriented to person, place, and time and well-developed, well-nourished, and in no distress.  HENT:  Head: Normocephalic.  Eyes: Conjunctivae are normal.  Cardiovascular: Normal rate.  Pulmonary/Chest: Effort normal and breath sounds normal.  Abdominal: Soft.  Genitourinary:    Genitourinary Comments: Prostate 3-4+ enlarged and hard. No tenderness.   Musculoskeletal:        General: Normal range of motion.     Cervical back: Neck supple.  Neurological: He is alert and oriented to person, place, and time.  Skin: No rash noted.  Psychiatric: Mood, affect and judgment normal.    Assessment and Plan :  1. Essential hypertension Very good BP today. No chest pains, edema, dyspnea or palpitations. Tolerating Metoprolol Succinate 12.5 mg qd and Altace 5 mg qd without side effects. Recheck CBC and CMP.  - CBC with Differential/Platelet - Comprehensive metabolic panel  2. Controlled type 2 diabetes mellitus with stage 1 chronic kidney disease, without long-term current use of insulin (HCC) Continues to take the Metformin 500 mg at breakfast with FBS in the 110-180 range. Hgb A1C was 6.5% on 10-01-19. No hypoglycemic episodes. Recheck follow up labs, continue statin and ACE-I with Metformin. Follow low fat diabetic diet and follow up pending reports. - CBC with Differential/Platelet - Comprehensive metabolic panel - Hemoglobin A1c - Lipid Panel With LDL/HDL  Ratio  3. Dyslipidemia, goal LDL below 70 Total cholesterol 127, triglycerides 69, HDL 42 and LDL 71 on 02-16-19. Still tolerating Rosuvastatin 10 mg qd prescribed by cardiologist (Dr.  Ellyn Hack). Continue low fat diet and this medication. Recheck labs and follow up pending reports. - Hemoglobin A1c - Lipid Panel With LDL/HDL Ratio - TSH  4. Enlarged prostate without lower urinary tract symptoms (luts) Denies significant nocturia or hesitancy. Wears adult diaper for dribble. Prostate significantly enlarged on DRE today. No further gross hematuria and urine dipstick negative today. Will check labs and may need urology referral if renal function affected. - CBC with Differential/Platelet - Comprehensive metabolic panel - POCT urinalysis dipstick - PSA

## 2020-01-27 LAB — CBC WITH DIFFERENTIAL/PLATELET
Basophils Absolute: 0.1 10*3/uL (ref 0.0–0.2)
Basos: 1 %
EOS (ABSOLUTE): 0.2 10*3/uL (ref 0.0–0.4)
Eos: 2 %
Hematocrit: 41.7 % (ref 37.5–51.0)
Hemoglobin: 14.3 g/dL (ref 13.0–17.7)
Immature Grans (Abs): 0 10*3/uL (ref 0.0–0.1)
Immature Granulocytes: 0 %
Lymphocytes Absolute: 1.5 10*3/uL (ref 0.7–3.1)
Lymphs: 23 %
MCH: 30.8 pg (ref 26.6–33.0)
MCHC: 34.3 g/dL (ref 31.5–35.7)
MCV: 90 fL (ref 79–97)
Monocytes Absolute: 0.7 10*3/uL (ref 0.1–0.9)
Monocytes: 11 %
Neutrophils Absolute: 4 10*3/uL (ref 1.4–7.0)
Neutrophils: 63 %
Platelets: 158 10*3/uL (ref 150–450)
RBC: 4.65 x10E6/uL (ref 4.14–5.80)
RDW: 12.5 % (ref 11.6–15.4)
WBC: 6.4 10*3/uL (ref 3.4–10.8)

## 2020-01-27 LAB — COMPREHENSIVE METABOLIC PANEL
ALT: 18 IU/L (ref 0–44)
AST: 19 IU/L (ref 0–40)
Albumin/Globulin Ratio: 1.5 (ref 1.2–2.2)
Albumin: 4.1 g/dL (ref 3.6–4.6)
Alkaline Phosphatase: 64 IU/L (ref 39–117)
BUN/Creatinine Ratio: 23 (ref 10–24)
BUN: 16 mg/dL (ref 8–27)
Bilirubin Total: 0.6 mg/dL (ref 0.0–1.2)
CO2: 25 mmol/L (ref 20–29)
Calcium: 8.8 mg/dL (ref 8.6–10.2)
Chloride: 102 mmol/L (ref 96–106)
Creatinine, Ser: 0.7 mg/dL — ABNORMAL LOW (ref 0.76–1.27)
GFR calc Af Amer: 99 mL/min/{1.73_m2} (ref >59)
GFR calc non Af Amer: 86 mL/min/{1.73_m2} (ref >59)
Globulin, Total: 2.8 g/dL (ref 1.5–4.5)
Glucose: 113 mg/dL — ABNORMAL HIGH (ref 65–99)
Potassium: 4.4 mmol/L (ref 3.5–5.2)
Sodium: 141 mmol/L (ref 134–144)
Total Protein: 6.9 g/dL (ref 6.0–8.5)

## 2020-01-27 LAB — LIPID PANEL WITH LDL/HDL RATIO
Cholesterol, Total: 126 mg/dL (ref 100–199)
HDL: 41 mg/dL (ref >39)
LDL Chol Calc (NIH): 72 mg/dL (ref 0–99)
LDL/HDL Ratio: 1.8 ratio (ref 0.0–3.6)
Triglycerides: 61 mg/dL (ref 0–149)
VLDL Cholesterol Cal: 13 mg/dL (ref 5–40)

## 2020-01-27 LAB — PSA: Prostate Specific Ag, Serum: 2.9 ng/mL (ref 0.0–4.0)

## 2020-01-27 LAB — HEMOGLOBIN A1C
Est. average glucose Bld gHb Est-mCnc: 151 mg/dL
Hgb A1c MFr Bld: 6.9 % — ABNORMAL HIGH (ref 4.8–5.6)

## 2020-01-27 LAB — TSH: TSH: 1.15 u[IU]/mL (ref 0.450–4.500)

## 2020-01-29 ENCOUNTER — Telehealth: Payer: Self-pay

## 2020-01-29 ENCOUNTER — Encounter: Payer: Self-pay | Admitting: Family Medicine

## 2020-01-29 LAB — POCT URINALYSIS DIPSTICK
Bilirubin, UA: NEGATIVE
Blood, UA: NEGATIVE
Glucose, UA: NEGATIVE
Ketones, UA: NEGATIVE
Leukocytes, UA: NEGATIVE
Nitrite, UA: NEGATIVE
Protein, UA: POSITIVE — AB
Spec Grav, UA: 1.02 (ref 1.010–1.025)
Urobilinogen, UA: 0.2 E.U./dL
pH, UA: 6 (ref 5.0–8.0)

## 2020-01-29 NOTE — Telephone Encounter (Signed)
Copied from [place] [id number]. Topic: General - Call Back - No Documentation >> Jan 29, 2020  2:52 PM Erick Blinks wrote: Reason for CRM: Nurse call back request for lab results  [phone]

## 2020-01-29 NOTE — Telephone Encounter (Signed)
Return call, no answer and no vm. Okay for [facility] to give results again.

## 2020-03-12 ENCOUNTER — Other Ambulatory Visit: Payer: Self-pay | Admitting: Cardiology

## 2020-03-21 ENCOUNTER — Ambulatory Visit: Payer: PPO | Admitting: Podiatry

## 2020-03-24 ENCOUNTER — Other Ambulatory Visit: Payer: Self-pay

## 2020-03-24 ENCOUNTER — Ambulatory Visit (INDEPENDENT_AMBULATORY_CARE_PROVIDER_SITE_OTHER): Payer: PPO | Admitting: Podiatry

## 2020-03-24 ENCOUNTER — Encounter: Payer: Self-pay | Admitting: Podiatry

## 2020-03-24 VITALS — Temp 97.0°F

## 2020-03-24 DIAGNOSIS — M79675 Pain in left toe(s): Secondary | ICD-10-CM

## 2020-03-24 DIAGNOSIS — B351 Tinea unguium: Secondary | ICD-10-CM

## 2020-03-24 DIAGNOSIS — M79674 Pain in right toe(s): Secondary | ICD-10-CM

## 2020-03-24 DIAGNOSIS — E1122 Type 2 diabetes mellitus with diabetic chronic kidney disease: Secondary | ICD-10-CM

## 2020-03-24 DIAGNOSIS — N181 Chronic kidney disease, stage 1: Secondary | ICD-10-CM

## 2020-03-24 NOTE — Progress Notes (Signed)
This patient returns to my office for at risk foot care.  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 This patient is unable to cut nails himself since the patient cannot reach his nails.These nails are painful walking and wearing shoes.  This patient presents for at risk foot care today.  General Appearance  Alert, conversant and in no acute stress.  Vascular  Dorsalis pedis and posterior tibial  pulses are palpable  bilaterally.  Capillary return is within normal limits  bilaterally. Temperature is within normal limits  bilaterally.  Neurologic  Senn-Weinstein monofilament wire test within normal limits  bilaterally. Muscle power within normal limits bilaterally.  Nails Thick disfigured discolored nails with subungual debris  from hallux to fifth toes bilaterally. No evidence of bacterial infection or drainage bilaterally.  Orthopedic  No limitations of motion  feet .  No crepitus or effusions noted.  No bony pathology or digital deformities noted.  Skin  normotropic skin with no porokeratosis noted bilaterally.  No signs of infections or ulcers noted.     Onychomycosis  Pain in right toes  Pain in left toes  Consent was obtained for treatment procedures.   Mechanical debridement of nails 1-5  bilaterally performed with a nail nipper.  Filed with dremel without incident.    Return office visit   3 months                   Told patient to return for periodic foot care and evaluation due to potential at risk complications.   Jazlynne Milliner DPM  

## 2020-04-20 ENCOUNTER — Other Ambulatory Visit: Payer: Self-pay | Admitting: Cardiology

## 2020-05-30 ENCOUNTER — Ambulatory Visit (INDEPENDENT_AMBULATORY_CARE_PROVIDER_SITE_OTHER): Payer: PPO | Admitting: Family Medicine

## 2020-05-30 ENCOUNTER — Encounter: Payer: Self-pay | Admitting: Family Medicine

## 2020-05-30 ENCOUNTER — Other Ambulatory Visit: Payer: Self-pay

## 2020-05-30 VITALS — BP 122/60 | HR 72 | Temp 97.1°F | Resp 15 | Wt 176.0 lb

## 2020-05-30 DIAGNOSIS — N181 Chronic kidney disease, stage 1: Secondary | ICD-10-CM | POA: Diagnosis not present

## 2020-05-30 DIAGNOSIS — I2510 Atherosclerotic heart disease of native coronary artery without angina pectoris: Secondary | ICD-10-CM

## 2020-05-30 DIAGNOSIS — E785 Hyperlipidemia, unspecified: Secondary | ICD-10-CM | POA: Diagnosis not present

## 2020-05-30 DIAGNOSIS — I10 Essential (primary) hypertension: Secondary | ICD-10-CM | POA: Diagnosis not present

## 2020-05-30 DIAGNOSIS — E1122 Type 2 diabetes mellitus with diabetic chronic kidney disease: Secondary | ICD-10-CM | POA: Diagnosis not present

## 2020-05-30 DIAGNOSIS — Z9861 Coronary angioplasty status: Secondary | ICD-10-CM

## 2020-05-30 LAB — POCT GLYCOSYLATED HEMOGLOBIN (HGB A1C): Hemoglobin A1C: 7 % — AB (ref 4.0–5.6)

## 2020-05-30 MED ORDER — NITROGLYCERIN 0.4 MG SL SUBL: 0.4000 mg | SUBLINGUAL_TABLET | SUBLINGUAL | 3 refills | Status: DC | PRN

## 2020-05-30 NOTE — Progress Notes (Signed)
Established patient visit   Patient: Charles Sellers.   DOB: 18-Oct-1932   86 y.o. Male  MRN: [id number] Visit Date: 05/30/2020  Today's healthcare provider: [name], PA   Clint Bolder as a Education administrator for [employer], [employer].,have documented all relevant documentation on the behalf of [name], PA,as directed by  [employer] while in the presence of [employer], [place].  Chief Complaint  Patient presents with  . Diabetes  . Hypertension  . Hyperlipidemia   Subjective    HPI Diabetes Mellitus Type II, follow-up  Lab Results  Component Value Date   HGBA1C 7.0 (A) 05/30/2020   HGBA1C 6.9 (H) 01/26/2020   HGBA1C 6.5 (H) 10/01/2019   Last seen for diabetes 4 months ago.  Management since then includes continuing the same treatment. Patient was seen by Dr. Prudence Davidson to follow up with onychomycosis of toenails of both feet, patient has scheduled follow up visit in 3 months for foot care and evaluation due to potential at risk complications.  He reports excellent compliance with treatment. He is not having side effects.   Home blood sugar records: 125-165  Episodes of hypoglycemia? No    Current insulin regiment: none Most Recent Eye Exam: 11/20/2019  --------------------------------------------------------------------------------------------------- Hypertension, follow-up  BP Readings from Last 3 Encounters:  05/30/20 122/60  01/26/20 110/70  01/06/20 (!) 142/86   Wt Readings from Last 3 Encounters:  05/30/20 176 lb (79.8 kg)  01/26/20 176 lb (79.8 kg)  01/06/20 176 lb 12.8 oz (80.2 kg)     He was last seen for hypertension 4 months ago.  BP at that visit was 110/70. Management since that visit includes none. He reports excellent compliance with treatment. He is not having side effects.  He is exercising by doing yard work He is not adherent to low salt diet.   Outside blood pressures are not being checked.  He does not  smoke.  Use of agents associated with hypertension: none.   --------------------------------------------------------------------------------------------------- Lipid/Cholesterol, follow-up  Last Lipid Panel: Lab Results  Component Value Date   CHOL 126 01/26/2020   LDLCALC 72 01/26/2020   HDL 41 01/26/2020   TRIG 61 01/26/2020    He was last seen for this 4 months ago.  Management since that visit includes none.  He reports excellent compliance with treatment. He is not having side effects.   Symptoms: No appetite changes No foot ulcerations  No chest pain No chest pressure/discomfort  No dyspnea No orthopnea  No fatigue No lower extremity edema  No palpitations No paroxysmal nocturnal dyspnea  No nausea No numbness or tingling of extremity  No polydipsia Yes polyuria  No speech difficulty No syncope   He is following a Regular diet. Current exercise: yard work  Last Sports coach Lab Results  Component Value Date   GLUCOSE 113 (H) 01/26/2020   NA 141 01/26/2020   K 4.4 01/26/2020   BUN 16 01/26/2020   CREATININE 0.70 (L) 01/26/2020   GFRNONAA 86 01/26/2020   GFRAA 99 01/26/2020   CALCIUM 8.8 01/26/2020   AST 19 01/26/2020   ALT 18 01/26/2020   The ASCVD Risk score (Goff DC Jr., et al., 2013) failed to calculate for the following reasons:   The 2013 ASCVD risk score is only valid for ages 48 to 8  ---------------------------------------------------------------------------------------------------  Patient Active Problem List   Diagnosis Date Noted  . Pain due to onychomycosis of toenails of both feet 07/06/2019  . Mild aortic  stenosis 09/04/2017  . 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 Mitral regurgitation 07/30/2017  . History of prolonged Q-T interval on ECG 06/22/2015  . Prostate lump 06/22/2015  . 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 Diabetes mellitus, type 2 ([place]) 06/22/2015  . CAD S/P percutaneous coronary angioplasty   . Diabetes mellitus  type II, controlled ([place])   . Dyslipidemia, goal LDL below 70   . Essential hypertension   . Arthropathia 02/17/2010  . Chondrocalcinosis of multiple sites 08/11/2008  . Acid reflux 08/11/2008  . Cannot sleep 07/23/2008  . UNSPECIFIED INFLAMMATORY POLYARTHROPATHY 07/20/2008  . MYOSITIS 07/20/2008  . FEVER UNSPECIFIED 07/20/2008  . EXANTHEM 07/20/2008  . Muscle ache 07/16/2008  . Disorder of sacrum 06/19/2008  . Thyrotoxicosis 06/19/2008   Past Medical History:  Diagnosis Date  . Atherosclerosis   . CAD (coronary artery disease) 2011 & 2009   Echo, EF =>55% 2011/ Echo, EF =>55% 2009  . CAD S/P percutaneous coronary angioplasty 12/13 & 15 2005   PCI - mRCA (Cypher DES 3.5 mm x 18 mm --> 4.46mm); RPL Cypher 2.5 mm x 13 mm;; mLAD Cy a pher DES 3.0 mm x 13 mm (3.5 mm) with Cutting PTCA of D2 ostium;; mid Cx 100% after small OM1.  . Cholecystitis   . Dermatitis   . Diabetes mellitus type II, controlled ([place])    Diet controlled. No medications; he indicates "borderline diabetes"  . Dyslipidemia, goal LDL below 70     on statin  . Hypertension   . Leaky heart valve   . Prostate nodule   . Ventral hernia    Past Surgical History:  Procedure Laterality Date  . APPENDECTOMY    . CARDIAC CATHETERIZATION  2005  . Carotid Dopplers  September 2014   Bilateral internal carotids < 49%.  . CORONARY ANGIOPLASTY WITH STENT PLACEMENT     mRCA Cypher DES 3.5 mm x 18 mm (4.0), RPL Cypher DES 2.5 mm x 13 mm; mLAD 3.0 mm x 13 mm Cypher -> cutting PTCA of jailed D2; previousl PCI of Cx - 100% mid Cx.  Marland Kitchen NM MYOVIEW LTD  06/2012   Small area of basal inferior infarct with no ischemia. Normal EF  . Stress Myoview  2013   normal findings  . TRANSTHORACIC ECHOCARDIOGRAM  February 2014    EF 55%. Mild-moderate MR   Family History  Problem Relation Age of Onset  . Stroke Mother   . Liver cancer Father   . Diabetes Father   . Esophageal cancer Brother   . Cerebral aneurysm Daughter   . Melanoma  Daughter   . Cerebral aneurysm Son    Allergies  Allergen Reactions  . Niaspan [Niacin Er] Rash  . Penicillins Rash   Medications: Outpatient Medications Prior to Visit  Medication Sig  . APPLE CIDER VINEGAR PO Take by mouth daily.   . cholecalciferol (VITAMIN D) 1000 UNITS tablet Take 1,000 Units by mouth daily.  . clopidogrel (PLAVIX) 75 MG tablet TAKE 1 TABLET BY MOUTH DAILY  . metFORMIN (GLUCOPHAGE) 500 MG tablet Take 1 tablet (500 mg total) by mouth daily with breakfast.  . metoprolol succinate (TOPROL-XL) 25 MG 24 hr tablet Take 0.5 tablets (12.5 mg total) by mouth daily.  . Multiple Vitamin (MULTIVITAMIN) tablet Take 1 tablet by mouth daily.  . Multiple Vitamins-Minerals (ICAPS AREDS 2 PO) Take 1 capsule by mouth daily.  . naproxen sodium (ANAPROX) 220 MG tablet Take 220 mg by mouth daily as needed.   Marland Kitchen  nitroGLYCERIN (NITROSTAT) 0.4 MG SL tablet Place 1 tablet (0.4 mg total) under the tongue every 5 (five) minutes as needed for chest pain.  . Omega-3 Fatty Acids (FISH OIL) 1000 MG CAPS Take 1,000 mg by mouth daily.  Glory Rosebush DELICA LANCETS FINE MISC One finger stick daily for fasting blood sugar check.  Glory Rosebush ULTRA test strip USE EVERY DAY AS DIRECTED  . pantoprazole (PROTONIX) 40 MG tablet TAKE 1 TABLET ON MON WED AND FRI OF EACHWEEK  . ramipril (ALTACE) 5 MG capsule TAKE 1 CAPSULE BY MOUTH ONCE DAILY  . rosuvastatin (CRESTOR) 10 MG tablet TAKE 1 TABLET BY MOUTH DAILY   No facility-administered medications prior to visit.    Review of Systems  Endocrine: Positive for polyuria. Negative for cold intolerance, heat intolerance, polydipsia and polyphagia.   Social History   Tobacco Use  . Smoking status: Never Smoker  . Smokeless tobacco: Never Used  Vaping Use  . Vaping Use: Never used  Substance Use Topics  . Alcohol use: No    Alcohol/week: 0.0 standard drinks  . Drug use: No    Objective    BP 122/60   Pulse 72   Temp (!) 97.1 F (36.2 C) (Oral)    Resp 15   Wt 176 lb (79.8 kg)   SpO2 96%   BMI 26.76 kg/m  BP Readings from Last 3 Encounters:  05/30/20 122/60  01/26/20 110/70  01/06/20 (!) 142/86   Wt Readings from Last 3 Encounters:  05/30/20 176 lb (79.8 kg)  01/26/20 176 lb (79.8 kg)  01/06/20 176 lb 12.8 oz (80.2 kg)      Physical Exam Vitals reviewed. Exam conducted with a chaperone present.  Constitutional:      Appearance: Normal appearance.  HENT:     Head: Normocephalic.  Cardiovascular:     Rate and Rhythm: Normal rate and regular rhythm.     Heart sounds: Normal heart sounds.  Pulmonary:     Effort: Pulmonary effort is normal.     Breath sounds: Normal breath sounds.  Skin:    General: Skin is warm and dry.  Neurological:     Mental Status: He is alert and oriented to person, place, and time. Mental status is at baseline.  Psychiatric:        Mood and Affect: Mood normal.        Behavior: Behavior normal.     Results for orders placed or performed in visit on 05/30/20  POCT glycosylated hemoglobin (Hb A1C)  Result Value Ref Range   Hemoglobin A1C 7.0 (A) 4.0 - 5.6 %   HbA1c POC (<> result, manual entry)     HbA1c, POC (prediabetic range)     HbA1c, POC (controlled diabetic range)      Assessment & Plan    1. Controlled type 2 diabetes mellitus with stage 1 chronic kidney disease, without long-term current use of insulin (HCC) Hgb A1C 7.0% today. Denies polydipsia but some nocturia 4-5 times. No blurring of vision and has annual ophthalmology exam in December. Had podiatry appointment in April 2021 without peripheral neuropathy. Will get labs and continue statin with ACE-I. Trying to control diabetes with diet , exercise (farming) and Metformin 500 mg with breakfast. Recheck urine microalbumin and CMP. Continue present regimen. - POCT glycosylated hemoglobin (Hb A1C) - Comprehensive metabolic panel - Microalbumin, urine  2. Essential hypertension Well controlled BP on the Altace 5 mg qd and  Metoprolol Succinate 25 mg 0.5 tablet qd. Recheck labs  and drink plenty of fluids. Restrict exposure to he hot weather. - CBC with Differential/Platelet  3. Dyslipidemia, goal LDL below 70 Tolerating the Crestor 10 mg qd and trying to restrict fats in diet. Recheck lipid panel. - Lipid panel  4. CAD S/P percutaneous coronary angioplasty No significant chest pains, peripheral edema or dyspnea. Requests refill of Nitrostat for prn use. Followed by Dr. Ellyn Hack (cardiologist) annually in October 2021. - nitroGLYCERIN (NITROSTAT) 0.4 MG SL tablet; Place 1 tablet (0.4 mg total) under the tongue every 5 (five) minutes as needed for chest pain.  Dispense: 25 tablet; Refill: 3   No follow-ups on file.         [name], [place] [phone] (phone) [phone] (fax)  [place]

## 2020-05-30 NOTE — Patient Instructions (Addendum)
Managing Your Hypertension Hypertension is commonly called high blood pressure. This is when the force of your blood pressing against the walls of your arteries is too strong. Arteries are blood vessels that carry blood from your heart throughout your body. Hypertension forces the heart to work harder to pump blood, and may cause the arteries to become narrow or stiff. Having untreated or uncontrolled hypertension can cause heart attack, stroke, kidney disease, and other problems. What are blood pressure readings? A blood pressure reading consists of a higher number over a lower number. Ideally, your blood pressure should be below 120/80. The first ("top") number is called the systolic pressure. It is a measure of the pressure in your arteries as your heart beats. The second ("bottom") number is called the diastolic pressure. It is a measure of the pressure in your arteries as the heart relaxes. What does my blood pressure reading mean? Blood pressure is classified into four stages. Based on your blood pressure reading, your health care provider may use the following stages to determine what type of treatment you need, if any. Systolic pressure and diastolic pressure are measured in a unit called mm Hg. Normal  Systolic pressure: below 078.  Diastolic pressure: below 80. Elevated  Systolic pressure: 675-449.  Diastolic pressure: below 80. Hypertension stage 1  Systolic pressure: 201-007.  Diastolic pressure: 12-19. Hypertension stage 2  Systolic pressure: 758 or above.  Diastolic pressure: 90 or above. What health risks are associated with hypertension? Managing your hypertension is an important responsibility. Uncontrolled hypertension can lead to:  A heart attack.  A stroke.  A weakened blood vessel (aneurysm).  Heart failure.  Kidney damage.  Eye damage.  Metabolic syndrome.  Memory and concentration problems. What changes can I make to manage my  hypertension? Hypertension can be managed by making lifestyle changes and possibly by taking medicines. Your health care provider will help you make a plan to bring your blood pressure within a normal range. Eating and drinking   Eat a diet that is high in fiber and potassium, and low in salt (sodium), added sugar, and fat. An example eating plan is called the DASH (Dietary Approaches to Stop Hypertension) diet. To eat this way: ? Eat plenty of fresh fruits and vegetables. Try to fill half of your plate at each meal with fruits and vegetables. ? Eat whole grains, such as whole wheat pasta, brown rice, or whole grain bread. Fill about one quarter of your plate with whole grains. ? Eat low-fat diary products. ? Avoid fatty cuts of meat, processed or cured meats, and poultry with skin. Fill about one quarter of your plate with lean proteins such as fish, chicken without skin, beans, eggs, and tofu. ? Avoid premade and processed foods. These tend to be higher in sodium, added sugar, and fat.  Reduce your daily sodium intake. Most people with hypertension should eat less than 1,500 mg of sodium a day.  Limit alcohol intake to no more than 1 drink a day for nonpregnant women and 2 drinks a day for men. One drink equals 12 oz of beer, 5 oz of wine, or 1 oz of hard liquor. Lifestyle  Work with your health care provider to maintain a healthy body weight, or to lose weight. Ask what an ideal weight is for you.  Get at least 30 minutes of exercise that causes your heart to beat faster (aerobic exercise) most days of the week. Activities may include walking, swimming, or biking.  Include exercise  to strengthen your muscles (resistance exercise), such as weight lifting, as part of your weekly exercise routine. Try to do these types of exercises for 30 minutes at least 3 days a week.  Do not use any products that contain nicotine or tobacco, such as cigarettes and e-cigarettes. If you need help quitting,  ask your health care provider.  Control any long-term (chronic) conditions you have, such as high cholesterol or diabetes. Monitoring  Monitor your blood pressure at home as told by your health care provider. Your personal target blood pressure may vary depending on your medical conditions, your age, and other factors.  Have your blood pressure checked regularly, as often as told by your health care provider. Working with your health care provider  Review all the medicines you take with your health care provider because there may be side effects or interactions.  Talk with your health care provider about your diet, exercise habits, and other lifestyle factors that may be contributing to hypertension.  Visit your health care provider regularly. Your health care provider can help you create and adjust your plan for managing hypertension. Will I need medicine to control my blood pressure? Your health care provider may prescribe medicine if lifestyle changes are not enough to get your blood pressure under control, and if:  Your systolic blood pressure is 130 or higher.  Your diastolic blood pressure is 80 or higher. Take medicines only as told by your health care provider. Follow the directions carefully. Blood pressure medicines must be taken as prescribed. The medicine does not work as well when you skip doses. Skipping doses also puts you at risk for problems. Contact a health care provider if:  You think you are having a reaction to medicines you have taken.  You have repeated (recurrent) headaches.  You feel dizzy.  You have swelling in your ankles.  You have trouble with your vision. Get help right away if:  You develop a severe headache or confusion.  You have unusual weakness or numbness, or you feel faint.  You have severe pain in your chest or abdomen.  You vomit repeatedly.  You have trouble breathing. Summary  Hypertension is when the force of blood pumping  through your arteries is too strong. If this condition is not controlled, it may put you at risk for serious complications.  Your personal target blood pressure may vary depending on your medical conditions, your age, and other factors. For most people, a normal blood pressure is less than 120/80.  Hypertension is managed by lifestyle changes, medicines, or both. Lifestyle changes include weight loss, eating a healthy, low-sodium diet, exercising more, and limiting alcohol. This information is not intended to replace advice given to you by your health care provider. Make sure you discuss any questions you have with your health care provider. Document Revised: 03/20/2019 Document Reviewed: 10/24/2016 Elsevier Patient Education  [place].   Diabetes Mellitus and Nutrition, Adult When you have diabetes (diabetes mellitus), it is very important to have healthy eating habits because your blood sugar (glucose) levels are greatly affected by what you eat and drink. Eating healthy foods in the appropriate amounts, at about the same times every day, can help you:  Control your blood glucose.  Lower your risk of heart disease.  Improve your blood pressure.  Reach or maintain a healthy weight. Every person with diabetes is different, and each person has different needs for a meal plan. Your health care provider may recommend that you work  with a diet and nutrition specialist (dietitian) to make a meal plan that is best for you. Your meal plan may vary depending on factors such as:  The calories you need.  The medicines you take.  Your weight.  Your blood glucose, blood pressure, and cholesterol levels.  Your activity level.  Other health conditions you have, such as heart or kidney disease. How do carbohydrates affect me? Carbohydrates, also called carbs, affect your blood glucose level more than any other type of food. Eating carbs naturally raises the amount of glucose in your  blood. Carb counting is a method for keeping track of how many carbs you eat. Counting carbs is important to keep your blood glucose at a healthy level, especially if you use insulin or take certain oral diabetes medicines. It is important to know how many carbs you can safely have in each meal. This is different for every person. Your dietitian can help you calculate how many carbs you should have at each meal and for each snack. Foods that contain carbs include:  Bread, cereal, rice, pasta, and crackers.  Potatoes and corn.  Peas, beans, and lentils.  Milk and yogurt.  Fruit and juice.  Desserts, such as cakes, cookies, ice cream, and candy. How does alcohol affect me? Alcohol can cause a sudden decrease in blood glucose (hypoglycemia), especially if you use insulin or take certain oral diabetes medicines. Hypoglycemia can be a life-threatening condition. Symptoms of hypoglycemia (sleepiness, dizziness, and confusion) are similar to symptoms of having too much alcohol. If your health care provider says that alcohol is safe for you, follow these guidelines:  Limit alcohol intake to no more than 1 drink per day for nonpregnant women and 2 drinks per day for men. One drink equals 12 oz of beer, 5 oz of wine, or 1 oz of hard liquor.  Do not drink on an empty stomach.  Keep yourself hydrated with water, diet soda, or unsweetened iced tea.  Keep in mind that regular soda, juice, and other mixers may contain a lot of sugar and must be counted as carbs. What are tips for following this plan?  Reading food labels  Start by checking the serving size on the "Nutrition Facts" label of packaged foods and drinks. The amount of calories, carbs, fats, and other nutrients listed on the label is based on one serving of the item. Many items contain more than one serving per package.  Check the total grams (g) of carbs in one serving. You can calculate the number of servings of carbs in one serving by  dividing the total carbs by 15. For example, if a food has 30 g of total carbs, it would be equal to 2 servings of carbs.  Check the number of grams (g) of saturated and trans fats in one serving. Choose foods that have low or no amount of these fats.  Check the number of milligrams (mg) of salt (sodium) in one serving. Most people should limit total sodium intake to less than 2,300 mg per day.  Always check the nutrition information of foods labeled as "low-fat" or "nonfat". These foods may be higher in added sugar or refined carbs and should be avoided.  Talk to your dietitian to identify your daily goals for nutrients listed on the label. Shopping  Avoid buying canned, premade, or processed foods. These foods tend to be high in fat, sodium, and added sugar.  Shop around the outside edge of the grocery store. This includes fresh  fruits and vegetables, bulk grains, fresh meats, and fresh dairy. Cooking  Use low-heat cooking methods, such as baking, instead of high-heat cooking methods like deep frying.  Cook using healthy oils, such as olive, canola, or sunflower oil.  Avoid cooking with butter, cream, or high-fat meats. Meal planning  Eat meals and snacks regularly, preferably at the same times every day. Avoid going long periods of time without eating.  Eat foods high in fiber, such as fresh fruits, vegetables, beans, and whole grains. Talk to your dietitian about how many servings of carbs you can eat at each meal.  Eat 4-6 ounces (oz) of lean protein each day, such as lean meat, chicken, fish, eggs, or tofu. One oz of lean protein is equal to: ? 1 oz of meat, chicken, or fish. ? 1 egg. ?  cup of tofu.  Eat some foods each day that contain healthy fats, such as avocado, nuts, seeds, and fish. Lifestyle  Check your blood glucose regularly.  Exercise regularly as told by your health care provider. This may include: ? 150 minutes of moderate-intensity or vigorous-intensity  exercise each week. This could be brisk walking, biking, or water aerobics. ? Stretching and doing strength exercises, such as yoga or weightlifting, at least 2 times a week.  Take medicines as told by your health care provider.  Do not use any products that contain nicotine or tobacco, such as cigarettes and e-cigarettes. If you need help quitting, ask your health care provider.  Work with a Social worker or diabetes educator to identify strategies to manage stress and any emotional and social challenges. Questions to ask a health care provider  Do I need to meet with a diabetes educator?  Do I need to meet with a dietitian?  What number can I call if I have questions?  When are the best times to check my blood glucose? Where to find more information:  American Diabetes Association: diabetes.org  Academy of Nutrition and Dietetics: www.eatright.[employer] of Diabetes and Digestive and Kidney Diseases (NIH): [employer] Summary  A healthy meal plan will help you control your blood glucose and maintain a healthy lifestyle.  Working with a diet and nutrition specialist (dietitian) can help you make a meal plan that is best for you.  Keep in mind that carbohydrates (carbs) and alcohol have immediate effects on your blood glucose levels. It is important to count carbs and to use alcohol carefully. This information is not intended to replace advice given to you by your health care provider. Make sure you discuss any questions you have with your health care provider. Document Revised: 11/08/2017 Document Reviewed: 12/31/2016 Elsevier Patient Education  2020 [place].  Diabetes Mellitus and Skin Care Diabetes (diabetes mellitus) can lead to health problems over time, including skin problems. People with diabetes have a higher risk for many types of skin complications. This is because having poorly controlled blood sugar (glucose) levels can:  Damage nerves and blood  vessels. This can result in decreased feeling in your legs and feet, which means you may not notice minor skin injuries that could lead to serious problems.  Reduce blood flow (circulation), which makes wounds heal more slowly and increases your risk of infection.  Cause areas of skin to become thick or discolored. What are some common skin conditions that affect people with diabetes? Diabetes often causes dry skin. It can also cause the skin on the feet to get thinner, break more easily, and heal more slowly. There are  certain skin conditions that commonly affect people who have diabetes, such as:  Bacterial skin infections, such as styes, boils, infected hair follicles, and infections of the skin around the nails.  Fungal skin infections. These are most common in areas where skin rubs together, such as in the armpits or under the breasts.  Open sores, especially on the feet.  Tissue death (gangrene). This can happen on your feet if a serious infection does not heal properly. Gangrene can cause the need for a foot or leg to be surgically removed (amputated). Diabetes can also cause the skin to change. You may develop:  Dark, velvety markings on the skin that usually appear on the face, neck, armpits, inner thighs, and groin (acanthosis nigricans). This typically affects people of African-American and American-Indian descent.  Red, raised, scar-like tissue that may itch, feel painful, or develop into a wound (necrobiosis lipoidica).  Blisters on feet, toes, hands, or fingers.  Thickened, wax-like areas of skin that usually occur on the hands, forehead, or toes (digital sclerosis).  Brown or red ring-shaped or half-ring-shaped patches of skin on the ears or fingers (disseminated granuloma).  Pea-shaped yellow bumps that may be itchy and surrounded by a red ring (eruptive xanthomatosis). This usually affects the arms, feet, buttocks, and the top of the hands.  Round, discolored patches of  tan skin that do not hurt or itch (diabetic dermopathy). These may look like age spots. What do I need to know about itchy skin? It is common for people with diabetes to have itchy skin caused by dryness. Frequent high blood glucose levels can cause itchiness, and poor circulation and certain skin infections can make dry, itchy skin worse. If you have itchy skin that is red or covered in a rash, this could be a sign of an allergic reaction to a medicine. If you have a rash or if your skin is very itchy, contact your health care provider. You may need help to manage your diabetes better, or you may need treatment for an infection. How can I prevent skin breakdown? When you have diabetes and you get a badly infected ulcer or sore that does not heal, your skin can break down, especially if you have poor circulation or are on bed rest. To prevent skin breakdown:  Keep your skin clean and dry. Wash your skin often. Do not use hot water.  Do not use any products that contain nicotine or tobacco, such as cigarettes and e-cigarettes. Smoking affects the bodys ability to heal. If you need help quitting, ask your health care provider.  Check your skin every day for cuts, bruises, redness, blisters, or sores, especially on your feet. Tell your health care provider about any cuts, wounds, or sores you have, especially if they are healing slowly.  If you are on bed rest, try to change positions often. What else do I need to know about taking care of my skin?   To relieve dry skin and itching: ? Limit baths and showers to 5-10 minutes. ? Bathe with lukewarm water instead of hot water. ? Use mild soap and gentle skin cleansers. Do not use soap that is perfumed or harsh or dries your skin. ? Put on lotion as soon as you finish bathing.  Make sure that your health care provider performs a visual foot exam at every medical visit.  Schedule a foot exam with your health care provider once every year. This exam  includes an inspection of the structure and skin of your  feet.  If you get a skin injury, such as a cut, blister, or sore, check the area every day for signs of infection. Check for: ? More redness, swelling, or pain. ? More fluid or blood. ? Warmth. ? Pus or a bad smell. Contact a health care provider if:  You develop a cut or sore, especially on your feet.  You develop signs of infection after a skin injury.  Your blood glucose level is higher than 240 mg/dL (13.3 mmol/L) for 2 days in a row.  You have itchy skin that develops redness or a rash.  You have discolored areas of skin.  You have areas where your skin is changing, such as thickening or appearing shiny. Summary  Diabetes (diabetes mellitus) can lead to health problems over time, including skin problems.  People with diabetes have a higher risk for many types of skin complications.  Check your skin every day for cuts, bruises, redness, blisters, or sores, especially on your feet.  Tell your health care provider about any cuts, wounds, or sores you have, especially if they are healing slowly. This information is not intended to replace advice given to you by your health care provider. Make sure you discuss any questions you have with your health care provider. Document Revised: 06/20/2017 Document Reviewed: 05/08/2016 Elsevier Patient Education  [place] Heart-healthy meal planning includes:  Eating less unhealthy fats.  Eating more healthy fats.  Making other changes in your diet. Talk with your doctor or a diet specialist (dietitian) to create an eating plan that is right for you. What is my plan? Your doctor may recommend an eating plan that includes:  Total fat: ______% or less of total calories a day.  Saturated fat: ______% or less of total calories a day.  Cholesterol: less than _________mg a day. What are tips for following this plan? Cooking Avoid frying  your food. Try to bake, boil, grill, or broil it instead. You can also reduce fat by:  Removing the skin from poultry.  Removing all visible fats from meats.  Steaming vegetables in water or broth. Meal planning   At meals, divide your plate into four equal parts: ? Fill one-half of your plate with vegetables and green salads. ? Fill one-fourth of your plate with whole grains. ? Fill one-fourth of your plate with lean protein foods.  Eat 4-5 servings of vegetables per day. A serving of vegetables is: ? 1 cup of raw or cooked vegetables. ? 2 cups of raw leafy greens.  Eat 4-5 servings of fruit per day. A serving of fruit is: ? 1 medium whole fruit. ?  cup of dried fruit. ?  cup of fresh, frozen, or canned fruit. ?  cup of 100% fruit juice.  Eat more foods that have soluble fiber. These are apples, broccoli, carrots, beans, peas, and barley. Try to get 20-30 g of fiber per day.  Eat 4-5 servings of nuts, legumes, and seeds per week: ? 1 serving of dried beans or legumes equals  cup after being cooked. ? 1 serving of nuts is  cup. ? 1 serving of seeds equals 1 tablespoon. General information  Eat more home-cooked food. Eat less restaurant, buffet, and fast food.  Limit or avoid alcohol.  Limit foods that are high in starch and sugar.  Avoid fried foods.  Lose weight if you are overweight.  Keep track of how much salt (sodium) you eat. This is important if you have  high blood pressure. Ask your doctor to tell you more about this.  Try to add vegetarian meals each week. Fats  Choose healthy fats. These include olive oil and canola oil, flaxseeds, walnuts, almonds, and seeds.  Eat more omega-3 fats. These include salmon, mackerel, sardines, tuna, flaxseed oil, and ground flaxseeds. Try to eat fish at least 2 times each week.  Check food labels. Avoid foods with trans fats or high amounts of saturated fat.  Limit saturated fats. ? These are often found in animal  products, such as meats, butter, and cream. ? These are also found in plant foods, such as palm oil, palm kernel oil, and coconut oil.  Avoid foods with partially hydrogenated oils in them. These have trans fats. Examples are stick margarine, some tub margarines, cookies, crackers, and other baked goods. What foods can I eat? Fruits All fresh, canned (in natural juice), or frozen fruits. Vegetables Fresh or frozen vegetables (raw, steamed, roasted, or grilled). Green salads. Grains Most grains. Choose whole wheat and whole grains most of the time. Rice and pasta, including brown rice and pastas made with whole wheat. Meats and other proteins Lean, well-trimmed beef, veal, pork, and lamb. Chicken and [place] without skin. All fish and shellfish. Wild duck, rabbit, pheasant, and venison. Egg whites or low-cholesterol egg substitutes. Dried beans, peas, lentils, and tofu. Seeds and most nuts. Dairy Low-fat or nonfat cheeses, including ricotta and mozzarella. Skim or 1% milk that is liquid, powdered, or evaporated. Buttermilk that is made with low-fat milk. Nonfat or low-fat yogurt. Fats and oils Non-hydrogenated (trans-free) margarines. Vegetable oils, including soybean, sesame, sunflower, olive, peanut, safflower, corn, canola, and cottonseed. Salad dressings or mayonnaise made with a vegetable oil. Beverages Mineral water. Coffee and tea. Diet carbonated beverages. Sweets and desserts Sherbet, gelatin, and fruit ice. Small amounts of dark chocolate. Limit all sweets and desserts. Seasonings and condiments All seasonings and condiments. The items listed above may not be a complete list of foods and drinks you can eat. Contact a dietitian for more options. What foods should I avoid? Fruits Canned fruit in heavy syrup. Fruit in cream or butter sauce. Fried fruit. Limit coconut. Vegetables Vegetables cooked in cheese, cream, or butter sauce. Fried vegetables. Grains Breads that are made with  saturated or trans fats, oils, or whole milk. Croissants. Sweet rolls. Donuts. High-fat crackers, such as cheese crackers. Meats and other proteins Fatty meats, such as hot dogs, ribs, sausage, bacon, rib-eye roast or steak. High-fat deli meats, such as salami and bologna. Caviar. Domestic duck and goose. Organ meats, such as liver. Dairy Cream, sour cream, cream cheese, and creamed cottage cheese. Whole-milk cheeses. Whole or 2% milk that is liquid, evaporated, or condensed. Whole buttermilk. Cream sauce or high-fat cheese sauce. Yogurt that is made from whole milk. Fats and oils Meat fat, or shortening. Cocoa butter, hydrogenated oils, palm oil, coconut oil, palm kernel oil. Solid fats and shortenings, including bacon fat, salt pork, lard, and butter. Nondairy cream substitutes. Salad dressings with cheese or sour cream. Beverages Regular sodas and juice drinks with added sugar. Sweets and desserts Frosting. Pudding. Cookies. Cakes. Pies. Milk chocolate or white chocolate. Buttered syrups. Full-fat ice cream or ice cream drinks. The items listed above may not be a complete list of foods and drinks to avoid. Contact a dietitian for more information. Summary  Heart-healthy meal planning includes eating less unhealthy fats, eating more healthy fats, and making other changes in your diet.  Eat a balanced diet. This includes  fruits and vegetables, low-fat or nonfat dairy, lean protein, nuts and legumes, whole grains, and heart-healthy oils and fats. This information is not intended to replace advice given to you by your health care provider. Make sure you discuss any questions you have with your health care provider. Document Revised: 01/30/2018 Document Reviewed: 01/03/2018 Elsevier Patient Education  2020 [employer].

## 2020-05-31 LAB — CBC WITH DIFFERENTIAL/PLATELET
Basophils Absolute: 0.1 10*3/uL (ref 0.0–0.2)
Basos: 1 %
EOS (ABSOLUTE): 0.1 10*3/uL (ref 0.0–0.4)
Eos: 2 %
Hematocrit: 47.6 % (ref 37.5–51.0)
Hemoglobin: 15.9 g/dL (ref 13.0–17.7)
Immature Grans (Abs): 0 10*3/uL (ref 0.0–0.1)
Immature Granulocytes: 0 %
Lymphocytes Absolute: 1.2 10*3/uL (ref 0.7–3.1)
Lymphs: 17 %
MCH: 29.9 pg (ref 26.6–33.0)
MCHC: 33.4 g/dL (ref 31.5–35.7)
MCV: 90 fL (ref 79–97)
Monocytes Absolute: 0.8 10*3/uL (ref 0.1–0.9)
Monocytes: 11 %
Neutrophils Absolute: 4.8 10*3/uL (ref 1.4–7.0)
Neutrophils: 69 %
Platelets: 171 10*3/uL (ref 150–450)
RBC: 5.32 x10E6/uL (ref 4.14–5.80)
RDW: 12 % (ref 11.6–15.4)
WBC: 7 10*3/uL (ref 3.4–10.8)

## 2020-05-31 LAB — LIPID PANEL
Chol/HDL Ratio: 3.4 ratio (ref 0.0–5.0)
Cholesterol, Total: 141 mg/dL (ref 100–199)
HDL: 41 mg/dL (ref >39)
LDL Chol Calc (NIH): 84 mg/dL (ref 0–99)
Triglycerides: 84 mg/dL (ref 0–149)
VLDL Cholesterol Cal: 16 mg/dL (ref 5–40)

## 2020-05-31 LAB — COMPREHENSIVE METABOLIC PANEL
ALT: 21 IU/L (ref 0–44)
AST: 21 IU/L (ref 0–40)
Albumin/Globulin Ratio: 1.7 (ref 1.2–2.2)
Albumin: 4.6 g/dL (ref 3.6–4.6)
Alkaline Phosphatase: 71 IU/L (ref 48–121)
BUN/Creatinine Ratio: 24 (ref 10–24)
BUN: 16 mg/dL (ref 8–27)
Bilirubin Total: 1 mg/dL (ref 0.0–1.2)
CO2: 25 mmol/L (ref 20–29)
Calcium: 9.3 mg/dL (ref 8.6–10.2)
Chloride: 101 mmol/L (ref 96–106)
Creatinine, Ser: 0.67 mg/dL — ABNORMAL LOW (ref 0.76–1.27)
GFR calc Af Amer: 101 mL/min/{1.73_m2} (ref >59)
GFR calc non Af Amer: 87 mL/min/{1.73_m2} (ref >59)
Globulin, Total: 2.7 g/dL (ref 1.5–4.5)
Glucose: 133 mg/dL — ABNORMAL HIGH (ref 65–99)
Potassium: 4.6 mmol/L (ref 3.5–5.2)
Sodium: 141 mmol/L (ref 134–144)
Total Protein: 7.3 g/dL (ref 6.0–8.5)

## 2020-05-31 LAB — MICROALBUMIN, URINE: Microalbumin, Urine: 96.5 ug/mL

## 2020-06-01 ENCOUNTER — Telehealth: Payer: Self-pay

## 2020-06-01 NOTE — Telephone Encounter (Signed)
-----   Message from [place], [place] sent at 05/31/2020  5:35 PM EDT ----- Final report of urine microalbumin shows need to continue Ramipril (Altace) 5 mg qd to try to protect kidney function from diabetes damage. Follow up in October as planned.

## 2020-06-01 NOTE — Telephone Encounter (Signed)
-----   Message from Margo Common, [place] sent at 05/31/2020  9:13 AM EDT ----- Preliminary report of blood tests are essentially normal with blood sugar 133. Continue present medications and recheck as planned.

## 2020-06-01 NOTE — Telephone Encounter (Signed)
Patient has been advised. KW 

## 2020-06-23 ENCOUNTER — Ambulatory Visit: Payer: PPO | Admitting: Podiatry

## 2020-06-23 ENCOUNTER — Encounter: Payer: Self-pay | Admitting: Podiatry

## 2020-06-23 ENCOUNTER — Other Ambulatory Visit: Payer: Self-pay

## 2020-06-23 DIAGNOSIS — E1122 Type 2 diabetes mellitus with diabetic chronic kidney disease: Secondary | ICD-10-CM

## 2020-06-23 DIAGNOSIS — M79674 Pain in right toe(s): Secondary | ICD-10-CM | POA: Diagnosis not present

## 2020-06-23 DIAGNOSIS — M79675 Pain in left toe(s): Secondary | ICD-10-CM

## 2020-06-23 DIAGNOSIS — N181 Chronic kidney disease, stage 1: Secondary | ICD-10-CM

## 2020-06-23 DIAGNOSIS — B351 Tinea unguium: Secondary | ICD-10-CM

## 2020-06-23 NOTE — Progress Notes (Signed)
This patient returns to my office for at risk foot care.  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 This patient is unable to cut nails himself since the patient cannot reach his nails.These nails are painful walking and wearing shoes.  This patient presents for at risk foot care today.  General Appearance  Alert, conversant and in no acute stress.  Vascular  Dorsalis pedis and posterior tibial  pulses are palpable  bilaterally.  Capillary return is within normal limits  bilaterally. Temperature is within normal limits  bilaterally.  Neurologic  Senn-Weinstein monofilament wire test within normal limits  bilaterally. Muscle power within normal limits bilaterally.  Nails Thick disfigured discolored nails with subungual debris  from hallux to fifth toes bilaterally. No evidence of bacterial infection or drainage bilaterally.  Orthopedic  No limitations of motion  feet .  No crepitus or effusions noted.  No bony pathology or digital deformities noted.  Skin  normotropic skin with no porokeratosis noted bilaterally.  No signs of infections or ulcers noted.     Onychomycosis  Pain in right toes  Pain in left toes  Consent was obtained for treatment procedures.   Mechanical debridement of nails 1-5  bilaterally performed with a nail nipper.  Filed with dremel without incident.    Return office visit   3 months                   Told patient to return for periodic foot care and evaluation due to potential at risk complications.   Tyrea Froberg DPM  

## 2020-08-05 ENCOUNTER — Other Ambulatory Visit: Payer: Self-pay | Admitting: Cardiology

## 2020-08-18 ENCOUNTER — Telehealth: Payer: PPO | Admitting: Family Medicine

## 2020-09-01 ENCOUNTER — Other Ambulatory Visit: Payer: Self-pay

## 2020-09-01 ENCOUNTER — Telehealth (INDEPENDENT_AMBULATORY_CARE_PROVIDER_SITE_OTHER): Payer: PPO | Admitting: Family Medicine

## 2020-09-01 ENCOUNTER — Encounter: Payer: Self-pay | Admitting: Family Medicine

## 2020-09-01 ENCOUNTER — Ambulatory Visit: Payer: PPO | Admitting: Podiatry

## 2020-09-01 ENCOUNTER — Encounter: Payer: Self-pay | Admitting: Podiatry

## 2020-09-01 VITALS — BP 140/85 | HR 90 | Temp 97.9°F | Wt 176.0 lb

## 2020-09-01 DIAGNOSIS — B351 Tinea unguium: Secondary | ICD-10-CM | POA: Diagnosis not present

## 2020-09-01 DIAGNOSIS — N181 Chronic kidney disease, stage 1: Secondary | ICD-10-CM

## 2020-09-01 DIAGNOSIS — E1122 Type 2 diabetes mellitus with diabetic chronic kidney disease: Secondary | ICD-10-CM

## 2020-09-01 DIAGNOSIS — E785 Hyperlipidemia, unspecified: Secondary | ICD-10-CM

## 2020-09-01 DIAGNOSIS — M79674 Pain in right toe(s): Secondary | ICD-10-CM

## 2020-09-01 DIAGNOSIS — D689 Coagulation defect, unspecified: Secondary | ICD-10-CM

## 2020-09-01 DIAGNOSIS — M79675 Pain in left toe(s): Secondary | ICD-10-CM

## 2020-09-01 DIAGNOSIS — I2510 Atherosclerotic heart disease of native coronary artery without angina pectoris: Secondary | ICD-10-CM

## 2020-09-01 DIAGNOSIS — Z9861 Coronary angioplasty status: Secondary | ICD-10-CM | POA: Diagnosis not present

## 2020-09-01 DIAGNOSIS — Z23 Encounter for immunization: Secondary | ICD-10-CM

## 2020-09-01 NOTE — Progress Notes (Signed)
This patient returns to my office for at risk foot care.  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  Patient is taking plavix.  This patient is unable to cut nails himself since the patient cannot reach his nails.These nails are painful walking and wearing shoes.  This patient presents for at risk foot care today.  General Appearance  Alert, conversant and in no acute stress.  Vascular  Dorsalis pedis and posterior tibial  pulses are palpable  bilaterally.  Capillary return is within normal limits  bilaterally. Temperature is within normal limits  bilaterally.  Neurologic  Senn-Weinstein monofilament wire test within normal limits  bilaterally. Muscle power within normal limits bilaterally.  Nails Thick disfigured discolored nails with subungual debris  from hallux to fifth toes bilaterally. No evidence of bacterial infection or drainage bilaterally.  Orthopedic  No limitations of motion  feet .  No crepitus or effusions noted.  No bony pathology or digital deformities noted.  Skin  normotropic skin with no porokeratosis noted bilaterally.  No signs of infections or ulcers noted.     Onychomycosis  Pain in right toes  Pain in left toes  Consent was obtained for treatment procedures.   Mechanical debridement of nails 1-5  bilaterally performed with a nail nipper.  Filed with dremel without incident.    Return office visit   3 months                   Told patient to return for periodic foot care and evaluation due to potential at risk complications.   Gardiner Barefoot DPM

## 2020-09-01 NOTE — Progress Notes (Signed)
Established patient visit   Patient: Charles Sellers.   DOB: July 02, 1932   86 y.o. Male  MRN: [id number] Visit Date: 09/01/2020  Today's healthcare provider: [name], PA   Chief Complaint  Patient presents with  . Diabetes   Subjective    HPI   Diabetes Mellitus Type II, Follow-up  Lab Results  Component Value Date   HGBA1C 7.0 (A) 05/30/2020   HGBA1C 6.9 (H) 01/26/2020   HGBA1C 6.5 (H) 10/01/2019   Wt Readings from Last 3 Encounters:  09/01/20 176 lb (79.8 kg)  05/30/20 176 lb (79.8 kg)  01/26/20 176 lb (79.8 kg)   Last seen for diabetes 3 months ago.  Management since then includes none. He reports good compliance with treatment. He is not having side effects.  Symptoms: No fatigue No foot ulcerations  No appetite changes No nausea  No paresthesia of the feet  No polydipsia  No polyuria No visual disturbances   No vomiting     Home blood sugar records: fasting range: 140's-190's  Episodes of hypoglycemia? No     Pertinent Labs: Lab Results  Component Value Date   CHOL 141 05/30/2020   HDL 41 05/30/2020   LDLCALC 84 05/30/2020   TRIG 84 05/30/2020   CHOLHDL 3.4 05/30/2020   Lab Results  Component Value Date   NA 141 05/30/2020   K 4.6 05/30/2020   CREATININE 0.67 (L) 05/30/2020   GFRNONAA 87 05/30/2020   GFRAA 101 05/30/2020   GLUCOSE 133 (H) 05/30/2020     ---------------------------------------------------------------------------------------------------   Past Medical History:  Diagnosis Date  . Atherosclerosis   . CAD (coronary artery disease) 2011 & 2009   Echo, EF =>55% 2011/ Echo, EF =>55% 2009  . CAD S/P percutaneous coronary angioplasty 12/13 & 15 2005   PCI - mRCA (Cypher DES 3.5 mm x 18 mm --> 4.19mm); RPL Cypher 2.5 mm x 13 mm;; mLAD Cy a pher DES 3.0 mm x 13 mm (3.5 mm) with Cutting PTCA of D2 ostium;; mid Cx 100% after small OM1.  . Cholecystitis   . Dermatitis   . Diabetes mellitus type II, controlled ([facility])     Diet controlled. No medications; he indicates "borderline diabetes"  . Dyslipidemia, goal LDL below 70     on statin  . Hypertension   . Leaky heart valve   . Prostate nodule   . Ventral hernia    Past Surgical History:  Procedure Laterality Date  . APPENDECTOMY    . CARDIAC CATHETERIZATION  2005  . Carotid Dopplers  September 2014   Bilateral internal carotids < 49%.  . CORONARY ANGIOPLASTY WITH STENT PLACEMENT     mRCA Cypher DES 3.5 mm x 18 mm (4.0), RPL Cypher DES 2.5 mm x 13 mm; mLAD 3.0 mm x 13 mm Cypher -> cutting PTCA of jailed D2; previousl PCI of Cx - 100% mid Cx.  Marland Kitchen NM MYOVIEW LTD  06/2012   Small area of basal inferior infarct with no ischemia. Normal EF  . Stress Myoview  2013   normal findings  . TRANSTHORACIC ECHOCARDIOGRAM  February 2014    EF 55%. Mild-moderate MR   Social History   Tobacco Use  . Smoking status: Never Smoker  . Smokeless tobacco: Never Used  Vaping Use  . Vaping Use: Never used  Substance Use Topics  . Alcohol use: No    Alcohol/week: 0.0 standard drinks  . Drug use: No   Family Status  Relation Name  Status  . Mother  Deceased at age 76  . Father  Deceased at age 39  . Brother  Deceased  . Daughter  Alive  . Daughter  Alive  . Son 1 Alive  . Son 2 Alive   Allergies  Allergen Reactions  . Niaspan [Niacin Er] Rash  . Penicillins Rash   Medications: Outpatient Medications Prior to Visit  Medication Sig  . APPLE CIDER VINEGAR PO Take by mouth daily.   . cholecalciferol (VITAMIN D) 1000 UNITS tablet Take 1,000 Units by mouth daily.  . clopidogrel (PLAVIX) 75 MG tablet TAKE 1 TABLET BY MOUTH DAILY  . metFORMIN (GLUCOPHAGE) 500 MG tablet Take 1 tablet (500 mg total) by mouth daily with breakfast.  . metoprolol succinate (TOPROL-XL) 25 MG 24 hr tablet Take 0.5 tablets (12.5 mg total) by mouth daily.  . Multiple Vitamin (MULTIVITAMIN) tablet Take 1 tablet by mouth daily.  . Multiple Vitamins-Minerals (ICAPS AREDS 2 PO) Take 1 capsule  by mouth daily.  . naproxen sodium (ANAPROX) 220 MG tablet Take 220 mg by mouth daily as needed.   . nitroGLYCERIN (NITROSTAT) 0.4 MG SL tablet Place 1 tablet (0.4 mg total) under the tongue every 5 (five) minutes as needed for chest pain.  . Omega-3 Fatty Acids (FISH OIL) 1000 MG CAPS Take 1,000 mg by mouth daily.  Glory Rosebush DELICA LANCETS FINE MISC One finger stick daily for fasting blood sugar check.  Glory Rosebush ULTRA test strip USE EVERY DAY AS DIRECTED  . pantoprazole (PROTONIX) 40 MG tablet TAKE 1 TABLET ON MON WED AND FRI OF EACHWEEK  . ramipril (ALTACE) 5 MG capsule TAKE 1 CAPSULE BY MOUTH ONCE DAILY  . rosuvastatin (CRESTOR) 10 MG tablet TAKE 1 TABLET BY MOUTH DAILY   No facility-administered medications prior to visit.    Review of Systems  Constitutional: Negative.   HENT: Negative.   Eyes: Negative.   Respiratory: Negative.   Cardiovascular: Negative.   Gastrointestinal: Negative.   Genitourinary: Negative.   Psychiatric/Behavioral: Negative.       Objective    BP 140/85 (BP Location: Right Arm, Patient Position: Sitting, Cuff Size: Normal)   Pulse 90   Temp 97.9 F (36.6 C) (Oral)   Wt 176 lb (79.8 kg)   SpO2 98%   BMI 26.76 kg/m  BP Readings from Last 3 Encounters:  09/01/20 140/85  05/30/20 122/60  01/26/20 110/70   Physical Exam Constitutional:      General: He is not in acute distress.    Appearance: He is well-developed.  HENT:     Head: Normocephalic and atraumatic.     Right Ear: Hearing normal.     Left Ear: Hearing normal.     Nose: Nose normal.  Eyes:     General: Lids are normal. No scleral icterus.       Right eye: No discharge.        Left eye: No discharge.     Conjunctiva/sclera: Conjunctivae normal.  Cardiovascular:     Rate and Rhythm: Normal rate and regular rhythm.     Heart sounds: Normal heart sounds.  Pulmonary:     Effort: Pulmonary effort is normal. No respiratory distress.     Breath sounds: Normal breath sounds.    Musculoskeletal:        General: Normal range of motion.     Cervical back: Neck supple.  Skin:    Findings: No lesion or rash.  Neurological:     Mental Status: He is  alert and oriented to person, place, and time.  Psychiatric:        Speech: Speech normal.        Behavior: Behavior normal.        Thought Content: Thought content normal.     Diabetic Foot Form - Detailed   Diabetic Foot Exam - detailed Diabetic Foot exam was performed with the following findings: Yes 09/01/2020 10:58 AM  Visual Foot Exam completed.: Yes  Can the patient see the bottom of their feet?: Yes Are the shoes appropriate in style and fit?: Yes Is there swelling or and abnormal foot shape?: No Is there a claw toe deformity?: No Is there elevated skin temparature?: No Is there foot or ankle muscle weakness?: No Normal Range of Motion: Yes Pulse Foot Exam completed.: Yes  Right posterior Tibialias: Present Left posterior Tibialias: Present  Right Dorsalis Pedis: Present Left Dorsalis Pedis: Present  Sensory Foot Exam Completed.: Yes Semmes-Weinstein Monofilament Test R Site 1-Great Toe: Pos L Site 1-Great Toe: Pos         No results found for any visits on 09/01/20.  Assessment & Plan     1. Controlled type 2 diabetes mellitus with stage 1 chronic kidney disease, without long-term current use of insulin (HCC) FBS in the 120-170 range at home per glucometer. Still taking Metformin 500 mg q am and trying to follow a diabetic diet. Recheck labs and follow up pending reports.  - CBC with Differential/Platelet - Comprehensive metabolic panel - Hemoglobin A1c - Lipid panel  2. Dyslipidemia, goal LDL below 70 Tolerating Crestor 10 mg qd without side effects. Recheck labs. - Comprehensive metabolic panel - Lipid panel  3. CAD S/P percutaneous coronary angioplasty Stable without chest pains, palpitations, dyspnea or edema. Scheduled for cardiology follow up with Dr. Ellyn Hack in 4-6 weeks.  4.Pain  due to onychomycosis of toenails of both feet  Schedule for follow up with Dr. Prudence Davidson (podiatrist) today. No follow-ups on file.      Andres Shad, PA, have reviewed all documentation for this visit. The documentation on 09/01/20 for the exam, diagnosis, procedures, and orders are all accurate and complete.    [name], [place] [phone] (phone) [phone] (fax)  [place]

## 2020-09-02 LAB — CBC WITH DIFFERENTIAL/PLATELET
Basophils Absolute: 0 10*3/uL (ref 0.0–0.2)
Basos: 1 %
EOS (ABSOLUTE): 0.1 10*3/uL (ref 0.0–0.4)
Eos: 1 %
Hematocrit: 43.8 % (ref 37.5–51.0)
Hemoglobin: 15 g/dL (ref 13.0–17.7)
Immature Grans (Abs): 0 10*3/uL (ref 0.0–0.1)
Immature Granulocytes: 0 %
Lymphocytes Absolute: 1.2 10*3/uL (ref 0.7–3.1)
Lymphs: 19 %
MCH: 29.9 pg (ref 26.6–33.0)
MCHC: 34.2 g/dL (ref 31.5–35.7)
MCV: 87 fL (ref 79–97)
Monocytes Absolute: 0.7 10*3/uL (ref 0.1–0.9)
Monocytes: 11 %
Neutrophils Absolute: 4.2 10*3/uL (ref 1.4–7.0)
Neutrophils: 68 %
Platelets: 157 10*3/uL (ref 150–450)
RBC: 5.01 x10E6/uL (ref 4.14–5.80)
RDW: 12.6 % (ref 11.6–15.4)
WBC: 6.3 10*3/uL (ref 3.4–10.8)

## 2020-09-02 LAB — COMPREHENSIVE METABOLIC PANEL
ALT: 18 IU/L (ref 0–44)
AST: 17 IU/L (ref 0–40)
Albumin/Globulin Ratio: 1.8 (ref 1.2–2.2)
Albumin: 4.7 g/dL — ABNORMAL HIGH (ref 3.6–4.6)
Alkaline Phosphatase: 65 IU/L (ref 44–121)
BUN/Creatinine Ratio: 23 (ref 10–24)
BUN: 16 mg/dL (ref 8–27)
Bilirubin Total: 1.1 mg/dL (ref 0.0–1.2)
CO2: 25 mmol/L (ref 20–29)
Calcium: 9.2 mg/dL (ref 8.6–10.2)
Chloride: 103 mmol/L (ref 96–106)
Creatinine, Ser: 0.69 mg/dL — ABNORMAL LOW (ref 0.76–1.27)
GFR calc Af Amer: 99 mL/min/{1.73_m2} (ref >59)
GFR calc non Af Amer: 86 mL/min/{1.73_m2} (ref >59)
Globulin, Total: 2.6 g/dL (ref 1.5–4.5)
Glucose: 135 mg/dL — ABNORMAL HIGH (ref 65–99)
Potassium: 4.3 mmol/L (ref 3.5–5.2)
Sodium: 141 mmol/L (ref 134–144)
Total Protein: 7.3 g/dL (ref 6.0–8.5)

## 2020-09-02 LAB — LIPID PANEL
Chol/HDL Ratio: 3.4 ratio (ref 0.0–5.0)
Cholesterol, Total: 129 mg/dL (ref 100–199)
HDL: 38 mg/dL — ABNORMAL LOW (ref >39)
LDL Chol Calc (NIH): 78 mg/dL (ref 0–99)
Triglycerides: 60 mg/dL (ref 0–149)
VLDL Cholesterol Cal: 13 mg/dL (ref 5–40)

## 2020-09-02 LAB — HEMOGLOBIN A1C
Est. average glucose Bld gHb Est-mCnc: 169 mg/dL
Hgb A1c MFr Bld: 7.5 % — ABNORMAL HIGH (ref 4.8–5.6)

## 2020-09-02 NOTE — Addendum Note (Signed)
Addended by: Althea Charon D on: 09/02/2020 08:31 AM   Modules accepted: Orders

## 2020-09-07 ENCOUNTER — Telehealth: Payer: Self-pay

## 2020-09-07 NOTE — Telephone Encounter (Signed)
Mailbox is full and can not take message.  OK for triage to give patient information

## 2020-09-07 NOTE — Telephone Encounter (Signed)
-----   Message from Margo [place], [place] sent at 09/02/2020 10:31 AM EDT ----- Normal kidney and liver function. No anemia. Hgb A1C climbing a little (was 7.0 - now 7.5) and FBS 135. HDL slightly low at 38 (goal is above 39). Continue medications and watch fats in diabetic diet. Recheck in 4 months.

## 2020-09-15 NOTE — Telephone Encounter (Signed)
Tried calling patient. No answer. Unable to leave message due to voice mailbox full.

## 2020-09-22 ENCOUNTER — Ambulatory Visit: Payer: PPO | Admitting: Cardiology

## 2020-09-30 ENCOUNTER — Ambulatory Visit: Payer: Self-pay | Admitting: Family Medicine

## 2020-10-11 ENCOUNTER — Ambulatory Visit: Payer: PPO | Admitting: Cardiology

## 2020-10-11 ENCOUNTER — Other Ambulatory Visit: Payer: Self-pay

## 2020-10-11 ENCOUNTER — Encounter: Payer: Self-pay | Admitting: Cardiology

## 2020-10-11 VITALS — BP 140/88 | HR 85 | Ht 67.0 in | Wt 178.6 lb

## 2020-10-11 DIAGNOSIS — I4891 Unspecified atrial fibrillation: Secondary | ICD-10-CM

## 2020-10-11 DIAGNOSIS — E785 Hyperlipidemia, unspecified: Secondary | ICD-10-CM

## 2020-10-11 DIAGNOSIS — I4821 Permanent atrial fibrillation: Secondary | ICD-10-CM | POA: Insufficient documentation

## 2020-10-11 DIAGNOSIS — I2510 Atherosclerotic heart disease of native coronary artery without angina pectoris: Secondary | ICD-10-CM | POA: Diagnosis not present

## 2020-10-11 DIAGNOSIS — E1169 Type 2 diabetes mellitus with other specified complication: Secondary | ICD-10-CM | POA: Diagnosis not present

## 2020-10-11 DIAGNOSIS — Z9861 Coronary angioplasty status: Secondary | ICD-10-CM

## 2020-10-11 DIAGNOSIS — I350 Nonrheumatic aortic (valve) stenosis: Secondary | ICD-10-CM

## 2020-10-11 DIAGNOSIS — I10 Essential (primary) hypertension: Secondary | ICD-10-CM | POA: Diagnosis not present

## 2020-10-11 DIAGNOSIS — I340 Nonrheumatic mitral (valve) insufficiency: Secondary | ICD-10-CM

## 2020-10-11 HISTORY — DX: Permanent atrial fibrillation: I48.21

## 2020-10-11 MED ORDER — APIXABAN 5 MG PO TABS: 5.0000 mg | ORAL_TABLET | Freq: Two times a day (BID) | ORAL | 3 refills | Status: DC

## 2020-10-11 NOTE — Progress Notes (Signed)
Primary Care Provider: Margo Common, PA Cardiologist: Glenetta Hew, MD Electrophysiologist: None  Clinic Note: Chief Complaint  Patient presents with  . Follow-up    1 year.  Doing well.  . Coronary Artery Disease    No angina  . Cardiac Valve Problem    Has mitral regurgitation and mild aortic stenosis.  No symptoms.    HPI:    Charles Sellers. is a 86 y.o. male with a PMH notable for CAD having PCI and CRF noted below who presents today for annual follow-up.  Problem List Items Addressed This Visit    Hyperlipidemia associated with type 2 diabetes mellitus ([place]) (Chronic)   Essential hypertension - Primary (Chronic)   CAD S/P percutaneous coronary angioplasty (Chronic)   Mitral regurgitation (Chronic)   Mild aortic stenosis (Chronic)     Charles Sellers is a former patient of Dr. Terance Ice with CAD and moderate mitral regurgitation.  December 2005: Staged two-vessel DES PCI -->initial PCI of the mid RCA with a 3.5 x 18 mm Cypher DES (-> 4.0 mm),  RPL - Cypher DES 2.5 mm x 13 mm  Staged PCI of the LAD at D2 with a Cypher DES 3.0 mm 30 mm (-> 3.50mm) w/ provisional PTCA of D2 ostium 2/2 plaque shift.   CTO dCx ==> Med Rx.  ? Follow-up Myoviews (most recent June 2013) --> nonischemic.  Small basal inferior infarct.  TTE 08/07/2018:Normal LV function EF 55 to 60%. No R WMA. GR 1 DD. Mild aortic stenosis with trivial regurgitation. Moderate mitral regurgitation with mildly dilated left atrium.   Plan is to hold off on follow-up echocardiograms unless murmurs worsen.  Coulson Wehner. was last seen on September 23, 2019 doing very well.  Very active and in great spirits.  Enjoys gardening and mowing the lawn.  Enjoys weed whacking.  No cardiac complaints.  No heart failure symptoms of daily, orthopnea or edema.  Recent Hospitalizations: none  Reviewed  CV studies:    The following studies were reviewed today: (if available, images/films reviewed: From Epic  Chart or Care Everywhere) . none:   Interval History:   Charles Sellers. returns here today (actually scheduled on the same day as his wife, therefore they are both accompanied by their daughter).  He is doing well.  He has no major complaints.  He is still extremely active, in fact he is daughter begged with me to please ask him to slow down.  He is actually cutting wood with a chainsaw, doing yard work and walking.  He enjoys doing gardening.  He has had some balance issues, and while cutting wood with a chainsaw he actually stepped back and tripped over a log landing on his side and had a big bruise.  Otherwise he is not really having any major issues.  He had never really has had any sensation of exertional chest tightness or pressure, nor dyspnea.  He has not had any heart failure symptoms of PND, orthopnea or edema.  He sleeps on a pillow with a small wedge pillow, simply because of comfort.  He has been doing so for years.  Occasional palpitations but nothing prolonged more than a few seconds and nothing really bother him.  No lightheadedness or dizziness, syncope/near syncope or TIA/amaurosis fugax symptoms.  No sensation whatsoever irregular heartbeats or skipped beats.  The patient does not have symptoms concerning for COVID-19 infection (fever, chills, cough, or new shortness of breath).   REVIEWED OF SYSTEMS  Review of Systems  Constitutional: Negative for malaise/fatigue and weight loss (He actually put back on some of the weight that he had been losing.  Now stable.).  HENT: Negative for congestion and nosebleeds.   Respiratory: Negative for shortness of breath.   Gastrointestinal: Positive for heartburn. Negative for blood in stool and melena.  Genitourinary: Negative for hematuria.  Musculoskeletal: Positive for falls (See HPI). Negative for back pain.       He has a bruise where he landed after his fall, but no major issues.  Mild arthritis pains.  Neurological: Positive  for dizziness (He only notes this when he is dehydrated).       He is a little bit off balance  Psychiatric/Behavioral: Negative for depression and memory loss. The patient is not nervous/anxious and does not have insomnia (He has some insomnia, versus just not able to go back to sleep when he wakes up to urinate.).    I have reviewed and (if needed) personally updated the patient's problem list, medications, allergies, past medical and surgical history, social and family history.   PAST MEDICAL HISTORY   Past Medical History:  Diagnosis Date  . Atherosclerosis   . CAD (coronary artery disease) 2011 & 2009   Echo, EF =>55% 2011/ Echo, EF =>55% 2009  . CAD S/P percutaneous coronary angioplasty 12/13 & 15 2005   PCI - mRCA (Cypher DES 3.5 mm x 18 mm --> 4.22mm); RPL Cypher 2.5 mm x 13 mm;; mLAD Cy a pher DES 3.0 mm x 13 mm (3.5 mm) with Cutting PTCA of D2 ostium;; mid Cx 100% after small OM1.  . Cholecystitis   . Dermatitis   . Diabetes mellitus type II, controlled ([place])    Diet controlled. No medications; he indicates "borderline diabetes"  . Dyslipidemia, goal LDL below 70     on statin  . Hypertension   . Leaky heart valve   . Prostate nodule   . Ventral hernia     PAST SURGICAL HISTORY   Past Surgical History:  Procedure Laterality Date  . APPENDECTOMY    . CARDIAC CATHETERIZATION  2005  . Carotid Dopplers  September 2014   Bilateral internal carotids < 49%.  . CORONARY ANGIOPLASTY WITH STENT PLACEMENT     mRCA Cypher DES 3.5 mm x 18 mm (4.0), RPL Cypher DES 2.5 mm x 13 mm; mLAD 3.0 mm x 13 mm Cypher -> cutting PTCA of jailed D2; previousl PCI of Cx - 100% mid Cx.  Marland Kitchen NM MYOVIEW LTD  06/2012   Small area of basal inferior infarct with no ischemia. Normal EF  . Stress Myoview  2013   normal findings  . TRANSTHORACIC ECHOCARDIOGRAM  February 2014    EF 55%. Mild-moderate MR    Immunization History  Administered Date(s) Administered  . Fluad Quad(high Dose 65+) 10/20/2019,  09/01/2020  . Influenza, High Dose Seasonal PF 10/18/2017, 10/24/2018  . Pneumococcal Conjugate-13 01/09/2016  . Pneumococcal Polysaccharide-23 08/04/2008  . Tdap 01/09/2016  . Zoster Recombinat (Shingrix) 11/04/2019, 01/14/2020    MEDICATIONS/ALLERGIES   Current Meds  Medication Sig  . APPLE CIDER VINEGAR PO Take by mouth daily.   . cholecalciferol (VITAMIN D) 1000 UNITS tablet Take 1,000 Units by mouth daily.  . metFORMIN (GLUCOPHAGE) 500 MG tablet Take 1 tablet (500 mg total) by mouth daily with breakfast.  . metoprolol succinate (TOPROL-XL) 25 MG 24 hr tablet Take 0.5 tablets (12.5 mg total) by mouth daily.  . Multiple Vitamin (MULTIVITAMIN) tablet Take 1  tablet by mouth daily.  . Multiple Vitamins-Minerals (ICAPS AREDS 2 PO) Take 1 capsule by mouth daily.  . naproxen sodium (ANAPROX) 220 MG tablet Take 220 mg by mouth daily as needed.   . nitroGLYCERIN (NITROSTAT) 0.4 MG SL tablet Place 1 tablet (0.4 mg total) under the tongue every 5 (five) minutes as needed for chest pain.  . Omega-3 Fatty Acids (FISH OIL) 1000 MG CAPS Take 1,000 mg by mouth daily.  Glory Rosebush DELICA LANCETS FINE MISC One finger stick daily for fasting blood sugar check.  Glory Rosebush ULTRA test strip USE EVERY DAY AS DIRECTED  . pantoprazole (PROTONIX) 40 MG tablet TAKE 1 TABLET ON MON WED AND FRI OF EACHWEEK  . ramipril (ALTACE) 5 MG capsule TAKE 1 CAPSULE BY MOUTH ONCE DAILY  . rosuvastatin (CRESTOR) 10 MG tablet TAKE 1 TABLET BY MOUTH DAILY  . [DISCONTINUED] clopidogrel (PLAVIX) 75 MG tablet TAKE 1 TABLET BY MOUTH DAILY    Allergies  Allergen Reactions  . Niaspan [Niacin Er] Rash  . Penicillins Rash    SOCIAL HISTORY/FAMILY HISTORY   Reviewed in Epic:  Pertinent findings: No new changes  OBJCTIVE -PE, EKG, labs   Wt Readings from Last 3 Encounters:  10/11/20 178 lb 9.6 oz (81 kg)  09/01/20 176 lb (79.8 kg)  05/30/20 176 lb (79.8 kg)    Physical Exam: BP 140/88   Pulse 85   Ht 5\' 7"  (1.702  m)   Wt 178 lb 9.6 oz (81 kg)   SpO2 98%   BMI 27.97 kg/m  Physical Exam Vitals reviewed.  Constitutional:      General: He is not in acute distress.    Appearance: Normal appearance. He is normal weight. He is not ill-appearing or toxic-appearing.  HENT:     Head: Normocephalic and atraumatic.  Neck:     Vascular: No carotid bruit, hepatojugular reflux or JVD.  Cardiovascular:     Rate and Rhythm: Normal rate. Rhythm irregularly irregular.     Chest Wall: PMI is not displaced.     Pulses: Normal pulses.     Heart sounds: Murmur heard.  Medium-pitched harsh crescendo-decrescendo midsystolic murmur is present with a grade of 1/6 at the upper right sternal border radiating to the neck. High-pitched blowing holosystolic murmur of grade 2/6 is also present at the apex.  No friction rub. No gallop.   Pulmonary:     Effort: Pulmonary effort is normal. No respiratory distress.     Breath sounds: Normal breath sounds.  Chest:     Chest wall: No tenderness.  Musculoskeletal:        General: No swelling. Normal range of motion.     Cervical back: Normal range of motion and neck supple.  Skin:    General: Skin is warm and dry.  Neurological:     General: No focal deficit present.     Mental Status: He is alert and oriented to person, place, and time.  Psychiatric:        Mood and Affect: Mood normal.        Behavior: Behavior normal.        Thought Content: Thought content normal.        Judgment: Judgment normal.      Adult ECG Report  Rate: 85 ;  Rhythm: atrial fibrillation and Left axis deviation, RBBB, inferior mild, age undetermined.;   Narrative Interpretation: A. fib is new  Recent Labs:    Lab Results  Component Value Date  CHOL 129 09/01/2020   HDL 38 (L) 09/01/2020   LDLCALC 78 09/01/2020   TRIG 60 09/01/2020   CHOLHDL 3.4 09/01/2020   Lab Results  Component Value Date   CREATININE 0.69 (L) 09/01/2020   BUN 16 09/01/2020   NA 141 09/01/2020   K 4.3  09/01/2020   CL 103 09/01/2020   CO2 25 09/01/2020   Lab Results  Component Value Date   TSH 1.150 01/26/2020    ASSESSMENT/PLAN    Problem List Items Addressed This Visit    Hyperlipidemia associated with type 2 diabetes mellitus ([place]) (Chronic)    Most recent LDL was 78.  Close to goal given his age.  We will continue current dose of Crestor 10 mg.      Essential hypertension - Primary (Chronic)    Borderline elevated blood pressure today, but stable for him.  We will hold off on adjusting any further medications for now.  Continue current dose of Toprol plus ACE inhibitor.  If pressures do increase, would recommend increasing to 10 mg of ramipril.      Relevant Medications   apixaban (ELIQUIS) 5 MG TABS tablet   CAD S/P percutaneous coronary angioplasty (Chronic)    History of 2 stents in the RCA one of the LAD.  Has been 7 years out.  No further angina.  With new diagnosis of A. fib, we will convert from Plavix to DOAC.  Eliquis 5 mg twice daily On stable dose of beta-blocker, ACE inhibitor and statin. Lipids not quite at goal, but okay given his age.  Also on Invokana-SGLT2 inhibitor.      Relevant Medications   apixaban (ELIQUIS) 5 MG TABS tablet   Mitral regurgitation (Chronic)    With new diagnosis of A. fib, need to exclude progression of disease.  Will check 2D echo now.  Plan was to hold off for 2 more years until next year, but now with new diagnosis of A. fib we will check an echo now.      Relevant Medications   apixaban (ELIQUIS) 5 MG TABS tablet   Other Relevant Orders   ECHOCARDIOGRAM COMPLETE   Mild aortic stenosis (Chronic)    The plan was to follow-up echo in 3 years based on last echo results, however now with new diagnosis of A. fib, will need to assess valvular disease for potential progression of disease of aortic stenosis versus mitral regurgitation.      Relevant Medications   apixaban (ELIQUIS) 5 MG TABS tablet   Other Relevant Orders    ECHOCARDIOGRAM COMPLETE   New onset atrial fibrillation (H[facility])    Completely asymptomatic, rate controlled on current dose of Toprol. He is very active no anginal symptoms.  I do not see any reason for ischemic evaluation.  I will however check 2D echocardiogram since he has a diagnosis of mitral regurgitation.  Need to exclude worsening valvular AF.  Continue current dose of Toprol. This patients CHA2DS2-VASc Score and unadjusted Ischemic Stroke Rate (% per year) is equal to 7.2 % stroke rate/year from a score of 5 -> HTN, DM-2, vascular - 3, age 57 -2 -> Convert from Plavix 75 mg daily to Eliquis 5 mg twice daily      Relevant Medications   apixaban (ELIQUIS) 5 MG TABS tablet   Other Relevant Orders   EKG 12-Lead (Completed)   ECHOCARDIOGRAM COMPLETE       COVID-19 Education: The signs and symptoms of COVID-19 were discussed with the patient and how to  seek care for testing (follow up with PCP or arrange E-visit).   The importance of social distancing and COVID-19 vaccination was discussed today.  He was not sure which direction to go with getting the vaccine.  He was waiting to talk to me.  I spoke to both he and his daughter for least 5 minutes about this very issue.  I explained that I felt very strongly that he would benefit from being vaccinated both for his sake and his wife.  He is the one who is going out into public more so than she is.  He was concerned about potential risks, and I reiterated that the risks of getting COVID-19 infection are much greater than the risks from the vaccine. The patient is practicing social distancing & Masking.   I spent a total of 42 minutes with the patient spent in direct patient consultation.  Additional time spent with chart review  / charting (studies, outside notes, etc): 10 Total Time: 52 min  Current medicines are reviewed at length with the patient today.  (+/- concerns) n/a  This visit occurred during the SARS-CoV-2 public health  emergency.  Safety protocols were in place, including screening questions prior to the visit, additional usage of staff PPE, and extensive cleaning of exam room while observing appropriate contact time as indicated for disinfecting solutions.  Notice: This dictation was prepared with Dragon dictation along with smaller phrase technology. Any transcriptional errors that result from this process are unintentional and may not be corrected upon review.  Patient Instructions / Medication Changes & Studies & Tests Ordered   Patient Instructions  Medication Instructions:  Discontinue plavix and begin taking eliquis 5mg  twice daily.  *If you need a refill on your cardiac medications before your next appointment, please call your pharmacy*  Testing/Procedures: Your physician has requested that you have an echocardiogram. Echocardiography is a painless test that uses sound waves to create images of your heart. It provides your doctor with information about the size and shape of your heart and how well your heart's chambers and valves are working. This procedure takes approximately one hour. There are no restrictions for this procedure. At [facility] Cardiology office.   Follow-Up: At [facility], you and your health needs are our priority.  As part of our continuing mission to provide you with exceptional heart care, we have created designated Provider Care Teams.  These Care Teams include your primary Cardiologist (physician) and Advanced Practice Providers (APPs -  Physician Assistants and Nurse Practitioners) who all work together to provide you with the care you need, when you need it.  We recommend signing up for the patient portal called "MyChart".  Sign up information is provided on this After Visit Summary.  MyChart is used to connect with patients for Virtual Visits (Telemedicine).  Patients are able to view lab/test results, encounter notes, upcoming appointments, etc.  Non-urgent messages can be  sent to your provider as well.   To learn more about what you can do with MyChart, go to [email or web address].    Your next appointment:   12 month(s)  The format for your next appointment:   In Person  Provider:   Glenetta Hew, MD    Studies Ordered:   Orders Placed This Encounter  Procedures  . EKG 12-Lead  . ECHOCARDIOGRAM COMPLETE     Glenetta Hew, M.D., M.S. Interventional Cardiologist   Pager # [phone] Phone # [phone] [address] [place], [place] [address]   Thank you for  choosing [facility] at [facility]!!

## 2020-10-11 NOTE — Patient Instructions (Addendum)
Medication Instructions:  Discontinue plavix and begin taking eliquis 5mg  twice daily.  *If you need a refill on your cardiac medications before your next appointment, please call your pharmacy*  Testing/Procedures: Your physician has requested that you have an echocardiogram. Echocardiography is a painless test that uses sound waves to create images of your heart. It provides your doctor with information about the size and shape of your heart and how well your heart's chambers and valves are working. This procedure takes approximately one hour. There are no restrictions for this procedure. At [facility] Cardiology office.   Follow-Up: At [facility], you and your health needs are our priority.  As part of our continuing mission to provide you with exceptional heart care, we have created designated Provider Care Teams.  These Care Teams include your primary Cardiologist (physician) and Advanced Practice Providers (APPs -  Physician Assistants and Nurse Practitioners) who all work together to provide you with the care you need, when you need it.  We recommend signing up for the patient portal called "MyChart".  Sign up information is provided on this After Visit Summary.  MyChart is used to connect with patients for Virtual Visits (Telemedicine).  Patients are able to view lab/test results, encounter notes, upcoming appointments, etc.  Non-urgent messages can be sent to your provider as well.   To learn more about what you can do with MyChart, go to [email or web address].    Your next appointment:   12 month(s)  The format for your next appointment:   In Person  Provider:   Glenetta Hew, MD

## 2020-10-12 ENCOUNTER — Encounter: Payer: Self-pay | Admitting: Cardiology

## 2020-10-12 ENCOUNTER — Telehealth: Payer: Self-pay

## 2020-10-12 NOTE — Assessment & Plan Note (Signed)
Completely asymptomatic, rate controlled on current dose of Toprol. He is very active no anginal symptoms.  I do not see any reason for ischemic evaluation.  I will however check 2D echocardiogram since he has a diagnosis of mitral regurgitation.  Need to exclude worsening valvular AF.  Continue current dose of Toprol. This patients CHA2DS2-VASc Score and unadjusted Ischemic Stroke Rate (% per year) is equal to 7.2 % stroke rate/year from a score of 5 -> HTN, DM-2, vascular - 3, age 86 -2 -> Convert from Plavix 75 mg daily to Eliquis 5 mg twice daily

## 2020-10-12 NOTE — Telephone Encounter (Signed)
Copied from [place] [id number]. Topic: General - Other >> Oct 12, 2020  9:19 AM Oneta Rack wrote: Reason for CRM: patient requesting a COVID vaccine [place] appointment, please advise at [phone]

## 2020-10-12 NOTE — Assessment & Plan Note (Signed)
With new diagnosis of A. fib, need to exclude progression of disease.  Will check 2D echo now.  Plan was to hold off for 2 more years until next year, but now with new diagnosis of A. fib we will check an echo now.

## 2020-10-12 NOTE — Assessment & Plan Note (Addendum)
Borderline elevated blood pressure today, but stable for him.  We will hold off on adjusting any further medications for now.  Continue current dose of Toprol plus ACE inhibitor.  If pressures do increase, would recommend increasing to 10 mg of ramipril.

## 2020-10-12 NOTE — Assessment & Plan Note (Signed)
Most recent LDL was 78.  Close to goal given his age.  We will continue current dose of Crestor 10 mg.

## 2020-10-12 NOTE — Assessment & Plan Note (Signed)
The plan was to follow-up echo in 3 years based on last echo results, however now with new diagnosis of A. fib, will need to assess valvular disease for potential progression of disease of aortic stenosis versus mitral regurgitation.

## 2020-10-12 NOTE — Assessment & Plan Note (Addendum)
History of 2 stents in the RCA one of the LAD.  Has been 7 years out.  No further angina.  With new diagnosis of A. fib, we will convert from Plavix to DOAC.  Eliquis 5 mg twice daily On stable dose of beta-blocker, ACE inhibitor and statin. Lipids not quite at goal, but okay given his age.  Also on Invokana-SGLT2 inhibitor.

## 2020-10-17 ENCOUNTER — Other Ambulatory Visit: Payer: PPO

## 2020-10-18 ENCOUNTER — Other Ambulatory Visit: Payer: Self-pay | Admitting: Family Medicine

## 2020-10-18 DIAGNOSIS — E1121 Type 2 diabetes mellitus with diabetic nephropathy: Secondary | ICD-10-CM

## 2020-10-18 MED ORDER — ONETOUCH ULTRA VI STRP: ORAL_STRIP | 0 refills | Status: DC

## 2020-10-18 NOTE — Telephone Encounter (Signed)
Medication Refill - Medication: ONETOUCH ULTRA test strip    Has the patient contacted their pharmacy? Yes.   (Agent: If no, request that the patient contact the pharmacy for the refill.) (Agent: If yes, when and what did the pharmacy advise?)  Preferred Pharmacy (with phone number or street name):  [facility] [place], [place] [address]  [place] [address]  Phone: [phone] Fax: [phone]     Agent: Please be advised that RX refills may take up to 3 business days. We ask that you follow-up with your pharmacy.

## 2020-11-07 ENCOUNTER — Other Ambulatory Visit: Payer: Self-pay | Admitting: Family Medicine

## 2020-11-07 DIAGNOSIS — E1122 Type 2 diabetes mellitus with diabetic chronic kidney disease: Secondary | ICD-10-CM

## 2020-11-09 ENCOUNTER — Ambulatory Visit (INDEPENDENT_AMBULATORY_CARE_PROVIDER_SITE_OTHER): Payer: PPO

## 2020-11-09 ENCOUNTER — Other Ambulatory Visit: Payer: Self-pay

## 2020-11-09 DIAGNOSIS — I4891 Unspecified atrial fibrillation: Secondary | ICD-10-CM | POA: Diagnosis not present

## 2020-11-09 DIAGNOSIS — I350 Nonrheumatic aortic (valve) stenosis: Secondary | ICD-10-CM

## 2020-11-09 DIAGNOSIS — I340 Nonrheumatic mitral (valve) insufficiency: Secondary | ICD-10-CM

## 2020-11-09 HISTORY — PX: TRANSTHORACIC ECHOCARDIOGRAM: SHX275

## 2020-11-09 LAB — ECHOCARDIOGRAM COMPLETE
AR max vel: 1.6 cm2
AV Area VTI: 1.77 cm2
AV Area mean vel: 1.64 cm2
AV Mean grad: 5 mmHg
AV Peak grad: 9.2 mmHg
Ao pk vel: 1.52 m/s
Calc EF: 44.9 %
Single Plane A2C EF: 40.3 %
Single Plane A4C EF: 46.6 %

## 2020-11-10 ENCOUNTER — Other Ambulatory Visit: Payer: PPO

## 2020-11-14 ENCOUNTER — Ambulatory Visit: Payer: PPO | Admitting: Podiatry

## 2020-11-14 ENCOUNTER — Encounter: Payer: Self-pay | Admitting: Podiatry

## 2020-11-14 ENCOUNTER — Other Ambulatory Visit: Payer: Self-pay

## 2020-11-14 DIAGNOSIS — M79674 Pain in right toe(s): Secondary | ICD-10-CM | POA: Diagnosis not present

## 2020-11-14 DIAGNOSIS — N181 Chronic kidney disease, stage 1: Secondary | ICD-10-CM

## 2020-11-14 DIAGNOSIS — B351 Tinea unguium: Secondary | ICD-10-CM

## 2020-11-14 DIAGNOSIS — M79675 Pain in left toe(s): Secondary | ICD-10-CM

## 2020-11-14 DIAGNOSIS — D689 Coagulation defect, unspecified: Secondary | ICD-10-CM

## 2020-11-14 DIAGNOSIS — E1122 Type 2 diabetes mellitus with diabetic chronic kidney disease: Secondary | ICD-10-CM

## 2020-11-17 NOTE — Progress Notes (Addendum)
Subjective:   Charles Sellers. is a 86 y.o. male who presents for Medicare Annual/Subsequent preventive examination.  I connected with Charles Sellers today by telephone and verified that I am speaking with the correct person using two identifiers. Location patient: home Location provider: work Persons participating in the virtual visit: patient, provider.   I discussed the limitations, risks, security and privacy concerns of performing an evaluation and management service by telephone and the availability of in person appointments. I also discussed with the patient that there may be a patient responsible charge related to this service. The patient expressed understanding and verbally consented to this telephonic visit.    Interactive audio and video telecommunications were attempted between this provider and patient, however failed, due to patient having technical difficulties OR patient did not have access to video capability.  We continued and completed visit with audio only.   Review of Systems    N/A  Cardiac Risk Factors include: advanced age (>47men, >28 women);diabetes mellitus;dyslipidemia;male gender;hypertension     Objective:    There were no vitals filed for this visit. There is no height or weight on file to calculate BMI.  Advanced Directives 11/21/2020 11/17/2019 10/24/2018 10/18/2017 08/03/2015 07/26/2015  Does Patient Have a Medical Advance Directive? Yes Yes Yes Yes Yes Yes  Type of Paramedic of [place];Living will Living will;Healthcare Power of [place];Living will [place];Living will Living will;Healthcare Power of Attorney Living will  Does patient want to make changes to medical advance directive? - - - - No - Patient declined -  Copy of [place] in Chart? No - copy requested No - copy requested No - copy requested No - copy requested - -    Current Medications  (verified) Outpatient Encounter Medications as of 11/21/2020  Medication Sig   apixaban (ELIQUIS) 5 MG TABS tablet Take 1 tablet (5 mg total) by mouth 2 (two) times daily.   APPLE CIDER VINEGAR PO Take by mouth daily.    cholecalciferol (VITAMIN D) 1000 UNITS tablet Take 1,000 Units by mouth daily.   glucose blood (ONETOUCH ULTRA) test strip USE EVERY DAY AS DIRECTED   metFORMIN (GLUCOPHAGE) 500 MG tablet TAKE 1 TABLET BY MOUTH EVERY DAY WITH BREAKFAST   metoprolol succinate (TOPROL-XL) 25 MG 24 hr tablet Take 0.5 tablets (12.5 mg total) by mouth daily.   Multiple Vitamin (MULTIVITAMIN) tablet Take 1 tablet by mouth daily.   Multiple Vitamins-Minerals (ICAPS AREDS 2 PO) Take 1 capsule by mouth daily.   naproxen sodium (ALEVE) 220 MG tablet Take 220 mg by mouth.   nitroGLYCERIN (NITROSTAT) 0.4 MG SL tablet Place 1 tablet (0.4 mg total) under the tongue every 5 (five) minutes as needed for chest pain.   Omega-3 Fatty Acids (FISH OIL) 1000 MG CAPS Take 1,000 mg by mouth daily.   ONETOUCH DELICA LANCETS FINE MISC One finger stick daily for fasting blood sugar check.   pantoprazole (PROTONIX) 40 MG tablet TAKE 1 TABLET ON MON WED AND FRI OF EACHWEEK   ramipril (ALTACE) 5 MG capsule TAKE 1 CAPSULE BY MOUTH ONCE DAILY   rosuvastatin (CRESTOR) 10 MG tablet TAKE 1 TABLET BY MOUTH DAILY   naproxen sodium (ANAPROX) 220 MG tablet Take 220 mg by mouth daily as needed.  (Patient not taking: Reported on 11/21/2020)   No facility-administered encounter medications on file as of 11/21/2020.    Allergies (verified) Niaspan [niacin er] and Penicillins   History:  Past Medical History:  Diagnosis Date   Atherosclerosis    CAD (coronary artery disease) 2011 & 2009   Echo, EF =>55% 2011/ Echo, EF =>55% 2009   CAD S/P percutaneous coronary angioplasty 12/13 & 15 2005   PCI - mRCA (Cypher DES 3.5 mm x 18 mm --> 4.81mm); RPL Cypher 2.5 mm x 13 mm;; mLAD Cy a pher DES 3.0 mm x 13 mm (3.5 mm) with Cutting PTCA  of D2 ostium;; mid Cx 100% after small OM1.   Cholecystitis    Dermatitis    Diabetes mellitus type II, controlled ([place])    Diet controlled. No medications; he indicates "borderline diabetes"   Dyslipidemia, goal LDL below 70     on statin   Hypertension    Leaky heart valve    Prostate nodule    Ventral hernia    Past Surgical History:  Procedure Laterality Date   APPENDECTOMY     CARDIAC CATHETERIZATION  2005   Carotid Dopplers  September 2014   Bilateral internal carotids < 49%.   CORONARY ANGIOPLASTY WITH STENT PLACEMENT     mRCA Cypher DES 3.5 mm x 18 mm (4.0), RPL Cypher DES 2.5 mm x 13 mm; mLAD 3.0 mm x 13 mm Cypher -> cutting PTCA of jailed D2; previousl PCI of Cx - 100% mid Cx.   NM MYOVIEW LTD  06/2012   Small area of basal inferior infarct with no ischemia. Normal EF   Stress Myoview  2013   normal findings   TRANSTHORACIC ECHOCARDIOGRAM  February 2014    EF 55%. Mild-moderate MR   Family History  Problem Relation Age of Onset   Stroke Mother    Liver cancer Father    Diabetes Father    Esophageal cancer Brother    Cerebral aneurysm Daughter    Melanoma Daughter    Cerebral aneurysm Son    Social History   Socioeconomic History   Marital status: Married    Spouse name: Not on file   Number of children: 4   Years of education: Not on file   Highest education level: High school graduate  Occupational History   Occupation: retired    Comment: previously worked at [employer] Use   Smoking status: Never Smoker   Smokeless tobacco: Never Used  Scientific laboratory technician Use: Never used  Substance and Sexual Activity   Alcohol use: No    Alcohol/week: 0.0 standard drinks   Drug use: No   Sexual activity: Not on file  Other Topics Concern   Not on file  Social History Narrative   Married father of 3, 9 grandchildren, now 53 great-grandchildren. 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   Using the toilet, but does not do routine exercise. Always on the go.   Never smoked. Does not drink alcohol.   Social Determinants of Health   Financial Resource Strain: Low Risk    Difficulty of Paying Living Expenses: Not hard at all  Food Insecurity: No Food Insecurity   Worried About Charity fundraiser in the Last Year: Never true   [place] in the Last Year: Never true  Transportation Needs: No Transportation Needs   Lack of Transportation (Medical): No   Lack of Transportation (Non-Medical): No  Physical Activity: Inactive   Days of Exercise per Week: 0  days   Minutes of Exercise per Session: 0 min  Stress: No Stress Concern Present   Feeling of Stress : Not at all  Social Connections: Moderately Integrated   Frequency of Communication with Friends and Family: More than three times a week   Frequency of Social Gatherings with Friends and Family: More than three times a week   Attends Religious Services: More than 4 times per year   Active Member of [employer] or Organizations: No   Attends Music therapist: Never   Marital Status: Married    Tobacco Counseling Counseling given: Not Answered   Clinical Intake:  Pre-visit preparation completed: Yes  Pain : No/denies pain     Nutritional Risks: None Diabetes: Yes  How often do you need to have someone help you when you read instructions, pamphlets, or other written materials from your doctor or pharmacy?: 1 - Never  Diabetic? Yes  Nutrition Risk Assessment:  Has the patient had any N/V/D within the last 2 months?  No  Does the patient have any non-healing wounds?  No  Has the patient had any unintentional weight loss or weight gain?  No   Diabetes:  Is the patient diabetic?  Yes  If diabetic, was a CBG obtained today?  No  Did the patient bring in their glucometer from home?  No  How often do you monitor your CBG's? Once  a day in the morning.   Financial Strains and Diabetes Management:  Are you having any financial strains with the device, your supplies or your medication? No .  Does the patient want to be seen by Chronic Care Management for management of their diabetes?  No  Would the patient like to be referred to a Nutritionist or for Diabetic Management?  No   Diabetic Exams:  Diabetic Eye Exam: Overdue for diabetic eye exam. Pt has been advised about the importance in completing this exam. Patient advised to call and schedule an eye exam. Diabetic Foot Exam: Completed 09/01/20   Interpreter Needed?: No  Information entered by :: [facility], LPN   Activities of Daily Living In your present state of health, do you have any difficulty performing the following activities: 11/21/2020 09/02/2020  Hearing? N N  Vision? N N  Difficulty concentrating or making decisions? N N  Walking or climbing stairs? N N  Dressing or bathing? N N  Doing errands, shopping? N N  Preparing Food and eating ? N -  Using the Toilet? N -  In the past six months, have you accidently leaked urine? Y -  Comment At night due to frequency. Wears depends. -  Do you have problems with loss of bowel control? N -  Managing your Medications? N -  Managing your Finances? N -  Housekeeping or managing your Housekeeping? N -  Comment Has assistance with cleaning home. -  Some recent data might be hidden    Patient Care Team: Chrismon, Driscilla Grammes as PCP - General (Physician Assistant) Leonie Man, MD as PCP - Cardiology (Cardiology) Birder Robson, MD as Referring Physician (Ophthalmology) Birder Robson, MD as Referring Physician (Ophthalmology) Leonie Man, MD as Consulting Physician (Cardiology) Gardiner Barefoot, DPM as Consulting Physician (Podiatry)  Indicate any recent Medical Services you may have received from other than [facility] providers in the past year (date may be approximate).     Assessment:    This is a routine wellness examination for Charles Sellers.  Hearing/Vision screen No exam data present  Dietary  issues and exercise activities discussed: Current Exercise Habits: The patient has a physically strenuous job, but has no regular exercise apart from work., Exercise limited by: None identified  Goals      DIET - INCREASE LEAN PROTEINS     Recommend to cut out the fried fatty meats and start eating more baked or broiled chicken instead.      Increase water intake     Recommend increasing water intake to 6-8 glasses a day.      Prevent falls     Recommend to remove any items from the home that may cause slips or trips.       Depression Screen PHQ 2/9 Scores 11/21/2020 09/02/2020 11/17/2019 10/24/2018 10/18/2017 06/18/2017 01/09/2016  PHQ - 2 Score 0 0 0 3 0 0 0  PHQ- 9 Score - 0 - 3 - 0 -    Fall Risk Fall Risk  11/21/2020 09/02/2020 11/17/2019 10/24/2018 10/18/2017  Falls in the past year? 1 0 0 0 No  Number falls in past yr: 0 0 0 - -  Injury with Fall? 0 0 0 - -  Follow up Falls prevention discussed - - - -    FALL RISK PREVENTION PERTAINING TO THE HOME:  Any stairs in or around the home? Yes  If so, are there any without handrails? No  Home free of loose throw rugs in walkways, pet beds, electrical cords, etc? Yes  Adequate lighting in your home to reduce risk of falls? Yes   ASSISTIVE DEVICES UTILIZED TO PREVENT FALLS:  Life alert? No  Use of a cane, walker or w/c? No  Grab bars in the bathroom? Yes  Shower chair or bench in shower? Yes  Elevated toilet seat or a handicapped toilet? Yes    Cognitive Function: Declined today.         Immunizations Immunization History  Administered Date(s) Administered   Fluad Quad(high Dose 65+) 10/20/2019, 09/01/2020   Influenza, High Dose Seasonal PF 10/18/2017, 10/24/2018   Pneumococcal Conjugate-13 01/09/2016   Pneumococcal Polysaccharide-23 08/04/2008   Tdap 01/09/2016   Zoster Recombinat (Shingrix) 11/04/2019,  01/14/2020    TDAP status: Up to date  Flu Vaccine status: Up to date  Pneumococcal vaccine status: Up to date  Covid-19 vaccine status: Completed vaccines  Qualifies for Shingles Vaccine? Yes   Zostavax completed No   Shingrix Completed?: Yes  Screening Tests Health Maintenance  Topic Date Due   COVID-19 Vaccine (1) Never done   OPHTHALMOLOGY EXAM  11/19/2020   HEMOGLOBIN A1C  03/01/2021   FOOT EXAM  09/01/2021   TETANUS/TDAP  01/08/2026   INFLUENZA VACCINE  Completed   PNA vac Low Risk Adult  Completed    Health Maintenance  Health Maintenance Due  Topic Date Due   COVID-19 Vaccine (1) Never done   OPHTHALMOLOGY EXAM  11/19/2020    Colorectal cancer screening: No longer required.   Lung Cancer Screening: (Low Dose CT Chest recommended if Age 96-80 years, 30 pack-year currently smoking OR have quit w/in 15years.) does not qualify.   Additional Screening:  Vision Screening: Recommended annual ophthalmology exams for early detection of glaucoma and other disorders of the eye. Is the patient up to date with their annual eye exam?  Yes  Who is the provider or what is the name of the office in which the patient attends annual eye exams? Dr George Ina @ [place] If pt is not established with a provider, would they like to be referred to a provider to  establish care? No .   Dental Screening: Recommended annual dental exams for proper oral hygiene  Community Resource Referral / Chronic Care Management: CRR required this visit?  No   CCM required this visit?  No      Plan:     I have personally reviewed and noted the following in the patient's chart:   Medical and social history Use of alcohol, tobacco or illicit drugs  Current medications and supplements Functional ability and status Nutritional status Physical activity Advanced directives List of other physicians Hospitalizations, surgeries, and ER visits in previous 12 months Vitals Screenings to include  cognitive, depression, and falls Referrals and appointments  In addition, I have reviewed and discussed with patient certain preventive protocols, quality metrics, and best practice recommendations. A written personalized care plan for preventive services as well as general preventive health recommendations were provided to patient.     Charles Sellers [place], [place]   [id number]   Nurse Notes: Pt has an eye exam scheduled for 11/23/20 with Dr George Ina. Requested Covid vaccine card information to up date chart.   Reviewed note and plan of Nurse Health Advisor's screening. Agree with documentation and recommendations.

## 2020-11-21 ENCOUNTER — Ambulatory Visit (INDEPENDENT_AMBULATORY_CARE_PROVIDER_SITE_OTHER): Payer: PPO

## 2020-11-21 ENCOUNTER — Other Ambulatory Visit: Payer: Self-pay

## 2020-11-21 DIAGNOSIS — Z0000 Encounter for general adult medical examination without abnormal findings: Secondary | ICD-10-CM

## 2020-11-21 NOTE — Patient Instructions (Signed)
Charles Sellers , Thank you for taking time to come for your Medicare Wellness Visit. I appreciate your ongoing commitment to your health goals. Please review the following plan we discussed and let me know if I can assist you in the future.   Screening recommendations/referrals: Colonoscopy: No longer required.  Recommended yearly ophthalmology/optometry visit for glaucoma screening and checkup Recommended yearly dental visit for hygiene and checkup  Vaccinations: Influenza vaccine: Done 09/01/20 Pneumococcal vaccine: Completed series Tdap vaccine: Up to date, due 12/2025 Shingles vaccine: Completed series    Advanced directives: Currently on file.  Conditions/risks identified: Fall risk preventatives discussed today. Recommend to continue to but out fried, fatty foods and focus on eating baked or broiled chicken  Next appointment: 12/05/20 @ 10:20 AM with [place] 65 Years and Older, Male Preventive care refers to lifestyle choices and visits with your health care provider that can promote health and wellness. What does preventive care include?  A yearly physical exam. This is also called an annual well check.  Dental exams once or twice a year.  Routine eye exams. Ask your health care provider how often you should have your eyes checked.  Personal lifestyle choices, including:  Daily care of your teeth and gums.  Regular physical activity.  Eating a healthy diet.  Avoiding tobacco and drug use.  Limiting alcohol use.  Practicing safe sex.  Taking low doses of aspirin every day.  Taking vitamin and mineral supplements as recommended by your health care provider. What happens during an annual well check? The services and screenings done by your health care provider during your annual well check will depend on your age, overall health, lifestyle risk factors, and family history of disease. Counseling  Your health care provider may ask you questions  about your:  Alcohol use.  Tobacco use.  Drug use.  Emotional well-being.  Home and relationship well-being.  Sexual activity.  Eating habits.  History of falls.  Memory and ability to understand (cognition).  Work and work Statistician. Screening  You may have the following tests or measurements:  Height, weight, and BMI.  Blood pressure.  Lipid and cholesterol levels. These may be checked every 5 years, or more frequently if you are over 84 years old.  Skin check.  Lung cancer screening. You may have this screening every year starting at age 84 if you have a 30-pack-year history of smoking and currently smoke or have quit within the past 15 years.  Fecal occult blood test (FOBT) of the stool. You may have this test every year starting at age 84.  Flexible sigmoidoscopy or colonoscopy. You may have a sigmoidoscopy every 5 years or a colonoscopy every 10 years starting at age 84.  Prostate cancer screening. Recommendations will vary depending on your family history and other risks.  Hepatitis C blood test.  Hepatitis B blood test.  Sexually transmitted disease (STD) testing.  Diabetes screening. This is done by checking your blood sugar (glucose) after you have not eaten for a while (fasting). You may have this done every 1-3 years.  Abdominal aortic aneurysm (AAA) screening. You may need this if you are a current or former smoker.  Osteoporosis. You may be screened starting at age 84 if you are at high risk. Talk with your health care provider about your test results, treatment options, and if necessary, the need for more tests. Vaccines  Your health care provider may recommend certain vaccines, such as:  Influenza vaccine. This is  recommended every year.  Tetanus, diphtheria, and acellular pertussis (Tdap, Td) vaccine. You may need a Td booster every 10 years.  Zoster vaccine. You may need this after age 84.  Pneumococcal 13-valent conjugate (PCV13)  vaccine. One dose is recommended after age 84.  Pneumococcal polysaccharide (PPSV23) vaccine. One dose is recommended after age 84. Talk to your health care provider about which screenings and vaccines you need and how often you need them. This information is not intended to replace advice given to you by your health care provider. Make sure you discuss any questions you have with your health care provider. Document Released: 12/23/2015 Document Revised: 08/15/2016 Document Reviewed: 09/27/2015 Elsevier Interactive Patient Education  2017 [place] Prevention in the Home Falls can cause injuries. They can happen to people of all ages. There are many things you can do to make your home safe and to help prevent falls. What can I do on the outside of my home?  Regularly fix the edges of walkways and driveways and fix any cracks.  Remove anything that might make you trip as you walk through a door, such as a raised step or threshold.  Trim any bushes or trees on the path to your home.  Use bright outdoor lighting.  Clear any walking paths of anything that might make someone trip, such as rocks or tools.  Regularly check to see if handrails are loose or broken. Make sure that both sides of any steps have handrails.  Any raised decks and porches should have guardrails on the edges.  Have any leaves, snow, or ice cleared regularly.  Use sand or salt on walking paths during winter.  Clean up any spills in your garage right away. This includes oil or grease spills. What can I do in the bathroom?  Use night lights.  Install grab bars by the toilet and in the tub and shower. Do not use towel bars as grab bars.  Use non-skid mats or decals in the tub or shower.  If you need to sit down in the shower, use a plastic, non-slip stool.  Keep the floor dry. Clean up any water that spills on the floor as soon as it happens.  Remove soap buildup in the tub or shower  regularly.  Attach bath mats securely with double-sided non-slip rug tape.  Do not have throw rugs and other things on the floor that can make you trip. What can I do in the bedroom?  Use night lights.  Make sure that you have a light by your bed that is easy to reach.  Do not use any sheets or blankets that are too big for your bed. They should not hang down onto the floor.  Have a firm chair that has side arms. You can use this for support while you get dressed.  Do not have throw rugs and other things on the floor that can make you trip. What can I do in the kitchen?  Clean up any spills right away.  Avoid walking on wet floors.  Keep items that you use a lot in easy-to-reach places.  If you need to reach something above you, use a strong step stool that has a grab bar.  Keep electrical cords out of the way.  Do not use floor polish or wax that makes floors slippery. If you must use wax, use non-skid floor wax.  Do not have throw rugs and other things on the floor that can make you trip.  What can I do with my stairs?  Do not leave any items on the stairs.  Make sure that there are handrails on both sides of the stairs and use them. Fix handrails that are broken or loose. Make sure that handrails are as long as the stairways.  Check any carpeting to make sure that it is firmly attached to the stairs. Fix any carpet that is loose or worn.  Avoid having throw rugs at the top or bottom of the stairs. If you do have throw rugs, attach them to the floor with carpet tape.  Make sure that you have a light switch at the top of the stairs and the bottom of the stairs. If you do not have them, ask someone to add them for you. What else can I do to help prevent falls?  Wear shoes that:  Do not have high heels.  Have rubber bottoms.  Are comfortable and fit you well.  Are closed at the toe. Do not wear sandals.  If you use a stepladder:  Make sure that it is fully  opened. Do not climb a closed stepladder.  Make sure that both sides of the stepladder are locked into place.  Ask someone to hold it for you, if possible.  Clearly mark and make sure that you can see:  Any grab bars or handrails.  First and last steps.  Where the edge of each step is.  Use tools that help you move around (mobility aids) if they are needed. These include:  Canes.  Walkers.  Scooters.  Crutches.  Turn on the lights when you go into a dark area. Replace any light bulbs as soon as they burn out.  Set up your furniture so you have a clear path. Avoid moving your furniture around.  If any of your floors are uneven, fix them.  If there are any pets around you, be aware of where they are.  Review your medicines with your doctor. Some medicines can make you feel dizzy. This can increase your chance of falling. Ask your doctor what other things that you can do to help prevent falls. This information is not intended to replace advice given to you by your health care provider. Make sure you discuss any questions you have with your health care provider. Document Released: 09/22/2009 Document Revised: 05/03/2016 Document Reviewed: 12/31/2014 Elsevier Interactive Patient Education  2017 [employer].

## 2020-11-22 ENCOUNTER — Telehealth: Payer: Self-pay

## 2020-11-22 DIAGNOSIS — Z1152 Encounter for screening for COVID-19: Secondary | ICD-10-CM | POA: Diagnosis not present

## 2020-11-22 DIAGNOSIS — Z03818 Encounter for observation for suspected exposure to other biological agents ruled out: Secondary | ICD-10-CM | POA: Diagnosis not present

## 2020-11-22 NOTE — Telephone Encounter (Signed)
Appointment made for patient.

## 2020-11-22 NOTE — Telephone Encounter (Signed)
Copied from [place] [id number]. Topic: General - Other >> Nov 22, 2020  9:49 AM Keene Breath wrote: Reason for CRM: Patient would like the nurse to call him regarding a cpak.  He stated that he has a cold and would like some medication.  Please advise and call patient to discuss at [phone]

## 2020-11-23 DIAGNOSIS — E119 Type 2 diabetes mellitus without complications: Secondary | ICD-10-CM | POA: Diagnosis not present

## 2020-11-23 LAB — HM DIABETES EYE EXAM

## 2020-11-24 ENCOUNTER — Telehealth: Payer: Self-pay | Admitting: Family Medicine

## 2020-12-05 ENCOUNTER — Ambulatory Visit (INDEPENDENT_AMBULATORY_CARE_PROVIDER_SITE_OTHER): Payer: PPO | Admitting: Family Medicine

## 2020-12-05 ENCOUNTER — Other Ambulatory Visit: Payer: Self-pay

## 2020-12-05 ENCOUNTER — Encounter: Payer: Self-pay | Admitting: Family Medicine

## 2020-12-05 VITALS — BP 151/86 | HR 98 | Temp 98.7°F | Ht 67.0 in | Wt 181.0 lb

## 2020-12-05 DIAGNOSIS — E1169 Type 2 diabetes mellitus with other specified complication: Secondary | ICD-10-CM | POA: Diagnosis not present

## 2020-12-05 DIAGNOSIS — E1122 Type 2 diabetes mellitus with diabetic chronic kidney disease: Secondary | ICD-10-CM | POA: Diagnosis not present

## 2020-12-05 DIAGNOSIS — N181 Chronic kidney disease, stage 1: Secondary | ICD-10-CM | POA: Diagnosis not present

## 2020-12-05 DIAGNOSIS — E785 Hyperlipidemia, unspecified: Secondary | ICD-10-CM

## 2020-12-05 DIAGNOSIS — R351 Nocturia: Secondary | ICD-10-CM | POA: Diagnosis not present

## 2020-12-05 NOTE — Progress Notes (Signed)
Established patient visit   Patient: Charles Sellers.   DOB: Aug 01, 1932   86 y.o. Male  MRN: [id number] Visit Date: 12/05/2020  Today's healthcare provider: [name], PA-C   No chief complaint on file.  Subjective    HPI  Diabetes Mellitus Type II, Follow-up  Lab Results  Component Value Date   HGBA1C 7.5 (H) 09/01/2020   HGBA1C 7.0 (A) 05/30/2020   HGBA1C 6.9 (H) 01/26/2020   Wt Readings from Last 3 Encounters:  10/11/20 178 lb 9.6 oz (81 kg)  09/01/20 176 lb (79.8 kg)  05/30/20 176 lb (79.8 kg)   Last seen for diabetes 3 months ago.  Management since then includes maintenance of diet. He reports good compliance with treatment. He is not having side effects.  Symptoms: No fatigue No foot ulcerations  No appetite changes No nausea  No paresthesia of the feet  No polydipsia  Yes polyuria No visual disturbances   No vomiting     Home blood sugar records: fasting range: 165 (ths morning)  Episodes of hypoglycemia? No    Current insulin regiment:  Most Recent Eye Exam: 11/23/20 Current exercise: chopping wood Current diet habits: in general, a "healthy" diet      Pertinent Labs: Lab Results  Component Value Date   CHOL 129 09/01/2020   HDL 38 (L) 09/01/2020   LDLCALC 78 09/01/2020   TRIG 60 09/01/2020   CHOLHDL 3.4 09/01/2020   Lab Results  Component Value Date   NA 141 09/01/2020   K 4.3 09/01/2020   CREATININE 0.69 (L) 09/01/2020   GFRNONAA 86 09/01/2020   GFRAA 99 09/01/2020   GLUCOSE 135 (H) 09/01/2020     ---------------------------------------------------------------------------------------------------   Patient Active Problem List   Diagnosis Date Noted  . New onset atrial fibrillation ([place]) 10/11/2020  . Coagulation defect ([place]) 09/01/2020  . Pain due to onychomycosis of toenails of both feet 07/06/2019  . Mild aortic stenosis 09/04/2017  . 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 Mitral regurgitation 07/30/2017  .  History of prolonged Q-T interval on ECG 06/22/2015  . Prostate lump 06/22/2015  . 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 Diabetes mellitus, type 2 ([place]) 06/22/2015  . CAD S/P percutaneous coronary angioplasty   . Diabetes mellitus type II, controlled ([place])   . Hyperlipidemia associated with type 2 diabetes mellitus ([place])   . Essential hypertension   . Arthropathia 02/17/2010  . Chondrocalcinosis of multiple sites 08/11/2008  . Acid reflux 08/11/2008  . Cannot sleep 07/23/2008  . UNSPECIFIED INFLAMMATORY POLYARTHROPATHY 07/20/2008  . MYOSITIS 07/20/2008  . FEVER UNSPECIFIED 07/20/2008  . EXANTHEM 07/20/2008  . Muscle ache 07/16/2008  . Disorder of sacrum 06/19/2008  . Thyrotoxicosis 06/19/2008   Past Medical History:  Diagnosis Date  . Atherosclerosis   . CAD (coronary artery disease) 2011 & 2009   Echo, EF =>55% 2011/ Echo, EF =>55% 2009  . CAD S/P percutaneous coronary angioplasty 12/13 & 15 2005   PCI - mRCA (Cypher DES 3.5 mm x 18 mm --> 4.72mm); RPL Cypher 2.5 mm x 13 mm;; mLAD Cy a pher DES 3.0 mm x 13 mm (3.5 mm) with Cutting PTCA of D2 ostium;; mid Cx 100% after small OM1.  . Cholecystitis   . Dermatitis   . Diabetes mellitus type II, controlled ([place])    Diet controlled. No medications; he indicates "borderline diabetes"  . Dyslipidemia, goal LDL below 70     on statin  . Hypertension   .  Leaky heart valve   . Prostate nodule   . Ventral hernia    Family History  Problem Relation Age of Onset  . Stroke Mother   . Liver cancer Father   . Diabetes Father   . Esophageal cancer Brother   . Cerebral aneurysm Daughter   . Melanoma Daughter   . Cerebral aneurysm Son    Allergies  Allergen Reactions  . Niaspan [Niacin Er] Rash  . Penicillins Rash     Social History   Tobacco Use  . Smoking status: Never Smoker  . Smokeless tobacco: Never Used  Vaping Use  . Vaping Use: Never used  Substance Use Topics  . Alcohol use: No    Alcohol/week: 0.0 standard drinks  .  Drug use: No      Medications: Outpatient Medications Prior to Visit  Medication Sig  . apixaban (ELIQUIS) 5 MG TABS tablet Take 1 tablet (5 mg total) by mouth 2 (two) times daily.  . APPLE CIDER VINEGAR PO Take by mouth daily.   . cholecalciferol (VITAMIN D) 1000 UNITS tablet Take 1,000 Units by mouth daily.  Marland Kitchen glucose blood (ONETOUCH ULTRA) test strip USE EVERY DAY AS DIRECTED  . metFORMIN (GLUCOPHAGE) 500 MG tablet TAKE 1 TABLET BY MOUTH EVERY DAY WITH BREAKFAST  . metoprolol succinate (TOPROL-XL) 25 MG 24 hr tablet Take 0.5 tablets (12.5 mg total) by mouth daily.  . Multiple Vitamin (MULTIVITAMIN) tablet Take 1 tablet by mouth daily.  . Multiple Vitamins-Minerals (ICAPS AREDS 2 PO) Take 1 capsule by mouth daily.  . naproxen sodium (ALEVE) 220 MG tablet Take 220 mg by mouth.  . naproxen sodium (ANAPROX) 220 MG tablet Take 220 mg by mouth daily as needed.  (Patient not taking: Reported on 11/21/2020)  . nitroGLYCERIN (NITROSTAT) 0.4 MG SL tablet Place 1 tablet (0.4 mg total) under the tongue every 5 (five) minutes as needed for chest pain.  . Omega-3 Fatty Acids (FISH OIL) 1000 MG CAPS Take 1,000 mg by mouth daily.  Letta Pate DELICA LANCETS FINE MISC One finger stick daily for fasting blood sugar check.  . pantoprazole (PROTONIX) 40 MG tablet TAKE 1 TABLET ON MON WED AND FRI OF EACHWEEK  . ramipril (ALTACE) 5 MG capsule TAKE 1 CAPSULE BY MOUTH ONCE DAILY  . rosuvastatin (CRESTOR) 10 MG tablet TAKE 1 TABLET BY MOUTH DAILY   No facility-administered medications prior to visit.    Review of Systems  Constitutional: Negative.   HENT: Negative.   Respiratory: Negative.   Cardiovascular: Negative.   Gastrointestinal: Negative.   Genitourinary:       Nocturia every hour or two.      Objective    BP (!) 151/86 (BP Location: Left Arm, Patient Position: Sitting, Cuff Size: Large)   Pulse 98   Temp 98.7 F (37.1 C) (Oral)   Ht 5\' 7"  (1.702 m)   Wt 181 lb (82.1 kg)   SpO2 98%    BMI 28.35 kg/m  BP Readings from Last 3 Encounters:  12/05/20 (!) 151/86  10/11/20 140/88  09/01/20 140/85   Wt Readings from Last 3 Encounters:  12/05/20 181 lb (82.1 kg)  10/11/20 178 lb 9.6 oz (81 kg)  09/01/20 176 lb (79.8 kg)     Physical Exam Constitutional:      General: He is not in acute distress.    Appearance: He is well-developed and well-nourished.  HENT:     Head: Normocephalic and atraumatic.     Right Ear: Hearing normal.  Left Ear: Hearing normal.     Nose: Nose normal.  Eyes:     General: Lids are normal. No scleral icterus.       Right eye: No discharge.        Left eye: No discharge.     Conjunctiva/sclera: Conjunctivae normal.  Cardiovascular:     Rate and Rhythm: Normal rate. Rhythm irregular.     Heart sounds: Murmur heard.    Pulmonary:     Effort: Pulmonary effort is normal. No respiratory distress.  Musculoskeletal:        General: Normal range of motion.  Skin:    General: Skin is intact.     Findings: No lesion or rash.  Neurological:     Mental Status: He is alert and oriented to person, place, and time.  Psychiatric:        Mood and Affect: Mood and affect normal.        Speech: Speech normal.        Behavior: Behavior normal.        Thought Content: Thought content normal.       No results found for any visits on 12/05/20.  Assessment & Plan     1. Hyperlipidemia associated with type 2 diabetes mellitus (HCC) Tolerating Crestor 10 mg qd without side effects. Recheck labs and continue low fat diabetic diet. On 09-01-20, LDL was 78 and HDL was 38 on this regimen. Cardiologist (Dr. Ellyn Hack) recommended he continue this and recheck labs. - CBC with Differential/Platelet - Comprehensive metabolic panel - Lipid panel  2. Controlled type 2 diabetes mellitus with stage 1 chronic kidney disease, without long-term current use of insulin (HCC) FBS in the 130-202 range the past month. Hgb A1C was 7.5 on 09-01-20. Denies polydipsia or  visual disturbance. Has had podiatry and ophthalmology follow up the past month. Still taking Metformin 500 mg qd. Cardiologist suggested Invokana if additional diabetes treatment needed. Patient wants to try Metformin 500 mg BID first. Recheck follow up labs. - CBC with Differential/Platelet - Comprehensive metabolic panel - Lipid panel - Hemoglobin A1c  3. Nocturia States he gets up to urinate every 1-2 hours at night. No significant frequency during the day. FBS has been elevated the past month and wants to try an extra dose of Metformin. Will check labs and follow up pending reports. - CBC with Differential/Platelet - Comprehensive metabolic panel - PSA   No follow-ups on file.      I, Cali Hope, PA-C, have reviewed all documentation for this visit. The documentation on 12/05/20 for the exam, diagnosis, procedures, and orders are all accurate and complete.    [name], PA-C  [employer] [phone] (phone) [phone] (fax)  [place]

## 2020-12-06 LAB — CBC WITH DIFFERENTIAL/PLATELET
Basophils Absolute: 0 10*3/uL (ref 0.0–0.2)
Basos: 1 %
EOS (ABSOLUTE): 0.2 10*3/uL (ref 0.0–0.4)
Eos: 3 %
Hematocrit: 45.5 % (ref 37.5–51.0)
Hemoglobin: 15.5 g/dL (ref 13.0–17.7)
Immature Grans (Abs): 0 10*3/uL (ref 0.0–0.1)
Immature Granulocytes: 0 %
Lymphocytes Absolute: 1.4 10*3/uL (ref 0.7–3.1)
Lymphs: 18 %
MCH: 29.8 pg (ref 26.6–33.0)
MCHC: 34.1 g/dL (ref 31.5–35.7)
MCV: 88 fL (ref 79–97)
Monocytes Absolute: 0.9 10*3/uL (ref 0.1–0.9)
Monocytes: 11 %
Neutrophils Absolute: 5.2 10*3/uL (ref 1.4–7.0)
Neutrophils: 67 %
Platelets: 169 10*3/uL (ref 150–450)
RBC: 5.2 x10E6/uL (ref 4.14–5.80)
RDW: 12.6 % (ref 11.6–15.4)
WBC: 7.7 10*3/uL (ref 3.4–10.8)

## 2020-12-06 LAB — COMPREHENSIVE METABOLIC PANEL
ALT: 19 IU/L (ref 0–44)
AST: 19 IU/L (ref 0–40)
Albumin/Globulin Ratio: 1.6 (ref 1.2–2.2)
Albumin: 4.2 g/dL (ref 3.6–4.6)
Alkaline Phosphatase: 76 IU/L (ref 44–121)
BUN/Creatinine Ratio: 18 (ref 10–24)
BUN: 13 mg/dL (ref 8–27)
Bilirubin Total: 0.9 mg/dL (ref 0.0–1.2)
CO2: 23 mmol/L (ref 20–29)
Calcium: 8.9 mg/dL (ref 8.6–10.2)
Chloride: 104 mmol/L (ref 96–106)
Creatinine, Ser: 0.73 mg/dL — ABNORMAL LOW (ref 0.76–1.27)
GFR calc Af Amer: 97 mL/min/{1.73_m2} (ref >59)
GFR calc non Af Amer: 84 mL/min/{1.73_m2} (ref >59)
Globulin, Total: 2.7 g/dL (ref 1.5–4.5)
Glucose: 149 mg/dL — ABNORMAL HIGH (ref 65–99)
Potassium: 4.2 mmol/L (ref 3.5–5.2)
Sodium: 140 mmol/L (ref 134–144)
Total Protein: 6.9 g/dL (ref 6.0–8.5)

## 2020-12-06 LAB — LIPID PANEL
Chol/HDL Ratio: 3.4 ratio (ref 0.0–5.0)
Cholesterol, Total: 124 mg/dL (ref 100–199)
HDL: 36 mg/dL — ABNORMAL LOW (ref >39)
LDL Chol Calc (NIH): 76 mg/dL (ref 0–99)
Triglycerides: 52 mg/dL (ref 0–149)
VLDL Cholesterol Cal: 12 mg/dL (ref 5–40)

## 2020-12-06 LAB — HEMOGLOBIN A1C
Est. average glucose Bld gHb Est-mCnc: 183 mg/dL
Hgb A1c MFr Bld: 8 % — ABNORMAL HIGH (ref 4.8–5.6)

## 2020-12-06 LAB — PSA: Prostate Specific Ag, Serum: 2.8 ng/mL (ref 0.0–4.0)

## 2020-12-07 ENCOUNTER — Other Ambulatory Visit: Payer: Self-pay

## 2020-12-07 MED ORDER — PANTOPRAZOLE SODIUM 40 MG PO TBEC: DELAYED_RELEASE_TABLET | ORAL | 7 refills | Status: DC

## 2020-12-08 ENCOUNTER — Telehealth: Payer: Self-pay | Admitting: Family Medicine

## 2020-12-08 NOTE — Telephone Encounter (Signed)
Medication Refill - Medication:  One touch lancet/needles  Has the patient contacted their pharmacy? Yes . Pt said the pharm stated he will not call md office and pt told pharm He would call Preferred [facility]  [facility] [address] in [place] Agent: Please be advised that RX refills may take up to 3 business days. We ask that you follow-up with your pharmacy.

## 2020-12-08 NOTE — Telephone Encounter (Signed)
Patient requested refill for ONE TOUCH Fine Lancets. Previous order has expired-written on 01/27/16. Routing to clinic for new prescription to go to [facility], [id number] LOV 12/05/20.

## 2020-12-14 ENCOUNTER — Other Ambulatory Visit: Payer: Self-pay | Admitting: Family Medicine

## 2020-12-14 DIAGNOSIS — E1121 Type 2 diabetes mellitus with diabetic nephropathy: Secondary | ICD-10-CM

## 2020-12-14 MED ORDER — ONETOUCH FINEPOINT LANCETS MISC: 3 refills | Status: DC

## 2021-01-04 ENCOUNTER — Other Ambulatory Visit: Payer: Self-pay | Admitting: Family Medicine

## 2021-01-04 DIAGNOSIS — N181 Chronic kidney disease, stage 1: Secondary | ICD-10-CM

## 2021-01-04 DIAGNOSIS — E1122 Type 2 diabetes mellitus with diabetic chronic kidney disease: Secondary | ICD-10-CM

## 2021-01-06 ENCOUNTER — Telehealth: Payer: Self-pay

## 2021-01-09 ENCOUNTER — Other Ambulatory Visit: Payer: Self-pay

## 2021-01-09 DIAGNOSIS — E1121 Type 2 diabetes mellitus with diabetic nephropathy: Secondary | ICD-10-CM

## 2021-01-09 MED ORDER — ONETOUCH ULTRA VI STRP: ORAL_STRIP | 12 refills | Status: DC

## 2021-01-10 NOTE — Telephone Encounter (Signed)
Copied from [place] [id number]. Topic: Quick Communication - Rx Refill/Question >> Jan 06, 2021  3:50 PM Hinda Lenis D wrote: Pt ask if he can get so samples for // metFORMIN (GLUCOPHAGE) 500 MG tablet [[id number]] // please advise

## 2021-01-20 ENCOUNTER — Other Ambulatory Visit: Payer: Self-pay | Admitting: Family Medicine

## 2021-01-20 DIAGNOSIS — E1121 Type 2 diabetes mellitus with diabetic nephropathy: Secondary | ICD-10-CM

## 2021-01-23 ENCOUNTER — Telehealth: Payer: PPO | Admitting: Family Medicine

## 2021-02-13 ENCOUNTER — Encounter: Payer: Self-pay | Admitting: Podiatry

## 2021-02-13 ENCOUNTER — Other Ambulatory Visit: Payer: Self-pay

## 2021-02-13 ENCOUNTER — Ambulatory Visit: Payer: PPO | Admitting: Podiatry

## 2021-02-13 DIAGNOSIS — M79674 Pain in right toe(s): Secondary | ICD-10-CM | POA: Diagnosis not present

## 2021-02-13 DIAGNOSIS — B351 Tinea unguium: Secondary | ICD-10-CM

## 2021-02-13 DIAGNOSIS — N181 Chronic kidney disease, stage 1: Secondary | ICD-10-CM

## 2021-02-13 DIAGNOSIS — M79675 Pain in left toe(s): Secondary | ICD-10-CM

## 2021-02-13 DIAGNOSIS — D689 Coagulation defect, unspecified: Secondary | ICD-10-CM

## 2021-02-13 DIAGNOSIS — E1122 Type 2 diabetes mellitus with diabetic chronic kidney disease: Secondary | ICD-10-CM | POA: Diagnosis not present

## 2021-02-13 NOTE — Progress Notes (Signed)
This patient returns to my office for at risk foot care.  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  Patient is taking plavix.  This patient is unable to cut nails himself since the patient cannot reach his nails.These nails are painful walking and wearing shoes.  This patient presents for at risk foot care today.  General Appearance  Alert, conversant and in no acute stress.  Vascular  Dorsalis pedis and posterior tibial  pulses are palpable  bilaterally.  Capillary return is within normal limits  bilaterally. Temperature is within normal limits  bilaterally.  Neurologic  Senn-Weinstein monofilament wire test within normal limits  bilaterally. Muscle power within normal limits bilaterally.  Nails Thick disfigured discolored nails with subungual debris  from hallux to fifth toes bilaterally. No evidence of bacterial infection or drainage bilaterally.  Orthopedic  No limitations of motion  feet .  No crepitus or effusions noted.  No bony pathology or digital deformities noted.  Skin  normotropic skin with no porokeratosis noted bilaterally.  No signs of infections or ulcers noted.     Onychomycosis  Pain in right toes  Pain in left toes  Consent was obtained for treatment procedures.   Mechanical debridement of nails 1-5  bilaterally performed with a nail nipper.  Filed with dremel without incident.    Return office visit   3 months                   Told patient to return for periodic foot care and evaluation due to potential at risk complications.   Gardiner Barefoot DPM

## 2021-02-24 ENCOUNTER — Other Ambulatory Visit: Payer: Self-pay | Admitting: Family Medicine

## 2021-02-24 DIAGNOSIS — E1122 Type 2 diabetes mellitus with diabetic chronic kidney disease: Secondary | ICD-10-CM

## 2021-02-24 DIAGNOSIS — N181 Chronic kidney disease, stage 1: Secondary | ICD-10-CM

## 2021-02-24 MED ORDER — METFORMIN HCL 500 MG PO TABS: ORAL_TABLET | ORAL | 1 refills | Status: DC

## 2021-02-24 NOTE — Telephone Encounter (Signed)
Medication Refill - Medication: metFORMIN (GLUCOPHAGE) 500 MG tablet the direction should be 2x times days. Patient has 3 pills left    Has the patient contacted their pharmacy? Yes.    (Agent: If yes, when and what did the pharmacy advise?) Pharmacy advised insurance will not cover until 4/6 due to it being 1x daily. Patient is requesting a new rx  Preferred Pharmacy (with phone number or street name):   Agent: Please be advised that RX refills may take up to 3 business days. We ask that you follow-up with your pharmacy.

## 2021-02-24 NOTE — Telephone Encounter (Signed)
Requested medication (s) are due for refill today:   Not sure   dose needs clarification  Requested medication (s) are on the active medication list:   Yes  Future visit scheduled:   No  Seen 2 mo. Ago by Simona Huh   Last ordered: 01/04/2021 #90, 1 refill  Clinic note:  Returned because pt says he is supposed to be taking 2 pills a day not one.   As a result he has come up short on his Rx.   His insurance co. Will not cover it since it's written for once a day.   He is requesting a new Rx that pharmacy needs in order to fill the order.    Requested Prescriptions  Pending Prescriptions Disp Refills   metFORMIN (GLUCOPHAGE) 500 MG tablet 90 tablet 1      Endocrinology:  Diabetes - Biguanides Failed - 02/24/2021  9:22 AM      Failed - Cr in normal range and within 360 days    Creatinine, Ser  Date Value Ref Range Status  12/05/2020 0.73 (L) 0.76 - 1.27 mg/dL Final   Creatinine, POC  Date Value Ref Range Status  01/02/2018 NA mg/dL Final          Failed - HBA1C is between 0 and 7.9 and within 180 days    Hgb A1c MFr Bld  Date Value Ref Range Status  12/05/2020 8.0 (H) 4.8 - 5.6 % Final    Comment:             Prediabetes: 5.7 - 6.4          Diabetes: >6.4          Glycemic control for adults with diabetes: <7.0           Passed - eGFR in normal range and within 360 days    GFR calc Af Amer  Date Value Ref Range Status  12/05/2020 97 >59 mL/min/1.73 Final    Comment:    **In accordance with recommendations from the NKF-ASN Task force,**   [facility] is in the process of updating its eGFR calculation to the   2021 CKD-EPI creatinine equation that estimates kidney function   without a race variable.    GFR calc non Af Amer  Date Value Ref Range Status  12/05/2020 84 >59 mL/min/1.73 Final          Passed - Valid encounter within last 6 months    Recent Outpatient Visits           2 months ago Hyperlipidemia associated with type 2 diabetes mellitus [facility])   [place], Vickki Muff, PA-C   5 months ago Controlled type 2 diabetes mellitus with stage 1 chronic kidney disease, without long-term current use of insulin ([place])   [place], PA-C   9 months ago Controlled type 2 diabetes mellitus with stage 1 chronic kidney disease, without long-term current use of insulin ([place])   [employer], Vickki Muff, PA-C   1 year ago Essential hypertension   [employer], Vickki Muff, PA-C   1 year ago [place], [place], [place]

## 2021-02-27 ENCOUNTER — Telehealth: Payer: Self-pay | Admitting: Family Medicine

## 2021-02-27 ENCOUNTER — Other Ambulatory Visit: Payer: Self-pay

## 2021-02-27 DIAGNOSIS — E1122 Type 2 diabetes mellitus with diabetic chronic kidney disease: Secondary | ICD-10-CM

## 2021-02-27 MED ORDER — METFORMIN HCL 500 MG PO TABS: ORAL_TABLET | ORAL | 1 refills | Status: DC

## 2021-02-27 NOTE — Telephone Encounter (Signed)
Pt daughter, Weyman Croon is calling regarding metFORMIN (GLUCOPHAGE) 500 MG tablet [[id number]] the sig states that the patient is to take one tablet a day. Pt was advised by Simona Huh to take 2 pills a day. Pt is out of medication. Pts daughter is requesting a new script with new sig. Please advise CB- [place], [place].

## 2021-02-28 NOTE — Telephone Encounter (Signed)
Pts daughter called and is requesting to have a call back. She states that the pt is completely out of this medication. Please advise.

## 2021-02-28 NOTE — Telephone Encounter (Signed)
Advised 

## 2021-03-01 ENCOUNTER — Other Ambulatory Visit: Payer: Self-pay | Admitting: Family Medicine

## 2021-03-01 NOTE — Telephone Encounter (Signed)
Patient's daughter Otho Perl called today and was very upset with the practice.  She states that she called on Monday to inform the office the patient ran out of his medication.  Patient was instructed to increase dose from 1 daily to twice daily.  At the time he was instructed there was no change on the medication list and no new prescription was sent to the pharmacy.  Therefore, he ran out early and would need the new prescription sent in.   On Monday the prescription was sent in but the daughter was not notified by our office the request had been completed so she did not get the prescription.  On Tuesday she called back requesting a call back for information on the status of the refill.  Call was made to patient instead of the daughter. Daughter then called on Wednesday to say she was very upset with the practice and wanted to speak with the Environmental education officer.  I advised her that the practice manager was out with a sick child and I would try to help her.   The daughter said that the messages should have been very specific that she wanted to be called when the prescription was sent in.  Then after the second call it should have been specific that the call was to go to her and not the patient.  She was most upset because there were no notations on the medicine list of the change in dosing after the patient was instructed to change his dosing.  She was most adamant that this was on the provider I assured the daughter I would relay the information to the Environmental education officer and have an assessment done of the messages.   Daughter was calm at the end of the conversation and thanked me for addressing her concerns.

## 2021-03-06 ENCOUNTER — Other Ambulatory Visit: Payer: Self-pay

## 2021-03-06 DIAGNOSIS — E1122 Type 2 diabetes mellitus with diabetic chronic kidney disease: Secondary | ICD-10-CM

## 2021-03-06 DIAGNOSIS — N181 Chronic kidney disease, stage 1: Secondary | ICD-10-CM

## 2021-03-06 MED ORDER — METFORMIN HCL 500 MG PO TABS: ORAL_TABLET | ORAL | 1 refills | Status: DC

## 2021-03-28 ENCOUNTER — Encounter: Payer: Self-pay | Admitting: Family Medicine

## 2021-03-28 ENCOUNTER — Ambulatory Visit (INDEPENDENT_AMBULATORY_CARE_PROVIDER_SITE_OTHER): Payer: PPO | Admitting: Family Medicine

## 2021-03-28 DIAGNOSIS — R059 Cough, unspecified: Secondary | ICD-10-CM | POA: Diagnosis not present

## 2021-03-28 MED ORDER — AZITHROMYCIN 250 MG PO TABS: ORAL_TABLET | ORAL | 0 refills | Status: DC

## 2021-03-28 MED ORDER — PREDNISONE 20 MG PO TABS: 20.0000 mg | ORAL_TABLET | Freq: Every day | ORAL | 0 refills | Status: DC

## 2021-03-28 NOTE — Progress Notes (Signed)
Virtual telephone visit    Virtual Visit via Telephone Note   This visit type was conducted due to national recommendations for restrictions regarding the COVID-19 Pandemic (e.g. social distancing) in an effort to limit this patient's exposure and mitigate transmission in our community. Due to his co-morbid illnesses, this patient is at least at moderate risk for complications without adequate follow up. This format is felt to be most appropriate for this patient at this time. The patient did not have access to video technology or had technical difficulties with video requiring transitioning to audio format only (telephone). Physical exam was limited to content and character of the telephone converstion.    Patient location: Home with no family members present Provider location: Work office  I discussed the limitations of evaluation and management by telemedicine and the availability of in person appointments. The patient expressed understanding and agreed to proceed.   Visit Date: 03/28/2021  Today's healthcare provider: Laurita Quint Sarim Rothman, FNP    Chief Complaint  Patient presents with  . Cough   I,Porsha C McClurkin,acting as a Education administrator for [employer], FNP.,have documented all relevant documentation on the behalf of Laurita Quint Giann Obara, FNP,as directed by  Laurita Quint Leslie Langille, FNP while in the presence of Laurita Quint Hailie Searight, FNP.  Subjective    Cough This is a recurrent problem. The current episode started more than 1 month ago. The problem has been gradually worsening. The problem occurs constantly. The cough is productive of sputum. Associated symptoms include nasal congestion, postnasal drip and rhinorrhea. Pertinent negatives include no chills, fever, sore throat, shortness of breath or wheezing. Treatments tried: cough drops. The treatment provided no relief. There is no history of asthma, bronchiectasis, bronchitis, COPD, emphysema, environmental allergies or pneumonia.      Has had symptoms  for greater than 2 months  Medications: Outpatient Medications Prior to Visit  Medication Sig  . glucose blood (ONETOUCH ULTRA) test strip USE TO TEST BLOOD SUGAR EVERY DAY AS DIRECTED  . LIFESCAN FINEPOINT LANCETS MISC Test fasting blood sugar once each morning.  . metFORMIN (GLUCOPHAGE) 500 MG tablet TAKE 1 TABLET twice daily  . metoprolol succinate (TOPROL-XL) 25 MG 24 hr tablet TAKE 1/2 TABLET BY MOUTH EVERY DAY  . Multiple Vitamin (MULTIVITAMIN) tablet Take 1 tablet by mouth daily.  . Multiple Vitamins-Minerals (ICAPS AREDS 2 PO) Take 1 capsule by mouth daily.  . naproxen sodium (ALEVE) 220 MG tablet Take 220 mg by mouth.  . naproxen sodium (ANAPROX) 220 MG tablet Take 220 mg by mouth daily as needed.  . nitroGLYCERIN (NITROSTAT) 0.4 MG SL tablet Place 1 tablet (0.4 mg total) under the tongue every 5 (five) minutes as needed for chest pain.  . Omega-3 Fatty Acids (FISH OIL) 1000 MG CAPS Take 1,000 mg by mouth daily.  . pantoprazole (PROTONIX) 40 MG tablet TAKE 1 TABLET ON MON WED AND FRI OF EACHWEEK  . ramipril (ALTACE) 5 MG capsule TAKE 1 CAPSULE BY MOUTH ONCE DAILY  . rosuvastatin (CRESTOR) 10 MG tablet TAKE 1 TABLET BY MOUTH DAILY  . apixaban (ELIQUIS) 5 MG TABS tablet Take 1 tablet (5 mg total) by mouth 2 (two) times daily.  . APPLE CIDER VINEGAR PO Take by mouth daily. (Patient not taking: Reported on 03/28/2021)  . cholecalciferol (VITAMIN D) 1000 UNITS tablet Take 1,000 Units by mouth daily. (Patient not taking: Reported on 03/28/2021)   No facility-administered medications prior to visit.    Review of Systems  Constitutional: Negative for  chills, fatigue and fever.  HENT: Positive for congestion, postnasal drip and rhinorrhea. Negative for sinus pressure, sinus pain and sore throat.   Respiratory: Positive for cough. Negative for chest tightness, shortness of breath and wheezing.   Gastrointestinal: Negative for diarrhea, nausea and vomiting.  Allergic/Immunologic: Negative  for environmental allergies.     Objective    There were no vitals taken for this visit. Constitutional:      General: He is not in acute distress. Pulmonary:     Effort: Pulmonary effort is normal. No respiratory distress.  Neurological:     Mental Status: He is alert and oriented to person, place, and time.  Psychiatric:        Mood and Affect: Mood normal.        Behavior: Behavior normal.      Assessment & Plan     Problem List Items Addressed This Visit   None   Visit Diagnoses    Cough    -  Primary   Relevant Medications   azithromycin (ZITHROMAX) 250 MG tablet   predniSONE (DELTASONE) 20 MG tablet      Return if symptoms worsen or fail to improve.   I discussed the assessment and treatment plan with the patient. The patient was provided an opportunity to ask questions and all were answered. The patient agreed with the plan and demonstrated an understanding of the instructions.   The patient was advised to call back or seek an in-person evaluation if the symptoms worsen or if the condition fails to improve as anticipated.  I provided 20 minutes of non-face-to-face time during this encounter.   [place], [place] [phone] (phone) [phone] (fax)  [place]

## 2021-03-28 NOTE — Patient Instructions (Signed)

## 2021-05-15 ENCOUNTER — Telehealth: Payer: PPO | Admitting: Emergency Medicine

## 2021-05-15 ENCOUNTER — Other Ambulatory Visit: Payer: Self-pay

## 2021-05-15 ENCOUNTER — Telehealth: Payer: Self-pay

## 2021-05-15 ENCOUNTER — Other Ambulatory Visit: Payer: Self-pay | Admitting: *Deleted

## 2021-05-15 DIAGNOSIS — U071 COVID-19: Secondary | ICD-10-CM

## 2021-05-15 MED ORDER — MOLNUPIRAVIR EUA 200MG CAPSULE: 4.0000 | ORAL_CAPSULE | Freq: Two times a day (BID) | ORAL | 0 refills | Status: AC

## 2021-05-15 NOTE — Telephone Encounter (Signed)
Pts daughter is calling back to check on the status of medication being sent to the pharmacy today for the pt. Pt had evisit today and was told that Dr. Rosanna Randy was going to send meds in. [phone]

## 2021-05-15 NOTE — Telephone Encounter (Signed)
Kidney function good from last lab draw 5 months ago.  Can try fat Paxlovid at regular dose.

## 2021-05-15 NOTE — Telephone Encounter (Signed)
Patient's daughter is returning a call to the assistant.  She would like to know if any medication was sent to the pharmacy.  Please advise and call daughter to let her know so she can pick it up.  CB# [phone]

## 2021-05-15 NOTE — Progress Notes (Signed)
Spoke with pt's power of attorney.  She is going to try to call PCP back again as it looks like pt's PCP has decided on a treatment plan for the patient.     Agreed to call back for Video Visit if needed.

## 2021-05-15 NOTE — Telephone Encounter (Signed)
Copied from [place] [id number]. Topic: General - Other >> May 15, 2021  8:20 AM Keene Breath wrote: Reason for CRM: Patient's daughter would like a call back from the nurse or doctor regarding patient's positive covid test on Saturday.  She stated patient is not doing well and would like to know if there is some medication that can be prescribed.  Please call asap to discuss at [phone] Weyman Croon, daughter)

## 2021-05-15 NOTE — Telephone Encounter (Signed)
When did symptoms start--might be candidate for outpatient infusion therapy

## 2021-05-15 NOTE — Telephone Encounter (Signed)
Please review. Thanks!  

## 2021-05-15 NOTE — Progress Notes (Signed)
Patient on Eliquis so Rx was sent in for the [facility] instead of the [facility] [place].  He was also instructed to hold his Rosuvastatin while on the medication

## 2021-05-15 NOTE — Telephone Encounter (Signed)
Spoke with patients daughter on the phone symptoms began Saturday (05/13/21) around 4pm. Patient complains of cough sore throat and runny nose.Patient tested positive after taking test Saturday evening, daughter wants to know if patient can be started on antiviral drug, she states that they use [facility] in [place]

## 2021-05-15 NOTE — Telephone Encounter (Signed)
Pt daughter Otho Perl is calling back and would like a nurse to return her call and also would like her dad to have some medication sent to pharm today [facility] in [place] [address]

## 2021-05-16 NOTE — Telephone Encounter (Signed)
Unable to leave voice message mailbox is full, according to chart and e-receipt medication was received by pharmacy as of 4:48PM. When daughter calls back please clarify if prescription was picked up or not? KW

## 2021-05-16 NOTE — Telephone Encounter (Signed)
Pt daughter states meds were pu at 8:00 pm last nite

## 2021-05-22 ENCOUNTER — Ambulatory Visit: Payer: PPO | Admitting: Podiatry

## 2021-05-29 ENCOUNTER — Other Ambulatory Visit: Payer: Self-pay

## 2021-05-29 ENCOUNTER — Ambulatory Visit: Payer: PPO | Admitting: Podiatry

## 2021-05-29 ENCOUNTER — Encounter: Payer: Self-pay | Admitting: Podiatry

## 2021-05-29 DIAGNOSIS — E1122 Type 2 diabetes mellitus with diabetic chronic kidney disease: Secondary | ICD-10-CM

## 2021-05-29 DIAGNOSIS — M79674 Pain in right toe(s): Secondary | ICD-10-CM

## 2021-05-29 DIAGNOSIS — N181 Chronic kidney disease, stage 1: Secondary | ICD-10-CM | POA: Diagnosis not present

## 2021-05-29 DIAGNOSIS — D689 Coagulation defect, unspecified: Secondary | ICD-10-CM | POA: Diagnosis not present

## 2021-05-29 DIAGNOSIS — B351 Tinea unguium: Secondary | ICD-10-CM

## 2021-05-29 DIAGNOSIS — M79675 Pain in left toe(s): Secondary | ICD-10-CM

## 2021-05-29 NOTE — Progress Notes (Signed)
This patient returns to my office for at risk foot care.  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  Patient is taking plavix.  This patient is unable to cut nails himself since the patient cannot reach his nails.These nails are painful walking and wearing shoes.  This patient presents for at risk foot care today.  General Appearance  Alert, conversant and in no acute stress.  Vascular  Dorsalis pedis and posterior tibial  pulses are palpable  bilaterally.  Capillary return is within normal limits  bilaterally. Temperature is within normal limits  bilaterally.  Neurologic  Senn-Weinstein monofilament wire test within normal limits  bilaterally. Muscle power within normal limits bilaterally.  Nails Thick disfigured discolored nails with subungual debris  from hallux to fifth toes bilaterally. No evidence of bacterial infection or drainage bilaterally.  Orthopedic  No limitations of motion  feet .  No crepitus or effusions noted.  No bony pathology or digital deformities noted.  Skin  normotropic skin with no porokeratosis noted bilaterally.  No signs of infections or ulcers noted.     Onychomycosis  Pain in right toes  Pain in left toes  Consent was obtained for treatment procedures.   Mechanical debridement of nails 1-5  bilaterally performed with a nail nipper.  Filed with dremel without incident.    Return office visit   3 months                   Told patient to return for periodic foot care and evaluation due to potential at risk complications.   Gardiner Barefoot DPM

## 2021-06-07 ENCOUNTER — Other Ambulatory Visit: Payer: Self-pay

## 2021-06-07 MED ORDER — RAMIPRIL 5 MG PO CAPS: 5.0000 mg | ORAL_CAPSULE | Freq: Every day | ORAL | 2 refills | Status: DC

## 2021-06-19 ENCOUNTER — Ambulatory Visit (INDEPENDENT_AMBULATORY_CARE_PROVIDER_SITE_OTHER): Payer: PPO | Admitting: Family Medicine

## 2021-06-19 ENCOUNTER — Other Ambulatory Visit: Payer: Self-pay

## 2021-06-19 ENCOUNTER — Encounter: Payer: Self-pay | Admitting: Family Medicine

## 2021-06-19 DIAGNOSIS — N181 Chronic kidney disease, stage 1: Secondary | ICD-10-CM

## 2021-06-19 DIAGNOSIS — E1169 Type 2 diabetes mellitus with other specified complication: Secondary | ICD-10-CM

## 2021-06-19 DIAGNOSIS — I10 Essential (primary) hypertension: Secondary | ICD-10-CM

## 2021-06-19 DIAGNOSIS — E785 Hyperlipidemia, unspecified: Secondary | ICD-10-CM

## 2021-06-19 DIAGNOSIS — Z9861 Coronary angioplasty status: Secondary | ICD-10-CM | POA: Diagnosis not present

## 2021-06-19 DIAGNOSIS — I2510 Atherosclerotic heart disease of native coronary artery without angina pectoris: Secondary | ICD-10-CM

## 2021-06-19 DIAGNOSIS — E1122 Type 2 diabetes mellitus with diabetic chronic kidney disease: Secondary | ICD-10-CM

## 2021-06-19 MED ORDER — METOPROLOL SUCCINATE ER 25 MG PO TB24: 25.0000 mg | ORAL_TABLET | Freq: Every day | ORAL | 3 refills | Status: DC

## 2021-06-19 NOTE — Progress Notes (Signed)
Established patient visit   Patient: Charles Sellers.   DOB: 08-11-1932   87 y.o. Male  MRN: [id number] Visit Date: 06/19/2021  Today's healthcare provider: [name], PA-C   Chief Complaint  Patient presents with   Diabetes   Hyperlipidemia   Subjective    HPI  Lipid/Cholesterol, Follow-up  Last lipid panel Other pertinent labs  Lab Results  Component Value Date   CHOL 124 12/05/2020   HDL 36 (L) 12/05/2020   LDLCALC 76 12/05/2020   TRIG 52 12/05/2020   CHOLHDL 3.4 12/05/2020   Lab Results  Component Value Date   ALT 19 12/05/2020   AST 19 12/05/2020   PLT 169 12/05/2020   TSH 1.150 01/26/2020     He was last seen for this 6 months ago.  Management since that visit includes none, rechecked labs and encouraged a low fat diabetic diet patient to continue Crestor.  He reports excellent compliance with treatment. He is not having side effects.   Symptoms: No chest pain No chest pressure/discomfort  No dyspnea Yes lower extremity edema  No numbness or tingling of extremity No orthopnea  No palpitations No paroxysmal nocturnal dyspnea  No speech difficulty No syncope   Current diet: in general, an "unhealthy" diet Current exercise: none  The ASCVD Risk score (Page., et al., 2013) failed to calculate for the following reasons:   The 2013 ASCVD risk score is only valid for ages 15 to 43  ---------------------------------------------------------------------------------------------------  Diabetes Mellitus Type II, Follow-up  Lab Results  Component Value Date   HGBA1C 8.0 (H) 12/05/2020   HGBA1C 7.5 (H) 09/01/2020   HGBA1C 7.0 (A) 05/30/2020   Wt Readings from Last 3 Encounters:  12/05/20 181 lb (82.1 kg)  10/11/20 178 lb 9.6 oz (81 kg)  09/01/20 176 lb (79.8 kg)   Last seen for diabetes 6 months ago.  Management since then includes patient to continue Metformin 500mg  BID. He reports excellent compliance with treatment. He is not having  side effects.  Symptoms: No fatigue No foot ulcerations  No appetite changes No nausea  No paresthesia of the feet  No polydipsia  No polyuria No visual disturbances   No vomiting     Home blood sugar records: fasting range: 138  Episodes of hypoglycemia? No    Current insulin regiment: none Most Recent Eye Exam: 11/23/2020 Current exercise: none Current diet habits: in general, an "unhealthy" diet  Pertinent Labs: Lab Results  Component Value Date   CHOL 124 12/05/2020   HDL 36 (L) 12/05/2020   LDLCALC 76 12/05/2020   TRIG 52 12/05/2020   CHOLHDL 3.4 12/05/2020   Lab Results  Component Value Date   NA 140 12/05/2020   K 4.2 12/05/2020   CREATININE 0.73 (L) 12/05/2020   GFRNONAA 84 12/05/2020   GFRAA 97 12/05/2020   GLUCOSE 149 (H) 12/05/2020     ---------------------------------------------------------------------------------------------------   Patient Active Problem List   Diagnosis Date Noted   New onset atrial fibrillation ([place]) 10/11/2020   Coagulation defect ([place]) 09/01/2020   Pain due to onychomycosis of toenails of both feet 07/06/2019   Mild aortic stenosis 09/04/2017   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   Mitral regurgitation 07/30/2017   History of prolonged Q-T interval on ECG 06/22/2015   Prostate lump 06/22/2015   Tumoral calcinosis 06/22/2015   Diabetes mellitus, type 2 ([place]) 06/22/2015   CAD S/P percutaneous coronary angioplasty    Diabetes mellitus type II,  controlled ([place])    Hyperlipidemia associated with type 2 diabetes mellitus ([place])    Essential hypertension    Arthropathia 02/17/2010   Chondrocalcinosis of multiple sites 08/11/2008   Acid reflux 08/11/2008   Cannot sleep 07/23/2008   UNSPECIFIED INFLAMMATORY POLYARTHROPATHY 07/20/2008   MYOSITIS 07/20/2008   FEVER UNSPECIFIED 07/20/2008   EXANTHEM 07/20/2008   Muscle ache 07/16/2008   Disorder of sacrum 06/19/2008   Thyrotoxicosis 06/19/2008   Social History   Tobacco  Use   Smoking status: Never   Smokeless tobacco: Never  Vaping Use   Vaping Use: Never used  Substance Use Topics   Alcohol use: No    Alcohol/week: 0.0 standard drinks   Drug use: No   Allergies  Allergen Reactions   Niaspan [Niacin Er] Rash   Penicillins Rash   Medications: Outpatient Medications Prior to Visit  Medication Sig   glucose blood (ONETOUCH ULTRA) test strip USE TO TEST BLOOD SUGAR EVERY DAY AS DIRECTED   LIFESCAN FINEPOINT LANCETS MISC Test fasting blood sugar once each morning.   metFORMIN (GLUCOPHAGE) 500 MG tablet TAKE 1 TABLET twice daily   metoprolol succinate (TOPROL-XL) 25 MG 24 hr tablet TAKE 1/2 TABLET BY MOUTH EVERY DAY   Multiple Vitamin (MULTIVITAMIN) tablet Take 1 tablet by mouth daily.   Multiple Vitamins-Minerals (ICAPS AREDS 2 PO) Take 1 capsule by mouth daily.   naproxen sodium (ALEVE) 220 MG tablet Take 220 mg by mouth.   naproxen sodium (ANAPROX) 220 MG tablet Take 220 mg by mouth daily as needed.   nitroGLYCERIN (NITROSTAT) 0.4 MG SL tablet Place 1 tablet (0.4 mg total) under the tongue every 5 (five) minutes as needed for chest pain.   Omega-3 Fatty Acids (FISH OIL) 1000 MG CAPS Take 1,000 mg by mouth daily.   pantoprazole (PROTONIX) 40 MG tablet TAKE 1 TABLET ON MON WED AND FRI OF EACHWEEK   ramipril (ALTACE) 5 MG capsule Take 1 capsule (5 mg total) by mouth daily.   rosuvastatin (CRESTOR) 10 MG tablet TAKE 1 TABLET BY MOUTH DAILY   apixaban (ELIQUIS) 5 MG TABS tablet Take 1 tablet (5 mg total) by mouth 2 (two) times daily.   APPLE CIDER VINEGAR PO Take by mouth daily. (Patient not taking: No sig reported)   cholecalciferol (VITAMIN D) 1000 UNITS tablet Take 1,000 Units by mouth daily. (Patient not taking: No sig reported)   [DISCONTINUED] predniSONE (DELTASONE) 20 MG tablet Take 1 tablet (20 mg total) by mouth daily with breakfast.   No facility-administered medications prior to visit.    Review of Systems  Constitutional: Negative.    HENT: Negative.    Respiratory: Negative.    Cardiovascular: Negative.   Gastrointestinal: Negative.        Objective    There were no vitals taken for this visit. BP Readings from Last 3 Encounters:  12/05/20 (!) 151/86  10/11/20 140/88  09/01/20 140/85   Wt Readings from Last 3 Encounters:  12/05/20 181 lb (82.1 kg)  10/11/20 178 lb 9.6 oz (81 kg)  09/01/20 176 lb (79.8 kg)    Physical Exam Constitutional:      General: He is not in acute distress.    Appearance: He is well-developed.  HENT:     Head: Normocephalic and atraumatic.     Right Ear: Hearing normal.     Left Ear: Hearing normal.     Nose: Nose normal.  Eyes:     General: Lids are normal. No scleral icterus.  Right eye: No discharge.        Left eye: No discharge.     Conjunctiva/sclera: Conjunctivae normal.  Cardiovascular:     Rate and Rhythm: Normal rate and regular rhythm.     Pulses: Normal pulses.     Heart sounds: Normal heart sounds.  Pulmonary:     Effort: Pulmonary effort is normal. No respiratory distress.     Breath sounds: Normal breath sounds.  Abdominal:     General: Bowel sounds are normal.     Palpations: Abdomen is soft.  Musculoskeletal:        General: Normal range of motion.     Cervical back: Neck supple.  Skin:    Findings: No lesion or rash.  Neurological:     Mental Status: He is alert and oriented to person, place, and time.  Psychiatric:        Speech: Speech normal.        Behavior: Behavior normal.        Thought Content: Thought content normal.    Diabetic Foot Form - Detailed   Diabetic Foot Exam - detailed  06/19/2021 10:00 AM  Visual Foot Exam completed.: Yes  Can the patient see the bottom of their feet?: Yes Are the shoes appropriate in style and fit?: Yes Is there swelling or and abnormal foot shape?: No Is there a claw toe deformity?: No Is there elevated skin temparature?: No Is there foot or ankle muscle weakness?: No Normal Range of Motion:  Yes Pulse Foot Exam completed.: Yes   Right posterior Tibialias: Present Left posterior Tibialias: Present   Right Dorsalis Pedis: Present Left Dorsalis Pedis: Present  Sensory Foot Exam Completed.: Yes Semmes-Weinstein Monofilament Test R Site 1-Great Toe: Pos L Site 1-Great Toe: Pos           No results found for any visits on 06/19/21.  Assessment & Plan     1. Controlled type 2 diabetes mellitus with stage 1 chronic kidney disease, without long-term current use of insulin (HCC) Hgb A1C is 7.3% today (was 8.0 on 12-05-20). Still taking Metformin 500 mg BID. Has not been eating as well as when his wife was alive. Counseled regarding bereavement and need to maintain diabetic diet. Recheck follow up labs. - POCT glycosylated hemoglobin (Hb A1C) - CBC with Differential/Platelet - Comprehensive metabolic panel - Lipid panel  2. Hyperlipidemia associated with type 2 diabetes mellitus (HCC) Cholesterol and triglycerides in good shape except HDl a little low in the past year or two. Continues to take Crestor 10 mg qd without side effects. Recheck follow up labs. - CBC with Differential/Platelet - Comprehensive metabolic panel - Lipid panel - TSH  3. CAD S/P percutaneous coronary angioplasty Followed by Dr. Ellyn Hack (cardiologist) with history of DES stents  in 2005. Denies chest pains or palpitations recently. 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 Has not had to use nitroglycerin this year.Still on the Eliquis 5 mg qd. - CBC with Differential/Platelet - Comprehensive metabolic panel - Lipid panel - TSH - metoprolol succinate (TOPROL-XL) 25 MG 24 hr tablet; Take 1 tablet (25 mg total) by mouth daily.  Dispense: 90 tablet; Refill: 3  4. Essential hypertension BP high today. Needs refill of Metoprolol Succinate 25 mg qd and recheck of follow up labs. Continues Ramipril 5 mg qdHas been a little depressed since his wife died recently.  - CBC with  Differential/Platelet - Comprehensive metabolic panel - Lipid panel - TSH - metoprolol succinate (  TOPROL-XL) 25 MG 24 hr tablet; Take 1 tablet (25 mg total) by mouth daily.  Dispense: 90 tablet; Refill: 3   No follow-ups on file.      I, Joh Rao, PA-C, have reviewed all documentation for this visit. The documentation on 06/20/21 for the exam, diagnosis, procedures, and orders are all accurate and complete.    [name], PA-C  [employer] [phone] (phone) [phone] (fax)  [place]

## 2021-06-19 NOTE — Patient Instructions (Signed)
Diabetes Mellitus and Nutrition, Adult When you have diabetes, or diabetes mellitus, it is very important to have healthy eating habits because your blood sugar (glucose) levels are greatly affected by what you eat and drink. Eating healthy foods in the right amounts, at about the same times every day, can help you:  Control your blood glucose.  Lower your risk of heart disease.  Improve your blood pressure.  Reach or maintain a healthy weight. What can affect my meal plan? Every person with diabetes is different, and each person has different needs for a meal plan. Your health care provider may recommend that you work with a dietitian to make a meal plan that is best for you. Your meal plan may vary depending on factors such as:  The calories you need.  The medicines you take.  Your weight.  Your blood glucose, blood pressure, and cholesterol levels.  Your activity level.  Other health conditions you have, such as heart or kidney disease. How do carbohydrates affect me? Carbohydrates, also called carbs, affect your blood glucose level more than any other type of food. Eating carbs naturally raises the amount of glucose in your blood. Carb counting is a method for keeping track of how many carbs you eat. Counting carbs is important to keep your blood glucose at a healthy level, especially if you use insulin or take certain oral diabetes medicines. It is important to know how many carbs you can safely have in each meal. This is different for every person. Your dietitian can help you calculate how many carbs you should have at each meal and for each snack. How does alcohol affect me? Alcohol can cause a sudden decrease in blood glucose (hypoglycemia), especially if you use insulin or take certain oral diabetes medicines. Hypoglycemia can be a life-threatening condition. Symptoms of hypoglycemia, such as sleepiness, dizziness, and confusion, are similar to symptoms of having too much  alcohol.  Do not drink alcohol if: ? Your health care provider tells you not to drink. ? You are pregnant, may be pregnant, or are planning to become pregnant.  If you drink alcohol: ? Do not drink on an empty stomach. ? Limit how much you use to:  0-1 drink a day for women.  0-2 drinks a day for men. ? Be aware of how much alcohol is in your drink. In the [place] one drink equals one 12 oz bottle of beer (355 mL), one 5 oz glass of wine (148 mL), or one 1 oz glass of hard liquor (44 mL). ? Keep yourself hydrated with water, diet soda, or unsweetened iced tea.  Keep in mind that regular soda, juice, and other mixers may contain a lot of sugar and must be counted as carbs. What are tips for following this plan? Reading food labels  Start by checking the serving size on the "Nutrition Facts" label of packaged foods and drinks. The amount of calories, carbs, fats, and other nutrients listed on the label is based on one serving of the item. Many items contain more than one serving per package.  Check the total grams (g) of carbs in one serving. You can calculate the number of servings of carbs in one serving by dividing the total carbs by 15. For example, if a food has 30 g of total carbs per serving, it would be equal to 2 servings of carbs.  Check the number of grams (g) of saturated fats and trans fats in one serving. Choose foods that have   a low amount or none of these fats.  Check the number of milligrams (mg) of salt (sodium) in one serving. Most people should limit total sodium intake to less than 2,300 mg per day.  Always check the nutrition information of foods labeled as "low-fat" or "nonfat." These foods may be higher in added sugar or refined carbs and should be avoided.  Talk to your dietitian to identify your daily goals for nutrients listed on the label. Shopping  Avoid buying canned, pre-made, or processed foods. These foods tend to be high in fat, sodium, and added  sugar.  Shop around the outside edge of the grocery store. This is where you will most often find fresh fruits and vegetables, bulk grains, fresh meats, and fresh dairy. Cooking  Use low-heat cooking methods, such as baking, instead of high-heat cooking methods like deep frying.  Cook using healthy oils, such as olive, canola, or sunflower oil.  Avoid cooking with butter, cream, or high-fat meats. Meal planning  Eat meals and snacks regularly, preferably at the same times every day. Avoid going long periods of time without eating.  Eat foods that are high in fiber, such as fresh fruits, vegetables, beans, and whole grains. Talk with your dietitian about how many servings of carbs you can eat at each meal.  Eat 4-6 oz (112-168 g) of lean protein each day, such as lean meat, chicken, fish, eggs, or tofu. One ounce (oz) of lean protein is equal to: ? 1 oz (28 g) of meat, chicken, or fish. ? 1 egg. ?  cup (62 g) of tofu.  Eat some foods each day that contain healthy fats, such as avocado, nuts, seeds, and fish.   What foods should I eat? Fruits Berries. Apples. Oranges. Peaches. Apricots. Plums. Grapes. Mango. Papaya. Pomegranate. Kiwi. Cherries. Vegetables Lettuce. Spinach. Leafy greens, including kale, chard, collard greens, and mustard greens. Beets. Cauliflower. Cabbage. Broccoli. Carrots. Green beans. Tomatoes. Peppers. Onions. Cucumbers. Brussels sprouts. Grains Whole grains, such as whole-wheat or whole-grain bread, crackers, tortillas, cereal, and pasta. Unsweetened oatmeal. Quinoa. Brown or wild rice. Meats and other proteins Seafood. Poultry without skin. Lean cuts of poultry and beef. Tofu. Nuts. Seeds. Dairy Low-fat or fat-free dairy products such as milk, yogurt, and cheese. The items listed above may not be a complete list of foods and beverages you can eat. Contact a dietitian for more information. What foods should I avoid? Fruits Fruits canned with  syrup. Vegetables Canned vegetables. Frozen vegetables with butter or cream sauce. Grains Refined white flour and flour products such as bread, pasta, snack foods, and cereals. Avoid all processed foods. Meats and other proteins Fatty cuts of meat. Poultry with skin. Breaded or fried meats. Processed meat. Avoid saturated fats. Dairy Full-fat yogurt, cheese, or milk. Beverages Sweetened drinks, such as soda or iced tea. The items listed above may not be a complete list of foods and beverages you should avoid. Contact a dietitian for more information. Questions to ask a health care provider  Do I need to meet with a diabetes educator?  Do I need to meet with a dietitian?  What number can I call if I have questions?  When are the best times to check my blood glucose? Where to find more information:  American Diabetes Association: diabetes.org  Academy of Nutrition and Dietetics: www.eatright.org  National Institute of Diabetes and Digestive and Kidney Diseases: www.niddk.nih.gov  Association of Diabetes Care and Education Specialists: www.diabeteseducator.org Summary  It is important to have healthy eating   habits because your blood sugar (glucose) levels are greatly affected by what you eat and drink.  A healthy meal plan will help you control your blood glucose and maintain a healthy lifestyle.  Your health care provider may recommend that you work with a dietitian to make a meal plan that is best for you.  Keep in mind that carbohydrates (carbs) and alcohol have immediate effects on your blood glucose levels. It is important to count carbs and to use alcohol carefully. This information is not intended to replace advice given to you by your health care provider. Make sure you discuss any questions you have with your health care provider. Document Revised: 11/03/2019 Document Reviewed: 11/03/2019 Elsevier Patient Education  2021 [employer]  

## 2021-06-20 LAB — COMPREHENSIVE METABOLIC PANEL
ALT: 16 IU/L (ref 0–44)
AST: 21 IU/L (ref 0–40)
Albumin/Globulin Ratio: 1.6 (ref 1.2–2.2)
Albumin: 4.4 g/dL (ref 3.6–4.6)
Alkaline Phosphatase: 85 IU/L (ref 44–121)
BUN/Creatinine Ratio: 21 (ref 10–24)
BUN: 15 mg/dL (ref 8–27)
Bilirubin Total: 1.2 mg/dL (ref 0.0–1.2)
CO2: 22 mmol/L (ref 20–29)
Calcium: 8.8 mg/dL (ref 8.6–10.2)
Chloride: 101 mmol/L (ref 96–106)
Creatinine, Ser: 0.72 mg/dL — ABNORMAL LOW (ref 0.76–1.27)
Globulin, Total: 2.8 g/dL (ref 1.5–4.5)
Glucose: 123 mg/dL — ABNORMAL HIGH (ref 65–99)
Potassium: 4.7 mmol/L (ref 3.5–5.2)
Sodium: 141 mmol/L (ref 134–144)
Total Protein: 7.2 g/dL (ref 6.0–8.5)
eGFR: 88 mL/min/{1.73_m2} (ref >59)

## 2021-06-20 LAB — LIPID PANEL
Chol/HDL Ratio: 3.4 ratio (ref 0.0–5.0)
Cholesterol, Total: 122 mg/dL (ref 100–199)
HDL: 36 mg/dL — ABNORMAL LOW (ref >39)
LDL Chol Calc (NIH): 74 mg/dL (ref 0–99)
Triglycerides: 52 mg/dL (ref 0–149)
VLDL Cholesterol Cal: 12 mg/dL (ref 5–40)

## 2021-06-20 LAB — CBC WITH DIFFERENTIAL/PLATELET
Basophils Absolute: 0.1 10*3/uL (ref 0.0–0.2)
Basos: 1 %
EOS (ABSOLUTE): 0.2 10*3/uL (ref 0.0–0.4)
Eos: 3 %
Hematocrit: 44.3 % (ref 37.5–51.0)
Hemoglobin: 14.9 g/dL (ref 13.0–17.7)
Immature Grans (Abs): 0 10*3/uL (ref 0.0–0.1)
Immature Granulocytes: 0 %
Lymphocytes Absolute: 1.2 10*3/uL (ref 0.7–3.1)
Lymphs: 17 %
MCH: 28.9 pg (ref 26.6–33.0)
MCHC: 33.6 g/dL (ref 31.5–35.7)
MCV: 86 fL (ref 79–97)
Monocytes Absolute: 0.8 10*3/uL (ref 0.1–0.9)
Monocytes: 12 %
Neutrophils Absolute: 4.9 10*3/uL (ref 1.4–7.0)
Neutrophils: 67 %
Platelets: 167 10*3/uL (ref 150–450)
RBC: 5.16 x10E6/uL (ref 4.14–5.80)
RDW: 13.7 % (ref 11.6–15.4)
WBC: 7.2 10*3/uL (ref 3.4–10.8)

## 2021-06-20 LAB — TSH: TSH: 1.45 u[IU]/mL (ref 0.450–4.500)

## 2021-07-04 ENCOUNTER — Other Ambulatory Visit: Payer: Self-pay | Admitting: Cardiology

## 2021-07-27 ENCOUNTER — Other Ambulatory Visit: Payer: Self-pay

## 2021-07-27 MED ORDER — ROSUVASTATIN CALCIUM 10 MG PO TABS: 10.0000 mg | ORAL_TABLET | Freq: Every day | ORAL | 1 refills | Status: DC

## 2021-08-08 ENCOUNTER — Ambulatory Visit (INDEPENDENT_AMBULATORY_CARE_PROVIDER_SITE_OTHER): Payer: PPO | Admitting: Family Medicine

## 2021-08-08 ENCOUNTER — Encounter: Payer: Self-pay | Admitting: Family Medicine

## 2021-08-08 ENCOUNTER — Ambulatory Visit
Admission: RE | Admit: 2021-08-08 | Discharge: 2021-08-08 | Disposition: A | Discharge status: Home / Self Care | Payer: PPO | Attending: Family Medicine | Admitting: Family Medicine

## 2021-08-08 ENCOUNTER — Other Ambulatory Visit: Payer: Self-pay

## 2021-08-08 ENCOUNTER — Ambulatory Visit
Admission: RE | Admit: 2021-08-08 | Discharge: 2021-08-08 | Disposition: A | Discharge status: Home / Self Care | Payer: PPO | Source: Ambulatory Visit | Attending: Family Medicine | Admitting: Family Medicine

## 2021-08-08 VITALS — BP 152/89 | HR 89 | Temp 98.2°F | Resp 20 | Wt 174.0 lb

## 2021-08-08 DIAGNOSIS — M545 Low back pain, unspecified: Secondary | ICD-10-CM | POA: Diagnosis not present

## 2021-08-08 NOTE — Progress Notes (Signed)
Established patient visit   Patient: Charles Sellers.   DOB: 1932/10/05   87 y.o. Male  MRN: [id number] Visit Date: 08/08/2021  Today's healthcare provider: [name], PA-C   Chief Complaint  Patient presents with   Back Pain   Subjective  -------------------------------------------------------------------------------------------------------------------- Back Pain This is a new problem. Episode onset: 6 days ago. The problem occurs constantly. The problem has been gradually worsening since onset. The pain is present in the lumbar spine. The pain does not radiate. The pain is at a severity of 9/10. Pertinent negatives include no abdominal pain, chest pain or fever. He has tried chiropractic manipulation and ice for the symptoms. The treatment provided no relief.   Patient denies any recent injuries or heavy lifting, but admits to picking tomatoes in his garden a few days before the back pain started.   Past Medical History:  Diagnosis Date   Atherosclerosis    CAD (coronary artery disease) 2011 & 2009   Echo, EF =>55% 2011/ Echo, EF =>55% 2009   CAD S/P percutaneous coronary angioplasty 12/13 & 15 2005   PCI - mRCA (Cypher DES 3.5 mm x 18 mm --> 4.85m); RPL Cypher 2.5 mm x 13 mm;; mLAD Cy a pher DES 3.0 mm x 13 mm (3.5 mm) with Cutting PTCA of D2 ostium;; mid Cx 100% after small OM1.   Cholecystitis    Dermatitis    Diabetes mellitus type II, controlled ([place]    Diet controlled. No medications; he indicates "borderline diabetes"   Dyslipidemia, goal LDL below 70     on statin   Hypertension    Leaky heart valve    Prostate nodule    Ventral hernia    Past Surgical History:  Procedure Laterality Date   APPENDECTOMY     CARDIAC CATHETERIZATION  2005   Carotid Dopplers  September 2014   Bilateral internal carotids < 49%.   CORONARY ANGIOPLASTY WITH STENT PLACEMENT     mRCA Cypher DES 3.5 mm x 18 mm (4.0), RPL Cypher DES 2.5 mm x 13 mm; mLAD 3.0 mm x 13 mm Cypher  -> cutting PTCA of jailed D2; previousl PCI of Cx - 100% mid Cx.   NM MYOVIEW LTD  06/2012   Small area of basal inferior infarct with no ischemia. Normal EF   Stress Myoview  2013   normal findings   TRANSTHORACIC ECHOCARDIOGRAM  February 2014    EF 55%. Mild-moderate MR   Social History   Tobacco Use   Smoking status: Never   Smokeless tobacco: Never  Vaping Use   Vaping Use: Never used  Substance Use Topics   Alcohol use: No    Alcohol/week: 0.0 standard drinks   Drug use: No   Family Status  Relation Name Status   Mother  Deceased at age 85  Father  Deceased at age 85  Brother  Deceased   Daughter  Alive   Daughter  Alive   Son 1 Alive   Son 2 Alive   Allergies  Allergen Reactions   Niaspan [Niacin Er] Rash   Penicillins Rash      Medications: Outpatient Medications Prior to Visit  Medication Sig   apixaban (ELIQUIS) 5 MG TABS tablet Take 1 tablet (5 mg total) by mouth 2 (two) times daily.   APPLE CIDER VINEGAR PO Take by mouth daily. (Patient not taking: No sig reported)   cholecalciferol (VITAMIN D) 1000 UNITS tablet Take 1,000 Units by mouth  daily. (Patient not taking: No sig reported)   glucose blood (ONETOUCH ULTRA) test strip USE TO TEST BLOOD SUGAR EVERY DAY AS DIRECTED   LIFESCAN FINEPOINT LANCETS MISC Test fasting blood sugar once each morning.   metFORMIN (GLUCOPHAGE) 500 MG tablet TAKE 1 TABLET twice daily   metoprolol succinate (TOPROL-XL) 25 MG 24 hr tablet Take 1 tablet (25 mg total) by mouth daily.   Multiple Vitamin (MULTIVITAMIN) tablet Take 1 tablet by mouth daily.   Multiple Vitamins-Minerals (ICAPS AREDS 2 PO) Take 1 capsule by mouth daily.   naproxen sodium (ALEVE) 220 MG tablet Take 220 mg by mouth.   naproxen sodium (ANAPROX) 220 MG tablet Take 220 mg by mouth daily as needed.   nitroGLYCERIN (NITROSTAT) 0.4 MG SL tablet Place 1 tablet (0.4 mg total) under the tongue every 5 (five) minutes as needed for chest pain.   Omega-3 Fatty Acids  (FISH OIL) 1000 MG CAPS Take 1,000 mg by mouth daily.   pantoprazole (PROTONIX) 40 MG tablet TAKE 1 TABLET BY MOUTH ON MONDAY, WEDNESDAY, FRIDAY OF EACH WEEK   ramipril (ALTACE) 5 MG capsule Take 1 capsule (5 mg total) by mouth daily.   rosuvastatin (CRESTOR) 10 MG tablet Take 1 tablet (10 mg total) by mouth daily.   No facility-administered medications prior to visit.    Review of Systems  Constitutional:  Negative for appetite change, chills and fever.  Respiratory:  Negative for chest tightness, shortness of breath and wheezing.   Cardiovascular:  Negative for chest pain and palpitations.  Gastrointestinal:  Negative for abdominal pain, nausea and vomiting.  Musculoskeletal:  Positive for back pain.   Last CBC Lab Results  Component Value Date   WBC 7.2 06/19/2021   HGB 14.9 06/19/2021   HCT 44.3 06/19/2021   MCV 86 06/19/2021   MCH 28.9 06/19/2021   RDW 13.7 06/19/2021   PLT 167 [id number]   Last metabolic panel Lab Results  Component Value Date   GLUCOSE 123 (H) 06/19/2021   NA 141 06/19/2021   K 4.7 06/19/2021   CL 101 06/19/2021   CO2 22 06/19/2021   BUN 15 06/19/2021   CREATININE 0.72 (L) 06/19/2021   GFRNONAA 84 12/05/2020   GFRAA 97 12/05/2020   CALCIUM 8.8 06/19/2021   PROT 7.2 06/19/2021   ALBUMIN 4.4 06/19/2021   LABGLOB 2.8 06/19/2021   AGRATIO 1.6 06/19/2021   BILITOT 1.2 06/19/2021   ALKPHOS 85 06/19/2021   AST 21 06/19/2021   ALT 16 06/19/2021   Last lipids Lab Results  Component Value Date   CHOL 122 06/19/2021   HDL 36 (L) 06/19/2021   LDLCALC 74 06/19/2021   TRIG 52 06/19/2021   CHOLHDL 3.4 06/19/2021   Last hemoglobin A1c Lab Results  Component Value Date   HGBA1C 8.0 (H) 12/05/2020       Objective  -------------------------------------------------------------------------------------------------------------------- BP (!) 152/89 (BP Location: Left Arm, Patient Position: Sitting, Cuff Size: Normal)   Pulse 89   Temp 98.2 F (36.8  C) (Temporal)   Resp 20   Wt 174 lb (78.9 kg)   SpO2 99% Comment: room air  BMI 27.25 kg/m  Physical Exam Constitutional:      General: He is not in acute distress.    Appearance: He is well-developed.  HENT:     Head: Normocephalic and atraumatic.     Right Ear: Hearing normal.     Left Ear: Hearing normal.     Nose: Nose normal.  Eyes:  General: Lids are normal. No scleral icterus.       Right eye: No discharge.        Left eye: No discharge.     Conjunctiva/sclera: Conjunctivae normal.  Pulmonary:     Effort: Pulmonary effort is normal. No respiratory distress.  Musculoskeletal:        General: Tenderness present.     Comments: Low back pain with flexion and some slight soreness to palpation in the lower back. DTR's symmetric without radiation of discomfort. No rashes.   Skin:    Findings: No lesion or rash.  Neurological:     Mental Status: He is alert and oriented to person, place, and time.  Psychiatric:        Speech: Speech normal.        Behavior: Behavior normal.        Thought Content: Thought content normal.      No results found for any visits on 08/08/21.  Assessment & Plan  ---------------------------------------------------------------------------------------------------------------------- 1. Acute midline low back pain without sciatica Developed low back pain after picking tomatoes 6 days ago. No radiation of pain into lower extremities or numbness. Suspect arthritis with muscular strain. May use Salonpas Lidocaine patches and moist heat applications. Add a little Aleve BID prn. Will get x-ray evaluation. - DG Lumbar Spine Complete   No follow-ups on file.      I, Kenzlee Fishburn, PA-C, have reviewed all documentation for this visit. The documentation on 08/08/21 for the exam, diagnosis, procedures, and orders are all accurate and complete.    [name], PA-C  [employer] [phone] (phone) [phone] (fax)  [place]

## 2021-08-09 ENCOUNTER — Telehealth: Payer: Self-pay | Admitting: Family Medicine

## 2021-08-09 NOTE — Telephone Encounter (Signed)
Patient called in to see if xray results are in, Please call back

## 2021-08-10 ENCOUNTER — Telehealth: Payer: Self-pay

## 2021-08-10 NOTE — Telephone Encounter (Signed)
Copied from [place] [id number]. Topic: General - Other >> Aug 10, 2021  3:07 PM Valere Dross wrote: Reason for CRM: Pts daughter called in on behalf of the pt for someone to give them a call when there results will be ready, they are wanting to know something before the weekend. Please advise

## 2021-08-11 ENCOUNTER — Encounter: Payer: Self-pay | Admitting: Family Medicine

## 2021-08-11 NOTE — Telephone Encounter (Signed)
Still waiting for xray results.

## 2021-08-15 ENCOUNTER — Telehealth: Payer: Self-pay

## 2021-08-15 NOTE — Telephone Encounter (Signed)
Copied from [place] [id number]. Topic: General - Other >> Aug 15, 2021  8:31 AM Celene Kras wrote: Reason for CRM: Pt called stating that he is needing his imaging results sent to [facility] and acupuncture. Please advise.

## 2021-08-31 ENCOUNTER — Other Ambulatory Visit: Payer: Self-pay

## 2021-08-31 ENCOUNTER — Other Ambulatory Visit: Payer: Self-pay | Admitting: Family Medicine

## 2021-08-31 ENCOUNTER — Encounter: Payer: Self-pay | Admitting: Podiatry

## 2021-08-31 ENCOUNTER — Ambulatory Visit: Payer: PPO | Admitting: Podiatry

## 2021-08-31 DIAGNOSIS — N181 Chronic kidney disease, stage 1: Secondary | ICD-10-CM

## 2021-08-31 DIAGNOSIS — M79674 Pain in right toe(s): Secondary | ICD-10-CM

## 2021-08-31 DIAGNOSIS — M79675 Pain in left toe(s): Secondary | ICD-10-CM

## 2021-08-31 DIAGNOSIS — D689 Coagulation defect, unspecified: Secondary | ICD-10-CM | POA: Diagnosis not present

## 2021-08-31 DIAGNOSIS — B351 Tinea unguium: Secondary | ICD-10-CM

## 2021-08-31 DIAGNOSIS — E1122 Type 2 diabetes mellitus with diabetic chronic kidney disease: Secondary | ICD-10-CM

## 2021-08-31 NOTE — Progress Notes (Signed)
This patient returns to my office for at risk foot care.  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  Patient is taking plavix.  This patient is unable to cut nails himself since the patient cannot reach his nails.These nails are painful walking and wearing shoes.  This patient presents for at risk foot care today.  General Appearance  Alert, conversant and in no acute stress.  Vascular  Dorsalis pedis and posterior tibial  pulses are palpable  bilaterally.  Capillary return is within normal limits  bilaterally. Temperature is within normal limits  bilaterally.  Neurologic  Senn-Weinstein monofilament wire test within normal limits  bilaterally. Muscle power within normal limits bilaterally.  Nails Thick disfigured discolored nails with subungual debris  from hallux to fifth toes bilaterally. No evidence of bacterial infection or drainage bilaterally.  Orthopedic  No limitations of motion  feet .  No crepitus or effusions noted.  No bony pathology or digital deformities noted.  Skin  normotropic skin with no porokeratosis noted bilaterally.  No signs of infections or ulcers noted.     Onychomycosis  Pain in right toes  Pain in left toes  Consent was obtained for treatment procedures.   Mechanical debridement of nails 1-5  bilaterally performed with a nail nipper.  Filed with dremel without incident.    Return office visit   3 months                   Told patient to return for periodic foot care and evaluation due to potential at risk complications.   Gardiner Barefoot DPM

## 2021-08-31 NOTE — Telephone Encounter (Signed)
  Notes to clinic Duplicate request, Glucophage 500 approved 08/31/21, please assess.

## 2021-09-01 ENCOUNTER — Encounter: Payer: Self-pay | Admitting: Family Medicine

## 2021-09-01 LAB — POCT GLYCOSYLATED HEMOGLOBIN (HGB A1C)
HbA1c POC (<> result, manual entry): 7.3 % (ref 4.0–5.6)
Hemoglobin A1C: 7.3 % — AB (ref 4.0–5.6)

## 2021-09-02 DIAGNOSIS — S40012A Contusion of left shoulder, initial encounter: Secondary | ICD-10-CM | POA: Diagnosis not present

## 2021-09-06 DIAGNOSIS — S40012A Contusion of left shoulder, initial encounter: Secondary | ICD-10-CM | POA: Diagnosis not present

## 2021-10-16 ENCOUNTER — Other Ambulatory Visit: Payer: Self-pay

## 2021-10-16 ENCOUNTER — Ambulatory Visit: Payer: PPO | Admitting: Cardiology

## 2021-10-16 ENCOUNTER — Other Ambulatory Visit: Payer: Self-pay | Admitting: Family Medicine

## 2021-10-16 ENCOUNTER — Encounter: Payer: Self-pay | Admitting: Cardiology

## 2021-10-16 VITALS — BP 134/78 | HR 87 | Ht 66.0 in | Wt 166.6 lb

## 2021-10-16 DIAGNOSIS — I340 Nonrheumatic mitral (valve) insufficiency: Secondary | ICD-10-CM

## 2021-10-16 DIAGNOSIS — I2510 Atherosclerotic heart disease of native coronary artery without angina pectoris: Secondary | ICD-10-CM | POA: Diagnosis not present

## 2021-10-16 DIAGNOSIS — Z9861 Coronary angioplasty status: Secondary | ICD-10-CM

## 2021-10-16 DIAGNOSIS — E1121 Type 2 diabetes mellitus with diabetic nephropathy: Secondary | ICD-10-CM

## 2021-10-16 DIAGNOSIS — E1169 Type 2 diabetes mellitus with other specified complication: Secondary | ICD-10-CM

## 2021-10-16 DIAGNOSIS — E785 Hyperlipidemia, unspecified: Secondary | ICD-10-CM | POA: Diagnosis not present

## 2021-10-16 DIAGNOSIS — I10 Essential (primary) hypertension: Secondary | ICD-10-CM

## 2021-10-16 DIAGNOSIS — I4821 Permanent atrial fibrillation: Secondary | ICD-10-CM | POA: Diagnosis not present

## 2021-10-16 DIAGNOSIS — I350 Nonrheumatic aortic (valve) stenosis: Secondary | ICD-10-CM | POA: Diagnosis not present

## 2021-10-16 MED ORDER — ONETOUCH ULTRA VI STRP: ORAL_STRIP | 2 refills | Status: DC

## 2021-10-16 NOTE — Progress Notes (Signed)
Primary Care Provider: Margo Common, PA-C (Inactive) Cardiologist: Glenetta Hew, MD Electrophysiologist: None  Clinic Note: Chief Complaint  Patient presents with   Follow-up    Annual   Coronary Artery Disease    Angina   Atrial Fibrillation    No feeling of irregular heartbeats.    ===================================  ASSESSMENT/PLAN   Problem List Items Addressed This Visit       Cardiology Problems   Permanent atrial fibrillation ([place]); CHA2DS2-VASc score 5 (Chronic)    Remains asymptomatic as far as A. fib goes.  No sensation of irregular heartbeats palpitations.  Rate well controlled with relative low-dose Toprol.  Is on appropriate dose of Eliquis.  No bleeding issues.      Relevant Orders   EKG 12-Lead (Completed)   ECHOCARDIOGRAM COMPLETE   Hyperlipidemia associated with type 2 diabetes mellitus ([place]) (Chronic)    Most recent labs indicate LDL of 74 back in July.  Given his advanced age, I think we are okay with this simply continuing current dose of rosuvastatin.  If he goes up, would probably want to increase to 20 mg.      Essential hypertension (Chronic)    Blood pressure stable on current dose of ramipril and Toprol .      CAD S/P percutaneous coronary angioplasty - Primary (Chronic)    2 stents in the RCA 1 in the LAD.  Known occlusion of the mid LCx.  No further angina.  Almost 18 years out from PCI.  Last Myoview was in 2013.  No ischemia.  Switched from maintenance dose Plavix to DOAC because of diagnosis of A. Fib.  Plan: Continue low-dose Toprol, rare pleural and rosuvastatin      Mitral regurgitation (Chronic)    Most recent echo showed moderate MR, and I did hear a murmur on exam..  I would like to get at least 1 more echo next year to make sure it is not progressing.  He seems to be relatively asymptomatic.  It could be that atrial fibrillation may be MR worse.  If stable next year, would probably only check echo if symptoms  worsen.      Mild aortic stenosis (Chronic)    Most recent echo did not show true aortic stenosis, read as only aortic sclerosis.      Relevant Orders   EKG 12-Lead (Completed)   ECHOCARDIOGRAM COMPLETE    ===================================  HPI:    Charles Sellers. is a 87 y.o. male with a PMH notable for CAD-PCI, PAF (diagnosed at last visit), DM-2, HLD, HTN, Mitral Regurgitation and Aortic Stenosis who presents today for annual follow-up.  Nora is a former patient of Dr. Terance Ice with CAD and moderate mitral regurgitation. December 2005: Staged two-vessel DES PCI --> initial PCI of the mid RCA with a 3.5 x 18 mm Cypher DES (-> 4.0 mm),  RPL - Cypher DES 2.5 mm x 13 mm  Staged PCI of the LAD at D2 with a Cypher DES 3.0 mm 30 mm (-> 3.68mm) w/ provisional PTCA of D2 ostium 2/2 plaque shift.  CTO dCx ==> Med Rx.  Follow-up Myoviews (most recent June 2013) --> nonischemic.  Small basal inferior infarct. TTE 08/07/2018: Normal LV function EF 55 to 60%.  No R WMA.  GR 1 DD.  Mild aortic stenosis with trivial regurgitation.  Moderate mitral regurgitation with mildly dilated left atrium.   Charles Sellers. was last seen on 10/12/2020 -no major complaints.  Remains very active,  enjoys cutting wood with a chainsaw, doing yard work and walking daily.  Does have some balance issues.  Has had a couple falls where he tripped.  No sensation of irregular heartbeats.  No angina or heart failure.  Sleeps on a wedge pillow for comfort.  Occasional palpitations, but no sensation of being in A. fib that was noted on EKG. -> 2D echo ordered.  Started on Eliquis, and discontinue Plavix.Marland Kitchen  Recent Hospitalizations: None  Reviewed  CV studies:    The following studies were reviewed today: (if available, images/films reviewed: From Epic Chart or Care Everywhere) TTE 11/09/2020: Normal EF 55 to 60%.  Moderate MR with moderate LA dilation.  Mild aortic sclerosis with no stenosis.  Normal RV, RVP and  RAP.  Interval History:   Charles Sellers. returns here today overall doing fairly well.  He is a little bit down because his wife passed away on 05/13/23 of this year.  He is slowly learning how to live alone, but does have family close by. From health standpoint he had COVID in January but did not get very sick.  He was surprised at how quickly recovered.  He denies any sensation of irregular heartbeats or palpitations.  No chest pain or pressure with rest or exertion.  He sleeps on a wedge pillow for comfort, but has no PND, orthopnea or significant edema.  He does note that his legs get a little weak when he is more active.  He indicates it is more difficult for him to get up from a seated position or lying down.  Is not necessarily exertional like discomfort..  CV Review of Symptoms (Summary) Cardiovascular ROS: no chest pain or dyspnea on exertion positive for - - Becoming more deconditioned.  Leg weakness.  Not as active. negative for - edema, irregular heartbeat, orthopnea, palpitations, paroxysmal nocturnal dyspnea, rapid heart rate, shortness of breath, or -Lightheadedness, dizziness or wooziness, syncope/near syncope or TIA/amaurosis fugax, claudication  REVIEWED OF SYSTEMS   Review of Systems  Constitutional:  Positive for weight loss (Not eating and drinking as well as he had been.  A lot of this may be because of depression after his wife died.).  HENT:  Negative for nosebleeds.   Respiratory:  Negative for cough and shortness of breath.   Cardiovascular:  Negative for leg swelling.  Gastrointestinal:  Negative for blood in stool and melena.       Occasional constipation  Genitourinary:  Positive for frequency (Frequent nocturia). Negative for hematuria.  Musculoskeletal:  Positive for falls (Loss of balance.) and joint pain. Negative for myalgias.  Neurological:  Positive for dizziness and weakness (Leg weakness.). Negative for headaches.  Psychiatric/Behavioral:  Positive  for memory loss. Negative for depression. The patient does not have insomnia (Just has difficulty falling back asleep after getting up to urinate.).    I have reviewed and (if needed) personally updated the patient's problem list, medications, allergies, past medical and surgical history, social and family history.   PAST MEDICAL HISTORY   Past Medical History:  Diagnosis Date   Atherosclerosis    CAD (coronary artery disease) 2011 & 2009   Echo, EF =>55% 2011/ Echo, EF =>55% 2009   CAD S/P percutaneous coronary angioplasty 12/13 & 15 2005   PCI - mRCA (Cypher DES 3.5 mm x 18 mm --> 4.13mm); RPL Cypher 2.5 mm x 13 mm;; mLAD Cy a pher DES 3.0 mm x 13 mm (3.5 mm) with Cutting PTCA of D2 ostium;; mid  Cx 100% after small OM1.   Cholecystitis    Dermatitis    Diabetes mellitus type II, controlled ([place])    Diet controlled. No medications; he indicates "borderline diabetes"   Dyslipidemia, goal LDL below 70     on statin   Hypertension    Leaky heart valve    Prostate nodule    Ventral hernia     PAST SURGICAL HISTORY   Past Surgical History:  Procedure Laterality Date   APPENDECTOMY     CARDIAC CATHETERIZATION  12/11/2003   Carotid Dopplers  08/10/2013   Bilateral internal carotids < 49%.   CORONARY ANGIOPLASTY WITH STENT PLACEMENT     mRCA Cypher DES 3.5 mm x 18 mm (4.0), RPL Cypher DES 2.5 mm x 13 mm; mLAD 3.0 mm x 13 mm Cypher -> cutting PTCA of jailed D2; previousl PCI of Cx - 100% mid Cx.   NM MYOVIEW LTD  06/09/2012   Small area of basal inferior infarct with no ischemia. Normal EF   Stress Myoview  12/11/2011   normal findings   TRANSTHORACIC ECHOCARDIOGRAM  01/10/2013    EF 55%. Mild-moderate MR   TRANSTHORACIC ECHOCARDIOGRAM  11/09/2020   Normal EF 55 to 60%.  Moderate MR with moderate LA dilation.  Mild aortic sclerosis with no stenosis.  Normal RV, RVP and RAP.    Immunization History  Administered Date(s) Administered   Fluad Quad(high Dose 65+) 10/20/2019,  09/01/2020   Influenza, High Dose Seasonal PF 10/18/2017, 10/24/2018   Pneumococcal Conjugate-13 01/09/2016   Pneumococcal Polysaccharide-23 08/04/2008   Tdap 01/09/2016   Zoster Recombinat (Shingrix) 11/04/2019, 01/14/2020    MEDICATIONS/ALLERGIES   Current Meds  Medication Sig   APPLE CIDER VINEGAR PO Take by mouth daily.   cholecalciferol (VITAMIN D) 1000 UNITS tablet Take 1,000 Units by mouth daily.   LIFESCAN FINEPOINT LANCETS MISC Test fasting blood sugar once each morning.   metFORMIN (GLUCOPHAGE) 500 MG tablet TAKE 1 TABLET BY MOUTH TWICE DAILY   metoprolol succinate (TOPROL-XL) 25 MG 24 hr tablet Take 1 tablet (25 mg total) by mouth daily.   Multiple Vitamins-Minerals (ICAPS AREDS 2 PO) Take 1 capsule by mouth daily.   naproxen sodium (ANAPROX) 220 MG tablet Take 220 mg by mouth daily as needed.   nitroGLYCERIN (NITROSTAT) 0.4 MG SL tablet Place 1 tablet (0.4 mg total) under the tongue every 5 (five) minutes as needed for chest pain.   Omega-3 Fatty Acids (FISH OIL) 1000 MG CAPS Take 1,000 mg by mouth daily.   pantoprazole (PROTONIX) 40 MG tablet TAKE 1 TABLET BY MOUTH ON MONDAY, WEDNESDAY, FRIDAY OF EACH WEEK   ramipril (ALTACE) 5 MG capsule Take 1 capsule (5 mg total) by mouth daily.   rosuvastatin (CRESTOR) 10 MG tablet Take 1 tablet (10 mg total) by mouth daily.   [DISCONTINUED] glucose blood (ONETOUCH ULTRA) test strip USE TO TEST BLOOD SUGAR EVERY DAY AS DIRECTED    Allergies  Allergen Reactions   Niaspan [Niacin Er] Rash   Penicillins Rash    SOCIAL HISTORY/FAMILY HISTORY   Reviewed in Epic:  Pertinent findings:  Social History   Tobacco Use   Smoking status: Never   Smokeless tobacco: Never  Vaping Use   Vaping Use: Never used  Substance Use Topics   Alcohol use: No    Alcohol/week: 0.0 standard drinks   Drug use: No   Social History   Social History Narrative   Widowed father of 3, 9 grandchildren, now 53 great-grandchildren. 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   Using the toilet, but does not do routine exercise. Always on the go.   Never smoked. Does not drink alcohol.    OBJCTIVE -PE, EKG, labs   Wt Readings from Last 3 Encounters:  10/16/21 166 lb 9.6 oz (75.6 kg)  08/08/21 174 lb (78.9 kg)  12/05/20 181 lb (82.1 kg)    Physical Exam: BP 134/78   Pulse 87   Ht 5\' 6"  (1.676 m)   Wt 166 lb 9.6 oz (75.6 kg)   SpO2 95%   BMI 26.89 kg/m  Physical Exam Vitals reviewed.  Constitutional:      General: He is not in acute distress.    Appearance: Normal appearance. He is normal weight. He is not ill-appearing.  HENT:     Head: Normocephalic and atraumatic.  Neck:     Vascular: No carotid bruit or JVD.  Cardiovascular:     Rate and Rhythm: Normal rate. Rhythm irregularly irregular. No extrasystoles are present.    Chest Wall: PMI is not displaced.     Pulses: Normal pulses.     Heart sounds: Heart sounds are distant. Murmur heard.  Harsh crescendo-decrescendo early systolic murmur is present with a grade of 1/6 at the upper right sternal border radiating to the neck.  High-pitched blowing holosystolic murmur of grade 1/6 is also present at the apex.    No friction rub. No gallop.     Comments: Normal S1 but likely split S2. Pulmonary:     Effort: Pulmonary effort is normal. No respiratory distress.     Breath sounds: Normal breath sounds.  Chest:     Chest wall: No tenderness.  Musculoskeletal:        General: No swelling. Normal range of motion.     Cervical back: Normal range of motion.  Skin:    General: Skin is warm and dry.  Neurological:     General: No focal deficit present.     Mental Status: He is alert and oriented to person, place, and time.     Gait: Gait abnormal (Slow, wide-based gait).  Psychiatric:        Mood and Affect: Mood normal.        Behavior: Behavior  normal.        Thought Content: Thought content normal.        Judgment: Judgment normal.    Adult ECG Report  Rate: 87;  Rhythm: atrial fibrillation and RBBB, mild LVH.  Inferior MI, age-indeterminate. ;   Narrative Interpretation: Stable  Recent Labs:  Reviewed  Lab Results  Component Value Date   CHOL 122 06/19/2021   HDL 36 (L) 06/19/2021   LDLCALC 74 06/19/2021   TRIG 52 06/19/2021   CHOLHDL 3.4 06/19/2021   Lab Results  Component Value Date   CREATININE 0.72 (L) 06/19/2021   BUN 15 06/19/2021   NA 141 06/19/2021   K 4.7 06/19/2021   CL 101 06/19/2021   CO2 22 06/19/2021   CBC Latest Ref Rng & Units 06/19/2021 12/05/2020 09/01/2020  WBC 3.4 - 10.8 x10E3/uL 7.2 7.7 6.3  Hemoglobin 13.0 - 17.7 g/dL 14.9 15.5 15.0  Hematocrit 37.5 - 51.0 % 44.3 45.5 43.8  Platelets 150 - 450 x10E3/uL 167 169 157    Lab Results  Component Value Date   HGBA1C 7.3 (A)  09/01/2021   HGBA1C 7.3 09/01/2021   Lab Results  Component Value Date   TSH 1.450 06/19/2021    ==================================================  COVID-19 Education: The signs and symptoms of COVID-19 were discussed with the patient and how to seek care for testing (follow up with PCP or arrange E-visit).    I spent a total of 21 minutes with the patient spent in direct patient consultation.  Additional time spent with chart review  / charting (studies, outside notes, etc): 16 min Total Time: 37 min  Current medicines are reviewed at length with the patient today.  (+/- concerns) none  This visit occurred during the SARS-CoV-2 public health emergency.  Safety protocols were in place, including screening questions prior to the visit, additional usage of staff PPE, and extensive cleaning of exam room while observing appropriate contact time as indicated for disinfecting solutions.  Notice: This dictation was prepared with Dragon dictation along with smart phrase technology. Any transcriptional errors that result  from this process are unintentional and may not be corrected upon review.  Patient Instructions / Medication Changes & Studies & Tests Ordered   Patient Instructions  Medication Instructions:   No changes *If you need a refill on your cardiac medications before your next appointment, please call your pharmacy*   Lab Work:   Not needed  Testing/Procedures:   [address]- Oct 2023 Your physician has requested that you have an echocardiogram. Echocardiography is a painless test that uses sound waves to create images of your heart. It provides your doctor with information about the size and shape of your heart and how well your heart's chambers and valves are working. This procedure takes approximately one hour. There are no restrictions for this procedure.   Follow-Up: At [facility], you and your health needs are our priority.  As part of our continuing mission to provide you with exceptional heart care, we have created designated Provider Care Teams.  These Care Teams include your primary Cardiologist (physician) and Advanced Practice Providers (APPs -  Physician Assistants and Nurse Practitioners) who all work together to provide you with the care you need, when you need it.     Your next appointment:   12 month(s)  The format for your next appointment:   In Person  Provider:   Glenetta Hew, MD      Studies Ordered:   Orders Placed This Encounter  Procedures   EKG 12-Lead   ECHOCARDIOGRAM COMPLETE      Glenetta Hew, M.D., M.S. Interventional Cardiologist   Pager # [phone] Phone # [phone] [address] [place],  [address]   Thank you for choosing [facility] at [facility]!!

## 2021-10-16 NOTE — Telephone Encounter (Signed)
Medication Refill - Medication:  glucose blood (ONETOUCH ULTRA) test strip  Has the patient contacted their pharmacy? Yes.   Contact PCP  Preferred Pharmacy (with phone number or street name):  [id number] [address], [address]  [place], [place] [address]  Phone:  [phone]  Fax:  [phone]   Has the patient been seen for an appointment in the last year OR does the patient have an upcoming appointment? Yes.    Agent: Please be advised that RX refills may take up to 3 business days. We ask that you follow-up with your pharmacy.

## 2021-10-16 NOTE — Telephone Encounter (Signed)
Requested Prescriptions  Pending Prescriptions Disp Refills  . glucose blood (ONETOUCH ULTRA) test strip 100 strip 2    Sig: USE TO TEST BLOOD SUGAR EVERY DAY AS DIRECTED     Endocrinology: Diabetes - Testing Supplies Passed - 10/16/2021 11:55 AM      Passed - Valid encounter within last 12 months    Recent Outpatient Visits          2 months ago Acute midline low back pain without sciatica   [place], PA-C   3 months ago Controlled type 2 diabetes mellitus with stage 1 chronic kidney disease, without long-term current use of insulin ([place])   [employer], Vickki Muff, PA-C   6 months ago Cough   [employer] [facility], Laurita Quint, FNP   10 months ago Hyperlipidemia associated with type 2 diabetes mellitus [facility])   [place], Vickki Muff, PA-C   1 year ago Controlled type 2 diabetes mellitus with stage 1 chronic kidney disease, without long-term current use of insulin [facility])   [place], Vickki Muff, [place]

## 2021-10-16 NOTE — Patient Instructions (Signed)
Medication Instructions:   No changes *If you need a refill on your cardiac medications before your next appointment, please call your pharmacy*   Lab Work:   Not needed  Testing/Procedures:   [address]- Oct 2023 Your physician has requested that you have an echocardiogram. Echocardiography is a painless test that uses sound waves to create images of your heart. It provides your doctor with information about the size and shape of your heart and how well your heart's chambers and valves are working. This procedure takes approximately one hour. There are no restrictions for this procedure.   Follow-Up: At [facility] you and your health needs are our priority.  As part of our continuing mission to provide you with exceptional heart care, we have created designated Provider Care Teams.  These Care Teams include your primary Cardiologist (physician) and Advanced Practice Providers (APPs -  Physician Assistants and Nurse Practitioners) who all work together to provide you with the care you need, when you need it.     Your next appointment:   12 month(s)  The format for your next appointment:   In Person  Provider:   Glenetta Hew, MD

## 2021-11-01 ENCOUNTER — Other Ambulatory Visit: Payer: Self-pay | Admitting: Cardiology

## 2021-11-01 MED ORDER — APIXABAN 5 MG PO TABS: 5.0000 mg | ORAL_TABLET | Freq: Two times a day (BID) | ORAL | 1 refills | Status: DC

## 2021-11-01 NOTE — Telephone Encounter (Signed)
Prescription refill request for Eliquis received. Indication:Afib Last office visit:11/22 Scr:0.7 Age: 87 Weight:75.6 kg  Prescription refilled

## 2021-11-05 ENCOUNTER — Encounter: Payer: Self-pay | Admitting: Cardiology

## 2021-11-05 NOTE — Assessment & Plan Note (Signed)
Most recent echo showed moderate MR, and I did hear a murmur on exam..  I would like to get at least 1 more echo next year to make sure it is not progressing.  He seems to be relatively asymptomatic.  It could be that atrial fibrillation may be MR worse.  If stable next year, would probably only check echo if symptoms worsen.

## 2021-11-05 NOTE — Assessment & Plan Note (Addendum)
Remains asymptomatic as far as A. fib goes.  No sensation of irregular heartbeats palpitations.  Rate well controlled with relative low-dose Toprol.  Is on appropriate dose of Eliquis.  No bleeding issues.

## 2021-11-05 NOTE — Assessment & Plan Note (Signed)
Most recent echo did not show true aortic stenosis, read as only aortic sclerosis.

## 2021-11-05 NOTE — Assessment & Plan Note (Signed)
Most recent labs indicate LDL of 74 back in July.  Given his advanced age, I think we are okay with this simply continuing current dose of rosuvastatin.  If he goes up, would probably want to increase to 20 mg.

## 2021-11-05 NOTE — Assessment & Plan Note (Signed)
2 stents in the RCA 1 in the LAD.  Known occlusion of the mid LCx.  No further angina.  Almost 18 years out from PCI.  Last Myoview was in 2013.  No ischemia.  Switched from maintenance dose Plavix to DOAC because of diagnosis of A. Fib.  Plan: Continue low-dose Toprol, rare pleural and rosuvastatin

## 2021-11-05 NOTE — Assessment & Plan Note (Addendum)
Blood pressure stable on current dose of ramipril and Toprol .

## 2021-11-07 ENCOUNTER — Encounter: Payer: Self-pay | Admitting: Family Medicine

## 2021-11-07 ENCOUNTER — Other Ambulatory Visit: Payer: Self-pay

## 2021-11-07 ENCOUNTER — Ambulatory Visit (INDEPENDENT_AMBULATORY_CARE_PROVIDER_SITE_OTHER): Payer: PPO | Admitting: Family Medicine

## 2021-11-07 VITALS — BP 138/84 | HR 99 | Temp 97.5°F

## 2021-11-07 DIAGNOSIS — E785 Hyperlipidemia, unspecified: Secondary | ICD-10-CM

## 2021-11-07 DIAGNOSIS — E1169 Type 2 diabetes mellitus with other specified complication: Secondary | ICD-10-CM | POA: Diagnosis not present

## 2021-11-07 DIAGNOSIS — I152 Hypertension secondary to endocrine disorders: Secondary | ICD-10-CM

## 2021-11-07 DIAGNOSIS — J069 Acute upper respiratory infection, unspecified: Secondary | ICD-10-CM | POA: Diagnosis not present

## 2021-11-07 DIAGNOSIS — E1159 Type 2 diabetes mellitus with other circulatory complications: Secondary | ICD-10-CM

## 2021-11-07 NOTE — Progress Notes (Signed)
Established patient visit   Patient: Charles Sellers.   DOB: 04-16-1932   87 y.o. Male  MRN: [id number] Visit Date: 11/07/2021  Today's healthcare provider: Lavon Paganini, MD   Chief Complaint  Patient presents with   Diabetes    He reports checking blood sugar this morning reading was in the low 100's. He does not remember previous readings. He reports waking up this morning feeling weak, head ache and felt like he had a fever.    Subjective     HPI     Diabetes    Additional comments: He reports checking blood sugar this morning reading was in the low 100's. He does not remember previous readings. He reports waking up this morning feeling weak, head ache and felt like he had a fever.       Last edited by Dorian Pod, CMA on 11/07/2021  9:59 AM.      URI - Woke up this morning with runny nose, congestion, sore throat, cough, fever - No known sick contacts - Cough is productive with thick yellow sputum - Reports similar symptoms yearly "before Christmas and in March"  Diabetes - blood sugar this morning was 114, usual range is 110-150 - diet control is difficult after his wife's passing, does not cook and eats out  Medications: Outpatient Medications Prior to Visit  Medication Sig   apixaban (ELIQUIS) 5 MG TABS tablet Take 1 tablet (5 mg total) by mouth 2 (two) times daily.   cholecalciferol (VITAMIN D) 1000 UNITS tablet Take 1,000 Units by mouth daily.   glucose blood (ONETOUCH ULTRA) test strip USE TO TEST BLOOD SUGAR EVERY DAY AS DIRECTED   LIFESCAN FINEPOINT LANCETS MISC Test fasting blood sugar once each morning.   metFORMIN (GLUCOPHAGE) 500 MG tablet TAKE 1 TABLET BY MOUTH TWICE DAILY   metoprolol succinate (TOPROL-XL) 25 MG 24 hr tablet Take 1 tablet (25 mg total) by mouth daily.   Multiple Vitamins-Minerals (ICAPS AREDS 2 PO) Take 1 capsule by mouth daily.   naproxen sodium (ANAPROX) 220 MG tablet Take 220 mg by mouth daily as needed.    nitroGLYCERIN (NITROSTAT) 0.4 MG SL tablet Place 1 tablet (0.4 mg total) under the tongue every 5 (five) minutes as needed for chest pain.   Omega-3 Fatty Acids (FISH OIL) 1000 MG CAPS Take 1,000 mg by mouth daily.   pantoprazole (PROTONIX) 40 MG tablet TAKE 1 TABLET BY MOUTH ON MONDAY, WEDNESDAY, FRIDAY OF EACH WEEK   ramipril (ALTACE) 5 MG capsule Take 1 capsule (5 mg total) by mouth daily.   rosuvastatin (CRESTOR) 10 MG tablet Take 1 tablet (10 mg total) by mouth daily.   APPLE CIDER VINEGAR PO Take by mouth daily. (Patient not taking: Reported on 11/07/2021)   No facility-administered medications prior to visit.    Review of Systems  Constitutional:  Positive for fatigue.  HENT:  Positive for congestion, postnasal drip, rhinorrhea and sore throat.   Respiratory:  Positive for cough. Negative for chest tightness and shortness of breath.   Cardiovascular: Negative.   Gastrointestinal: Negative.       Objective    BP 138/84 (BP Location: Left Arm, Patient Position: Sitting, Cuff Size: Large)   Pulse 99   Temp (!) 97.5 F (36.4 C) (Oral)   SpO2 100%  {Show previous vital signs (optional):23777}  Physical Exam Vitals reviewed.  Constitutional:      General: He is not in acute distress.    Appearance: Normal  appearance. He is not ill-appearing or toxic-appearing.  HENT:     Head: Normocephalic and atraumatic.     Right Ear: External ear normal.     Left Ear: External ear normal.     Nose: Nose normal.     Mouth/Throat:     Mouth: Mucous membranes are moist.     Pharynx: Oropharynx is clear. No posterior oropharyngeal erythema.  Eyes:     General: No scleral icterus.    Extraocular Movements: Extraocular movements intact.     Conjunctiva/sclera: Conjunctivae normal.     Pupils: Pupils are equal, round, and reactive to light.  Cardiovascular:     Rate and Rhythm: Normal rate and regular rhythm.     Pulses: Normal pulses.     Heart sounds: Normal heart sounds. No murmur  heard.   No friction rub. No gallop.  Pulmonary:     Effort: Pulmonary effort is normal. No respiratory distress.     Breath sounds: Normal breath sounds. No wheezing, rhonchi or rales.  Abdominal:     General: Abdomen is flat. There is no distension.     Palpations: Abdomen is soft.     Tenderness: There is no abdominal tenderness.  Musculoskeletal:        General: Normal range of motion.     Cervical back: Normal range of motion and neck supple.  Skin:    General: Skin is warm and dry.     Findings: No lesion or rash.  Neurological:     General: No focal deficit present.     Mental Status: He is alert and oriented to person, place, and time.      No results found for any visits on 11/07/21.  Assessment & Plan     Problem List Items Addressed This Visit       Cardiovascular and Mediastinum   Hypertension associated with diabetes ([place])    Chronic, well controlled Continue meds      Relevant Orders   AMB Referral to Community Care Coordinaton     Respiratory   Viral URI with cough    Symptoms most consistent with viral URI Covid/Flu/RSV swab to determine possible treatments      Relevant Orders   COVID-19, Flu A+B and RSV     Endocrine   Hyperlipidemia associated with type 2 diabetes mellitus ([place]) (Chronic)    Previously well controlled Continue rosuvastatin 10mg       Relevant Orders   AMB Referral to Community Care Coordinaton   Diabetes mellitus, type 2 ([place]) - Primary    Previously moderately controlled, A1c 7.3 slightly above goal, but appropriate for age Continue metformin 500mg  On ACEi Eye exam scheduled in December  Referral to community care coordination Check A1c and foot exam at next visit      Relevant Orders   AMB Referral to [place]     Return in about 2 months (around 01/07/2022) for chronic disease f/u, With new PCP.      Nelva Nay, Medical Student 11/08/2021, 11:31 AM   Patient seen along with MS3 student  Nelva Nay. I personally evaluated this patient along with the student, and verified all aspects of the history, physical exam, and medical decision making as documented by the student. I agree with the student's documentation and have made all necessary edits.  Levonne Carreras, Dionne Bucy, MD, MPH [facility]

## 2021-11-07 NOTE — Assessment & Plan Note (Addendum)
Previously moderately controlled, A1c 7.3 slightly above goal, but appropriate for age Continue metformin 500mg  On ACEi Eye exam scheduled in December  Referral to community care coordination Check A1c and foot exam at next visit

## 2021-11-07 NOTE — Assessment & Plan Note (Signed)
Chronic, well controlled Continue meds

## 2021-11-08 ENCOUNTER — Telehealth: Payer: Self-pay | Admitting: *Deleted

## 2021-11-08 LAB — COVID-19, FLU A+B AND RSV
Influenza A, NAA: NOT DETECTED
Influenza B, NAA: NOT DETECTED
RSV, NAA: NOT DETECTED
SARS-CoV-2, NAA: DETECTED — AB

## 2021-11-08 NOTE — Chronic Care Management (AMB) (Signed)
  Chronic Care Management   Outreach Note  11/08/2021 Name: Charles Sellers. MRN: [id number] DOB: 28-Jun-1932  Charles Sellers. is a 87 y.o. year old male who is a primary care patient of [name], [place]. I reached out to Charles Sellers. by phone today in response to a referral sent by Charles Sellers primary care provider.  An unsuccessful telephone outreach was attempted today. The patient was referred to the case management team for assistance with care management and care coordination.   Follow Up Plan: A HIPAA compliant phone message was left for the patient providing contact information and requesting a return call.  If patient returns call to provider office, please advise to call Embedded Care Management Care Guide Taegan Haider at [place], [place] Management  Direct Dial: [phone]

## 2021-11-08 NOTE — Assessment & Plan Note (Signed)
Symptoms most consistent with viral URI Covid/Flu/RSV swab to determine possible treatments

## 2021-11-08 NOTE — Assessment & Plan Note (Signed)
Previously well controlled Continue rosuvastatin 10mg 

## 2021-11-09 ENCOUNTER — Other Ambulatory Visit: Payer: Self-pay

## 2021-11-09 ENCOUNTER — Telehealth: Payer: Self-pay

## 2021-11-09 ENCOUNTER — Ambulatory Visit: Payer: Self-pay

## 2021-11-09 ENCOUNTER — Ambulatory Visit: Payer: PPO | Admitting: Podiatry

## 2021-11-09 MED ORDER — MOLNUPIRAVIR EUA 200MG CAPSULE: 4.0000 | ORAL_CAPSULE | Freq: Two times a day (BID) | ORAL | 0 refills | Status: AC

## 2021-11-09 NOTE — Telephone Encounter (Signed)
Copied from [place] [id number]. Topic: General - Other >> Nov 09, 2021  9:37 AM Yvette Rack wrote: Reason for CRM: Pt daughter [name] requests that a Rx for a z pack be sent to [facility], [address] [name] also requests call back. Cb# [phone]

## 2021-11-09 NOTE — Telephone Encounter (Signed)
Error

## 2021-11-09 NOTE — Telephone Encounter (Signed)
Please review office note 11/07/2021.  Pt was seen for chronic issues as well as viral URI   Thanks,   -Mickel Baas

## 2021-11-09 NOTE — Telephone Encounter (Signed)
FYI

## 2021-11-09 NOTE — Telephone Encounter (Signed)
Pt.'s daughter, Lynita Lombard to check on prescription for her father. Instructed a prescription was sent to [facility]. States he "does not use that pharmacy anymore.I'm so tired of the lack of communication with this office. It happens all the time." I apologized to daughter. She will go to [facility] to pick up prescription.

## 2021-11-09 NOTE — Telephone Encounter (Signed)
Pt's daughter called saying now that they know that he has Covid.  She is very concerned about her dad.  Chills, cough and fever and uri.  She would like a medication for Covid.  The [facility] told her not to take the paxovid but the other medication .  She said pharmacist at [facility] told her that she talked to Dr. B this morning.  CB#  [phone]

## 2021-11-10 NOTE — Telephone Encounter (Signed)
Noted  

## 2021-11-13 NOTE — Chronic Care Management (AMB) (Signed)
  Chronic Care Management   Outreach Note  11/13/2021 Name: Charles Sellers. MRN: [id number] DOB: December 09, 1932  Charles Sellers. is a 87 y.o. year old male who is a primary care patient of [name], [place]. I reached out to Charles Sellers. by phone today in response to a referral sent by [name]'s primary care provider.  A second unsuccessful telephone outreach was attempted today. The patient was referred to the case management team for assistance with care management and care coordination.   Follow Up Plan: A HIPAA compliant phone message was left for the patient providing contact information and requesting a return call.  If patient returns call to provider office, please advise to call Embedded Care Management Care Guide Sireen Halk at [place], [place] Management  Direct Dial: [phone]

## 2021-11-16 NOTE — Chronic Care Management (AMB) (Signed)
  Chronic Care Management   Note  11/16/2021 Name: Charles Sellers. MRN: [id number] DOB: 11/20/1932  Charles Sellers. is a 87 y.o. year old male who is a primary care patient of [name], [place]. I reached out to Charles Sellers. by phone today in response to a referral sent by [name] PCP.  Mr. Hasz was given information about Chronic Care Management services today including:  CCM service includes personalized support from designated clinical staff supervised by his physician, including individualized plan of care and coordination with other care providers 24/7 contact phone numbers for assistance for urgent and routine care needs. Service will only be billed when office clinical staff spend 20 minutes or more in a month to coordinate care. Only one practitioner may furnish and bill the service in a calendar month. The patient may stop CCM services at any time (effective at the end of the month) by phone call to the office staff. The patient is responsible for co-pay (up to 20% after annual deductible is met) if co-pay is required by the individual health plan.   Patient agreed to services and verbal consent obtained.   Follow up plan: Telephone appointment with care management team member scheduled for: 11/21/2021  Julian Hy, [place] Management  Direct Dial: [phone]

## 2021-11-20 ENCOUNTER — Telehealth: Payer: Self-pay

## 2021-11-20 NOTE — Progress Notes (Signed)
Chronic Care Management Pharmacy Assistant   Name: Charles Sellers.  MRN: [id number] DOB: 1932-05-24  Initial Visit scheduled with [name], CPP on 11/21/2021 @ 0930 Initial Visit Question Assessment  Conditions to be addressed/monitored: Atrial Fibrillation, CAD, HTN, HLD, DMII, and Mitral regurgitation, Mild aortic stenosis, Rigt BBB/left ant fasc block, Acid Reflux, Thyrotoxicosis, Unspecified Inflammatory Polyarthropathy,   Primary concerns for visit include: I never was able to speak with patient. I tried his home phone and mobile phone. No voicemails so I was unable to leave a message.   Recent office visits:  11/07/2021 Lavon Paganini, MD (PCP Office Visit) for DM- No medication changes noted, lab orders placed, patient instructed to return in 2 months  08/08/2021 [name], PA-C (PCP Office Visit) for Back Pain- No medication changes noted, DG Lumbar Spine order placed, No follow-ups on file  06/19/2021 [name], PA-C (PCP Office Visit) for DM/HLD- Stopped: Prednisone 20 mg due to completed course, Changed: Metoprolol Succinate 25 mg 1 table daily from 1/2 tablet daily, lab orders placed, No follow-ups noted  Recent consult visits:  10/16/2021 Glenetta Hew, MD (Cardiology) for Follow-up- No medication changes noted, EKG 12-Lead, and Echocardiogram complete order placed, patient instructed to follow-up in 1 year  08/31/2021 Gardiner Barefoot, DPM (Podiatry) for Nail Problem- No medication changes noted, no orders placed, patient instructed to return in 10 weeks  05/29/2021 Rico Sheehan, DPM (Podiatry) for Nail Problem- Stopped: Azithromycin 250 mg, No orders placed, Patient instructed to return in 3 months  Hospital visits:  None in previous 6 months  Medications: Outpatient Encounter Medications as of 11/20/2021  Medication Sig Note   apixaban (ELIQUIS) 5 MG TABS tablet Take 1 tablet (5 mg total) by mouth 2 (two) times daily.    APPLE CIDER VINEGAR PO  Take by mouth daily. (Patient not taking: Reported on 11/07/2021) 11/21/2020: occasionally   cholecalciferol (VITAMIN D) 1000 UNITS tablet Take 1,000 Units by mouth daily.    glucose blood (ONETOUCH ULTRA) test strip USE TO TEST BLOOD SUGAR EVERY DAY AS DIRECTED    LIFESCAN FINEPOINT LANCETS MISC Test fasting blood sugar once each morning.    metFORMIN (GLUCOPHAGE) 500 MG tablet TAKE 1 TABLET BY MOUTH TWICE DAILY    metoprolol succinate (TOPROL-XL) 25 MG 24 hr tablet Take 1 tablet (25 mg total) by mouth daily.    Multiple Vitamins-Minerals (ICAPS AREDS 2 PO) Take 1 capsule by mouth daily.    naproxen sodium (ANAPROX) 220 MG tablet Take 220 mg by mouth daily as needed.    nitroGLYCERIN (NITROSTAT) 0.4 MG SL tablet Place 1 tablet (0.4 mg total) under the tongue every 5 (five) minutes as needed for chest pain.    Omega-3 Fatty Acids (FISH OIL) 1000 MG CAPS Take 1,000 mg by mouth daily.    pantoprazole (PROTONIX) 40 MG tablet TAKE 1 TABLET BY MOUTH ON MONDAY, WEDNESDAY, FRIDAY OF EACH WEEK    ramipril (ALTACE) 5 MG capsule Take 1 capsule (5 mg total) by mouth daily.    rosuvastatin (CRESTOR) 10 MG tablet Take 1 tablet (10 mg total) by mouth daily.    No facility-administered encounter medications on file as of 11/20/2021.   Care Gaps: COVID-19 Vaccine Booster 3 Diabetic Foot Exam  Star Rating Drugs: Metformin 500 mg last filled on 08/31/2021 for a 90-Day supply with [facility] Rosuvastatin 10 mg last filled on 11/01/2021 for a 90-Day supply with [facility] Ramipril 5 mg last filled on 08/31/2021 for a 90-Day supply with  [facility]  12/12 Tried calling patient to complete initial questions, but phone went straight to VM and his VM is not setup to LVM. Tried calling the home number but no answer and was unable to LVM  12/12 @ 1618 Tried calling patient again on mobile phone, but phone went straight to VM and his VM is not setup for messages to be left. I also tried  calling him on his home phone again, and there was no answer and I was not able to leave a message.   Lynann Bologna, CPA/CMA Clinical Pharmacist Assistant Phone: [phone]

## 2021-11-21 ENCOUNTER — Ambulatory Visit (INDEPENDENT_AMBULATORY_CARE_PROVIDER_SITE_OTHER): Payer: PPO

## 2021-11-21 DIAGNOSIS — N181 Chronic kidney disease, stage 1: Secondary | ICD-10-CM

## 2021-11-21 DIAGNOSIS — E1169 Type 2 diabetes mellitus with other specified complication: Secondary | ICD-10-CM

## 2021-11-21 DIAGNOSIS — I4821 Permanent atrial fibrillation: Secondary | ICD-10-CM

## 2021-11-21 DIAGNOSIS — K219 Gastro-esophageal reflux disease without esophagitis: Secondary | ICD-10-CM

## 2021-11-21 DIAGNOSIS — Z9861 Coronary angioplasty status: Secondary | ICD-10-CM

## 2021-11-21 DIAGNOSIS — E1159 Type 2 diabetes mellitus with other circulatory complications: Secondary | ICD-10-CM

## 2021-11-21 NOTE — Progress Notes (Signed)
Chronic Care Management Pharmacy Note  12/07/2021 Name:  Charles Sellers. MRN:  [id number] DOB:  May 29, 1932  Summary: Patient presents for initial CCM consult. He reports financial challenges with affording Eliquis, but we are unlikely to get him proved prior to the end of 2022.   Recommendations/Changes made from today's visit: Continue current medications  Plan: CPP follow-up 6 months   Subjective: Charles Sellers. is an 87 y.o. year old male who is a primary patient of [name], Ria Comment, [place].  The CCM team was consulted for assistance with disease management and care coordination needs.    Engaged with patient by telephone for initial visit in response to provider referral for pharmacy case management and/or care coordination services.   Consent to Services:  The patient was given the following information about Chronic Care Management services today, agreed to services, and gave verbal consent: 1. CCM service includes personalized support from designated clinical staff supervised by the primary care provider, including individualized plan of care and coordination with other care providers 2. 24/7 contact phone numbers for assistance for urgent and routine care needs. 3. Service will only be billed when office clinical staff spend 20 minutes or more in a month to coordinate care. 4. Only one practitioner may furnish and bill the service in a calendar month. 5.The patient may stop CCM services at any time (effective at the end of the month) by phone call to the office staff. 6. The patient will be responsible for cost sharing (co-pay) of up to 20% of the service fee (after annual deductible is met). Patient agreed to services and consent obtained.  Patient Care Team: [name], PA-C as PCP - General (Physician Assistant) Leonie Man, MD as PCP - Cardiology (Cardiology) Birder Robson, MD as Referring Physician (Ophthalmology) Birder Robson, MD as Referring Physician  (Ophthalmology) Leonie Man, MD as Consulting Physician (Cardiology) Gardiner Barefoot, DPM as Consulting Physician (Podiatry) Germaine Pomfret, [facility] (Pharmacist)  Recent office visits: 11/07/2021 Charles Paganini, MD (PCP Office Visit) for DM- No medication changes noted, lab orders placed, patient instructed to return in 2 months   08/08/2021 Charles Murders, PA-C (PCP Office Visit) for Back Pain- No medication changes noted, DG Lumbar Spine order placed, No follow-ups on file   06/19/2021 Charles Murders, PA-C (PCP Office Visit) for DM/HLD- Stopped: Prednisone 20 mg due to completed course, Changed: Metoprolol Succinate 25 mg 1 table daily from 1/2 tablet daily, lab orders placed, No follow-ups noted  Recent consult visits: 10/16/2021 Charles Hew, MD (Cardiology) for Follow-up- No medication changes noted, EKG 12-Lead, and Echocardiogram complete order placed, patient instructed to follow-up in 1 year   08/31/2021 Gardiner Barefoot, DPM (Podiatry) for Nail Problem- No medication changes noted, no orders placed, patient instructed to return in 10 weeks   05/29/2021 Charles Sellers, DPM (Podiatry) for Nail Problem- Stopped: Azithromycin 250 mg, No orders placed, Patient instructed to return in 3 months  Hospital visits: None in previous 6 months   Objective:  Lab Results  Component Value Date   CREATININE 0.72 (L) 06/19/2021   BUN 15 06/19/2021   GFRNONAA 84 12/05/2020   GFRAA 97 12/05/2020   NA 141 06/19/2021   K 4.7 06/19/2021   CALCIUM 8.8 06/19/2021   CO2 22 06/19/2021   GLUCOSE 123 (H) 06/19/2021    Lab Results  Component Value Date/Time   HGBA1C 7.3 (A) 09/01/2021 05:50 PM   HGBA1C 7.3 09/01/2021 05:50 PM   HGBA1C 8.0 (H) 12/05/2020 10:59  AM   HGBA1C 7.5 (H) 09/01/2020 11:31 AM   MICROALBUR 50 01/02/2018 09:26 AM   MICROALBUR 50 06/18/2017 08:49 AM    Last diabetic Eye exam:  Lab Results  Component Value Date/Time   HMDIABEYEEXA No Retinopathy 11/23/2020  12:00 AM    Last diabetic Foot exam: No results found for: HMDIABFOOTEX   Lab Results  Component Value Date   CHOL 122 06/19/2021   HDL 36 (L) 06/19/2021   LDLCALC 74 06/19/2021   TRIG 52 06/19/2021   CHOLHDL 3.4 06/19/2021    Hepatic Function Latest Ref Rng & Units 06/19/2021 12/05/2020 09/01/2020  Total Protein 6.0 - 8.5 g/dL 7.2 6.9 7.3  Albumin 3.6 - 4.6 g/dL 4.4 4.2 4.7(H)  AST 0 - 40 IU/L '21 19 17  ' ALT 0 - 44 IU/L '16 19 18  ' Alk Phosphatase 44 - 121 IU/L 85 76 65  Total Bilirubin 0.0 - 1.2 mg/dL 1.2 0.9 1.1    Lab Results  Component Value Date/Time   TSH 1.450 06/19/2021 11:35 AM   TSH 1.150 01/26/2020 11:04 AM    CBC Latest Ref Rng & Units 06/19/2021 12/05/2020 09/01/2020  WBC 3.4 - 10.8 x10E3/uL 7.2 7.7 6.3  Hemoglobin 13.0 - 17.7 g/dL 14.9 15.5 15.0  Hematocrit 37.5 - 51.0 % 44.3 45.5 43.8  Platelets 150 - 450 x10E3/uL 167 169 157    No results found for: VD25OH  Clinical ASCVD: No  The ASCVD Risk score (Arnett DK, et al., 2019) failed to calculate for the following reasons:   The 2019 ASCVD risk score is only valid for ages 65 to 53    Depression screen PHQ 2/9 11/27/2021 11/07/2021 12/05/2020  Decreased Interest 0 0 0  Down, Depressed, Hopeless 1 0 0  PHQ - 2 Score 1 0 0  Altered sleeping 0 1 0  Tired, decreased energy 0 0 0  Change in appetite 0 0 0  Feeling bad or failure about yourself  0 0 0  Trouble concentrating 0 0 0  Moving slowly or fidgety/restless 0 0 0  Suicidal thoughts 0 0 0  PHQ-9 Score 1 1 0  Difficult doing work/chores Not difficult at all Not difficult at all Not difficult at all    Social History   Tobacco Use  Smoking Status Never  Smokeless Tobacco Never   BP Readings from Last 3 Encounters:  11/07/21 138/84  10/16/21 134/78  08/08/21 (!) 152/89   Pulse Readings from Last 3 Encounters:  11/07/21 99  10/16/21 87  08/08/21 89   Wt Readings from Last 3 Encounters:  10/16/21 166 lb 9.6 oz (75.6 kg)  08/08/21 174 lb (78.9  kg)  12/05/20 181 lb (82.1 kg)   BMI Readings from Last 3 Encounters:  10/16/21 26.89 kg/m  08/08/21 27.25 kg/m  12/05/20 28.35 kg/m    Assessment/Interventions: Review of patient past medical history, allergies, medications, health status, including review of consultants reports, laboratory and other test data, was performed as part of comprehensive evaluation and provision of chronic care management services.   SDOH:  (Social Determinants of Health) assessments and interventions performed: Yes SDOH Interventions    Flowsheet Row Most Recent Value  SDOH Interventions   Financial Strain Interventions Intervention Not Indicated  [PAP]      SDOH Screenings   Alcohol Screen: Low Risk    Last Alcohol Screening Score (AUDIT): 0  Depression (PHQ2-9): Low Risk    PHQ-2 Score: 1  Financial Resource Strain: High Risk   Difficulty of Paying  Living Expenses: Hard  Food Insecurity: No Food Insecurity   Worried About Charity fundraiser in the Last Year: Never true   Ran Out of Food in the Last Year: Never true  Housing: Low Risk    Last Housing Risk Score: 0  Physical Activity: Insufficiently Active   Days of Exercise per Week: 2 days   Minutes of Exercise per Session: 60 min  Social Connections: Moderately Isolated   Frequency of Communication with Friends and Family: More than three times a week   Frequency of Social Gatherings with Friends and Family: More than three times a week   Attends Religious Services: More than 4 times per year   Active Member of [employer] or Organizations: No   Attends Archivist Meetings: Never   Marital Status: Widowed  Stress: No Stress Concern Present   Feeling of Stress : Not at all  Tobacco Use: Low Risk    Smoking Tobacco Use: Never   Smokeless Tobacco Use: Never   Passive Exposure: Not on file  Transportation Needs: No Transportation Needs   Lack of Transportation (Medical): No   Lack of Transportation (Non-Medical): No    CCM  Care Plan  Allergies  Allergen Reactions   Niaspan [Niacin Er] Rash   Penicillins Rash    Medications Reviewed Today     Reviewed by Dionisio David, LPN (Licensed Practical Nurse) on 11/27/21 at 63  Med List Status: <None>   Medication Order Taking? Sig Documenting Provider Last Dose Status Informant  apixaban (ELIQUIS) 5 MG TABS tablet [id number] Yes Take 1 tablet (5 mg total) by mouth 2 (two) times daily. Leonie Man, MD Taking Active   glucose blood [facility] ULTRA) test strip [id number] Yes USE TO TEST BLOOD SUGAR EVERY DAY AS DIRECTED Jerrol Banana., MD Taking Active   [facility] LANCETS [place] [id number] Yes Test fasting blood sugar once each morning. Chrismon, Vickki Muff, PA-C Taking Active   metFORMIN (GLUCOPHAGE) 500 MG tablet [id number] Yes TAKE 1 TABLET BY MOUTH TWICE DAILY Chrismon, Vickki Muff, PA-C Taking Active   metoprolol succinate (TOPROL-XL) 25 MG 24 hr tablet [id number] Yes Take 1 tablet (25 mg total) by mouth daily. Chrismon, Vickki Muff, PA-C Taking Active   Multiple Vitamins-Minerals (ICAPS AREDS 2 PO) [id number] Yes Take 1 capsule by mouth daily. [provider] Taking Active   naproxen sodium (ANAPROX) 220 MG tablet [id number] Yes Take 220 mg by mouth daily as needed. [provider] Taking Active   nitroGLYCERIN (NITROSTAT) 0.4 MG SL tablet [id number] Yes Place 1 tablet (0.4 mg total) under the tongue every 5 (five) minutes as needed for chest pain. Chrismon, Vickki Muff, PA-C Taking Active   Omega-3 Fatty Acids (FISH OIL) 1000 MG CAPS [id number] Yes Take 1,000 mg by mouth daily. [provider] Taking Active   pantoprazole (PROTONIX) 40 MG tablet [id number] Yes TAKE 1 TABLET BY MOUTH ON MONDAY, WEDNESDAY, Andrew OF EACH WEEK Leonie Man, MD Taking Active   ramipril (ALTACE) 5 MG capsule [id number] Yes Take 1 capsule (5 mg total) by mouth daily. Leonie Man, MD Taking Active   rosuvastatin (CRESTOR) 10 MG tablet [id number] Yes  Take 1 tablet (10 mg total) by mouth daily. 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   Mitral regurgitation 07/30/2017   History of prolonged Q-T interval on ECG 06/22/2015   Prostate lump 06/22/2015   Tumoral calcinosis 06/22/2015   Diabetes mellitus, type 2 ([place]) 06/22/2015   CAD S/P percutaneous coronary angioplasty    Diabetes mellitus type II, controlled ([place])    Hyperlipidemia associated with type 2 diabetes mellitus ([place])    Hypertension associated with diabetes ([place])    Arthropathia 02/17/2010   Chondrocalcinosis of multiple sites 08/11/2008   Acid reflux 08/11/2008   Cannot sleep 07/23/2008   UNSPECIFIED INFLAMMATORY POLYARTHROPATHY 07/20/2008   MYOSITIS 07/20/2008   FEVER UNSPECIFIED 07/20/2008   EXANTHEM 07/20/2008   Muscle ache 07/16/2008   Disorder of sacrum 06/19/2008   Thyrotoxicosis 06/19/2008    Immunization History  Administered Date(s) Administered   Fluad Quad(high Dose 65+) 10/20/2019, 09/01/2020   Influenza, High Dose Seasonal PF 10/18/2017, 10/24/2018   PFIZER(Purple Top)SARS-COV-2 Vaccination 10/13/2020, 11/15/2020   Pneumococcal Conjugate-13 01/09/2016   Pneumococcal Polysaccharide-23 08/04/2008   Tdap 01/09/2016, 12/18/2019   Zoster Recombinat (Shingrix) 11/04/2019, 01/14/2020    Conditions to be addressed/monitored:  Hypertension, Hyperlipidemia, Diabetes, Atrial Fibrillation, Coronary Artery Disease, and GERD  Care Plan : General Pharmacy (Adult)  Updates made by Germaine Pomfret, RPH since 12/07/2021 12:00 AM     Problem: Hypertension, Hyperlipidemia, Diabetes, Atrial Fibrillation, Coronary  Artery Disease, and GERD   Priority: High     Long-Range Goal: Patient-Specific Goal   Start Date: 12/07/2021  Expected End Date: 12/07/2022  This Visit's Progress: On track  Priority: High  Note:   Current Barriers:  No barriers noted   Pharmacist Clinical Goal(s):  Patient will maintain control of diabetes as evidenced by A1c less than 8%  through collaboration with PharmD and provider.   Interventions: 1:1 collaboration with [name], PA-C regarding development and update of comprehensive plan of care as evidenced by provider attestation and co-signature Inter-disciplinary care team collaboration (see longitudinal plan of care) Comprehensive medication review performed; medication list updated in electronic medical record  Hypertension (BP goal <140/90) -Controlled -Current treatment: Metoprolol XL 25 mg daily  Ramipril 5 mg daily  -Medications previously tried: NA  -Current home readings: Monitors sporadically  -Denies hypotensive/hypertensive symptoms -Recommended to continue current medication  Atrial Fibrillation (Goal: prevent stroke and major bleeding) -Controlled -CHADSVASC: 5 -Current treatment: Rate control: Metoprolol XL 25 mg daily  Anticoagulation: Eliquis 5 mg twice daily  -Medications previously tried: NA -asymptomatic A-Fib, denies unusual or concerning bleeding.  -Recommended to continue current medication  Hyperlipidemia: (LDL goal < 70) -Controlled -Current treatment: Rosuvastatin 10 mg daily  -Medications previously tried: NA  -Recommended to continue current medication  Diabetes (A1c goal <8%) -Controlled -Current medications: Metformin 500 mg twice daily  -Medications previously tried: NA  -Current home glucose readings fasting glucose: 155 (Cherry pie), 167, 142 -Denies hypoglycemic/hyperglycemic symptoms -Current meal patterns: Eats at [employer], fast food for most meals.  ham biscuit + hasbrowns  OR Hamburger drinks: diet Pepsi 1  bottle daily, otherwise water  -Current exercise: Minimial -Recommended to continue current medication  GERD (Goal: Prevent reflux) -Controlled -Current treatment  Pantoprazole 40 mg 1 tablet daily on Mon/Wed/Fri  -Medications previously tried: NA  -Recommended to continue current medication   Patient Goals/Self-Care Activities Patient will:  - check glucose daily before breakfast, document, and provide at future appointments  check blood pressure weekly, document, and provide at future appointments  Follow Up Plan: Telephone follow up appointment with care management team member scheduled for:  05/14/2022 at 8:30 AM      Medication Assistance:  Will apply for Eliquis patient assistance in 2023 when patient qualifies.  Compliance/Adherence/Medication fill history: Care Gaps: COVID-19 Vaccine Booster 3 Diabetic Foot Exam  Star-Rating Drugs: Metformin 500 mg last filled on 08/31/2021 for a 90-Day supply with [facility] Rosuvastatin 10 mg last filled on 11/01/2021 for a 90-Day supply with [facility] Ramipril 5 mg last filled on 08/31/2021 for a 90-Day supply with [facility]  Patient's preferred pharmacy is:  [id number] - [address], [address] [place] [address] Phone: [phone] Fax: [phone]  Uses pill box? Yes Pt endorses 100% compliance  We discussed: Current pharmacy is preferred with insurance plan and patient is satisfied with pharmacy services Patient decided to: Continue current medication management strategy  Care Plan and Follow Up Patient Decision:  Patient agrees to Care Plan and Follow-up.  Plan: Telephone follow up appointment with care management team member scheduled for:  05/14/2022 at 8:30 AM  [name], PharmD, Para [place], [place] [phone]

## 2021-11-23 DIAGNOSIS — E119 Type 2 diabetes mellitus without complications: Secondary | ICD-10-CM | POA: Diagnosis not present

## 2021-11-23 LAB — HM DIABETES EYE EXAM

## 2021-11-27 ENCOUNTER — Ambulatory Visit (INDEPENDENT_AMBULATORY_CARE_PROVIDER_SITE_OTHER): Payer: PPO

## 2021-11-27 DIAGNOSIS — Z0000 Encounter for general adult medical examination without abnormal findings: Secondary | ICD-10-CM

## 2021-11-27 NOTE — Progress Notes (Addendum)
Virtual Visit via Telephone Note  I connected with  Charles Sellers. on 11/27/21 at  2:20 PM EST by telephone and verified that I am speaking with the correct person using two identifiers.  Location: Patient: home Provider: [facility] Persons participating in the virtual visit: [place]   I discussed the limitations, risks, security and privacy concerns of performing an evaluation and management service by telephone and the availability of in person appointments. The patient expressed understanding and agreed to proceed.  Interactive audio and video telecommunications were attempted between this nurse and patient, however failed, due to patient having technical difficulties OR patient did not have access to video capability.  We continued and completed visit with audio only.  Some vital signs may be absent or patient reported.   Dionisio David, LPN  Subjective:   Charles Sellers. is a 87 y.o. male who presents for Medicare Annual/Subsequent preventive examination.  Review of Systems     Cardiac Risk Factors include: advanced age (>69men, >31 women);diabetes mellitus;male gender     Objective:    There were no vitals filed for this visit. There is no height or weight on file to calculate BMI.  Advanced Directives 11/27/2021 11/21/2020 11/17/2019 10/24/2018 10/18/2017 08/03/2015 07/26/2015  Does Patient Have a Medical Advance Directive? Yes Yes Yes Yes Yes Yes Yes  Type of Paramedic of [place];Living will [place];Living will Living will;Healthcare Power of [place];Living will [place];Living will Living will;Healthcare Power of Attorney Living will  Does patient want to make changes to medical advance directive? Yes (Inpatient - patient defers changing a medical advance directive and declines information at this time) - - - - No - Patient declined -  Copy of [place] in Chart? Yes - validated most recent copy scanned in chart (See row information) No - copy requested No - copy requested No - copy requested No - copy requested - -    Current Medications (verified) Outpatient Encounter Medications as of 11/27/2021  Medication Sig   apixaban (ELIQUIS) 5 MG TABS tablet Take 1 tablet (5 mg total) by mouth 2 (two) times daily.   glucose blood (ONETOUCH ULTRA) test strip USE TO TEST BLOOD SUGAR EVERY DAY AS DIRECTED   LIFESCAN FINEPOINT LANCETS MISC Test fasting blood sugar once each morning.   metFORMIN (GLUCOPHAGE) 500 MG tablet TAKE 1 TABLET BY MOUTH TWICE DAILY   metoprolol succinate (TOPROL-XL) 25 MG 24 hr tablet Take 1 tablet (25 mg total) by mouth daily.   Multiple Vitamins-Minerals (ICAPS AREDS 2 PO) Take 1 capsule by mouth daily.   naproxen sodium (ANAPROX) 220 MG tablet Take 220 mg by mouth daily as needed.   nitroGLYCERIN (NITROSTAT) 0.4 MG SL tablet Place 1 tablet (0.4 mg total) under the tongue every 5 (five) minutes as needed for chest pain.   Omega-3 Fatty Acids (FISH OIL) 1000 MG CAPS Take 1,000 mg by mouth daily.   pantoprazole (PROTONIX) 40 MG tablet TAKE 1 TABLET BY MOUTH ON MONDAY, WEDNESDAY, FRIDAY OF EACH WEEK   ramipril (ALTACE) 5 MG capsule Take 1 capsule (5 mg total) by mouth daily.   rosuvastatin (CRESTOR) 10 MG tablet Take 1 tablet (10 mg total) by mouth daily.   No facility-administered encounter medications on file as of 11/27/2021.    Allergies (verified) Niaspan [niacin er] and Penicillins   History: Past Medical History:  Diagnosis Date   Atherosclerosis  CAD (coronary artery disease) 2011 & 2009   Echo, EF =>55% 2011/ Echo, EF =>55% 2009   CAD S/P percutaneous coronary angioplasty 12/13 & 15 2005   PCI - mRCA (Cypher DES 3.5 mm x 18 mm --> 4.78mm); RPL Cypher 2.5 mm x 13 mm;; mLAD Cy a pher DES 3.0 mm x 13 mm (3.5 mm) with Cutting PTCA of D2 ostium;; mid Cx 100% after small OM1.   Cholecystitis     Dermatitis    Diabetes mellitus type II, controlled ([place])    Diet controlled. No medications; he indicates "borderline diabetes"   Dyslipidemia, goal LDL below 70     on statin   Hypertension    Leaky heart valve    Prostate nodule    Ventral hernia    Past Surgical History:  Procedure Laterality Date   APPENDECTOMY     CARDIAC CATHETERIZATION  12/11/2003   Carotid Dopplers  08/10/2013   Bilateral internal carotids < 49%.   CORONARY ANGIOPLASTY WITH STENT PLACEMENT     mRCA Cypher DES 3.5 mm x 18 mm (4.0), RPL Cypher DES 2.5 mm x 13 mm; mLAD 3.0 mm x 13 mm Cypher -> cutting PTCA of jailed D2; previousl PCI of Cx - 100% mid Cx.   NM MYOVIEW LTD  06/09/2012   Small area of basal inferior infarct with no ischemia. Normal EF   Stress Myoview  12/11/2011   normal findings   TRANSTHORACIC ECHOCARDIOGRAM  01/10/2013    EF 55%. Mild-moderate MR   TRANSTHORACIC ECHOCARDIOGRAM  11/09/2020   Normal EF 55 to 60%.  Moderate MR with moderate LA dilation.  Mild aortic sclerosis with no stenosis.  Normal RV, RVP and RAP.   Family History  Problem Relation Age of Onset   Stroke Mother    Liver cancer Father    Diabetes Father    Esophageal cancer Brother    Cerebral aneurysm Daughter    Melanoma Daughter    Cerebral aneurysm Son    Social History   Socioeconomic History   Marital status: Widowed    Spouse name: Not on file   Number of children: 4   Years of education: Not on file   Highest education level: High school graduate  Occupational History   Occupation: retired    Comment: previously worked at [employer] Use   Smoking status: Never   Smokeless tobacco: Never  Vaping Use   Vaping Use: Never used  Substance and Sexual Activity   Alcohol use: No    Alcohol/week: 0.0 standard drinks   Drug use: No   Sexual activity: Not on file  Other Topics Concern   Not on file  Social History Narrative   Widowed father of 3, 9 grandchildren, now 33  great-grandchildren. 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   Using the toilet, but does not do routine exercise. Always on the go.   Never smoked. Does not drink alcohol.   Social Determinants of Health   Financial Resource Strain: Low Risk    Difficulty of Paying Living Expenses: Not hard at all  Food Insecurity: No Food Insecurity   Worried About Charity fundraiser in the Last Year: Never true   Ran Out of Food in the Last Year: Never true  Transportation Needs: No  Transportation Needs   Lack of Transportation (Medical): No   Lack of Transportation (Non-Medical): No  Physical Activity: Insufficiently Active   Days of Exercise per Week: 2 days   Minutes of Exercise per Session: 60 min  Stress: No Stress Concern Present   Feeling of Stress : Not at all  Social Connections: Moderately Isolated   Frequency of Communication with Friends and Family: More than three times a week   Frequency of Social Gatherings with Friends and Family: More than three times a week   Attends Religious Services: More than 4 times per year   Active Member of [employer] or Organizations: No   Attends Archivist Meetings: Never   Marital Status: Widowed    Tobacco Counseling Counseling given: Not Answered   Clinical Intake:  Pre-visit preparation completed: Yes  Pain : No/denies pain     Nutritional Risks: None Diabetes: Yes CBG done?: No Did pt. bring in CBG monitor from home?: No  How often do you need to have someone help you when you read instructions, pamphlets, or other written materials from your doctor or pharmacy?: 1 - Never  Diabetic?yes  Nutrition Risk Assessment:  Has the patient had any N/V/D within the last 2 months?  No  Does the patient have any non-healing wounds?  No  Has the patient had any unintentional weight loss or weight gain?   No   Diabetes:  Is the patient diabetic?  Yes  If diabetic, was a CBG obtained today?  No  Did the patient bring in their glucometer from home?  No  How often do you monitor your CBG's? Every dat.   Financial Strains and Diabetes Management:  Are you having any financial strains with the device, your supplies or your medication? No .  Does the patient want to be seen by Chronic Care Management for management of their diabetes?  No  Would the patient like to be referred to a Nutritionist or for Diabetic Management?  No   Diabetic Exams:  Diabetic Eye Exam: Completed 11/23/20 (negative retinopathy). Overdue for diabetic eye exam. Pt has been advised about the importance in completing this exam.   Diabetic Foot Exam: Completed has appointment 01/07/22. Pt has been advised about the importance in completing this exam.   Interpreter Needed?: No  Information entered by :: Kirke Shaggy, LPN   Activities of Daily Living In your present state of health, do you have any difficulty performing the following activities: 11/27/2021 11/07/2021  Hearing? N N  Vision? N N  Difficulty concentrating or making decisions? N N  Walking or climbing stairs? N N  Dressing or bathing? N N  Doing errands, shopping? N N  Preparing Food and eating ? N -  Using the Toilet? N -  In the past six months, have you accidently leaked urine? N -  Do you have problems with loss of bowel control? N -  Managing your Medications? N -  Managing your Finances? N -  Housekeeping or managing your Housekeeping? N -  Some recent data might be hidden    Patient Care Team: [name], Hershal Coria as PCP - General (Physician Assistant) Leonie Man, MD as PCP - Cardiology (Cardiology) Birder Robson, MD as Referring Physician (Ophthalmology) Birder Robson, MD as Referring Physician (Ophthalmology) Leonie Man, MD as Consulting Physician (Cardiology) Gardiner Barefoot, DPM as Consulting Physician  (Podiatry) Germaine Pomfret, [facility] (Pharmacist)  Indicate any recent Medical Services you may have received from other than [facility]  providers in the past year (date may be approximate).     Assessment:   This is a routine wellness examination for Charles Sellers.  Hearing/Vision screen Hearing Screening - Comments:: Hearing is ok Vision Screening - Comments:: Wears glasses- Dr.Porfilio  Dietary issues and exercise activities discussed: Current Exercise Habits: The patient does not participate in regular exercise at present, Exercise limited by: None identified   Goals Addressed             This Visit's Progress    DIET - EAT MORE FRUITS AND VEGETABLES        Depression Screen PHQ 2/9 Scores 11/27/2021 11/07/2021 12/05/2020 11/21/2020 09/02/2020 11/17/2019 10/24/2018  PHQ - 2 Score 1 0 0 0 0 0 3  PHQ- 9 Score 1 1 0 - 0 - 3  Exception Documentation Patient refusal - - - - - -    Fall Risk Fall Risk  11/27/2021 11/07/2021 12/05/2020 11/21/2020 09/02/2020  Falls in the past year? 1 0 1 1 0  Number falls in past yr: 0 0 0 0 0  Injury with Fall? 0 0 0 0 0  Risk for fall due to : History of fall(s) No Fall Risks - - -  Follow up Falls prevention discussed Falls evaluation completed - Falls prevention discussed -    FALL RISK PREVENTION PERTAINING TO THE HOME:  Any stairs in or around the home? Yes  If so, are there any without handrails? No  Home free of loose throw rugs in walkways, pet beds, electrical cords, etc? Yes  Adequate lighting in your home to reduce risk of falls? Yes   ASSISTIVE DEVICES UTILIZED TO PREVENT FALLS:  Life alert? No  Use of a cane, walker or w/c? No  Grab bars in the bathroom? Yes  Shower chair or bench in shower? No  Elevated toilet seat or a handicapped toilet? No     Cognitive Function:Normal cognitive status assessed by direct observation by this Nurse Health Advisor. No abnormalities found.          Immunizations Immunization History   Administered Date(s) Administered   Fluad Quad(high Dose 65+) 10/20/2019, 09/01/2020   Influenza, High Dose Seasonal PF 10/18/2017, 10/24/2018   PFIZER(Purple Top)SARS-COV-2 Vaccination 10/13/2020, 11/15/2020   Pneumococcal Conjugate-13 01/09/2016   Pneumococcal Polysaccharide-23 08/04/2008   Tdap 01/09/2016, 12/18/2019   Zoster Recombinat (Shingrix) 11/04/2019, 01/14/2020    TDAP status: Up to date  Flu Vaccine status: Declined, Education has been provided regarding the importance of this vaccine but patient still declined. Advised may receive this vaccine at local pharmacy or Health Dept. Aware to provide a copy of the vaccination record if obtained from local pharmacy or Health Dept. Verbalized acceptance and understanding.  Pneumococcal vaccine status: Up to date  Covid-19 vaccine status: Completed vaccines  Qualifies for Shingles Vaccine? Yes   Zostavax completed No   Shingrix Completed?: Yes  Screening Tests Health Maintenance  Topic Date Due   COVID-19 Vaccine (3 - Booster for Pfizer series) 01/10/2021   FOOT EXAM  09/01/2021   OPHTHALMOLOGY EXAM  11/23/2021   INFLUENZA VACCINE  03/09/2022 (Originally 07/10/2021)   HEMOGLOBIN A1C  03/01/2022   TETANUS/TDAP  12/17/2029   Pneumonia Vaccine 33+ Years old  Completed   Zoster Vaccines- Shingrix  Completed   HPV VACCINES  Aged Out    Health Maintenance  Health Maintenance Due  Topic Date Due   COVID-19 Vaccine (3 - Booster for [place] series) 01/10/2021   FOOT EXAM  09/01/2021   OPHTHALMOLOGY  EXAM  11/23/2021    Colorectal cancer screening: Type of screening: Colonoscopy. Completed no. Repeat every (aged out) years  Lung Cancer Screening: (Low Dose CT Chest recommended if Age 31-80 years, 30 pack-year currently smoking OR have quit w/in 15years.)  qualify.  does not   Additional Screening:  Hepatitis C Screening: does not qualify; Completed no  Vision Screening: Recommended annual ophthalmology exams for early  detection of glaucoma and other disorders of the eye. Is the patient up to date with their annual eye exam?  Yes  Who is the provider or what is the name of the office in which the patient attends annual eye exams? Dr.Porfilio If pt is not established with a provider, would they like to be referred to a provider to establish care? No .   Dental Screening: Recommended annual dental exams for proper oral hygiene  Community Resource Referral / Chronic Care Management: CRR required this visit?  No   CCM required this visit?  No      Plan:     I have personally reviewed and noted the following in the patients chart:   Medical and social history Use of alcohol, tobacco or illicit drugs  Current medications and supplements including opioid prescriptions. Patient is not currently taking opioid prescriptions. Functional ability and status Nutritional status Physical activity Advanced directives List of other physicians Hospitalizations, surgeries, and ER visits in previous 12 months Vitals Screenings to include cognitive, depression, and falls Referrals and appointments  In addition, I have reviewed and discussed with patient certain preventive protocols, quality metrics, and best practice recommendations. A written personalized care plan for preventive services as well as general preventive health recommendations were provided to patient.     Dionisio David, LPN   [id number]   Nurse Notes: none    I have reviewed the health advisor's note, was available for consultation, and agree with documentation and plan  Bacigalupo, Dionne Bucy, MD, MPH [facility] 11/30/2021 8:11 AM

## 2021-12-07 ENCOUNTER — Encounter: Payer: Self-pay | Admitting: Podiatry

## 2021-12-07 ENCOUNTER — Ambulatory Visit: Payer: PPO | Admitting: Podiatry

## 2021-12-07 ENCOUNTER — Other Ambulatory Visit: Payer: Self-pay

## 2021-12-07 DIAGNOSIS — N181 Chronic kidney disease, stage 1: Secondary | ICD-10-CM

## 2021-12-07 DIAGNOSIS — E1122 Type 2 diabetes mellitus with diabetic chronic kidney disease: Secondary | ICD-10-CM

## 2021-12-07 DIAGNOSIS — D689 Coagulation defect, unspecified: Secondary | ICD-10-CM

## 2021-12-07 DIAGNOSIS — M79674 Pain in right toe(s): Secondary | ICD-10-CM

## 2021-12-07 DIAGNOSIS — B351 Tinea unguium: Secondary | ICD-10-CM

## 2021-12-07 DIAGNOSIS — M79675 Pain in left toe(s): Secondary | ICD-10-CM

## 2021-12-07 NOTE — Progress Notes (Signed)
This patient returns to my office for at risk foot care.  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  Patient is taking plavix.  This patient is unable to cut nails himself since the patient cannot reach his nails.These nails are painful walking and wearing shoes.  This patient presents for at risk foot care today.  General Appearance  Alert, conversant and in no acute stress.  Vascular  Dorsalis pedis and posterior tibial  pulses are palpable  bilaterally.  Capillary return is within normal limits  bilaterally. Temperature is within normal limits  bilaterally.  Neurologic  Senn-Weinstein monofilament wire test within normal limits  bilaterally. Muscle power within normal limits bilaterally.  Nails Thick disfigured discolored nails with subungual debris  from hallux to fifth toes bilaterally. No evidence of bacterial infection or drainage bilaterally.  Orthopedic  No limitations of motion  feet .  No crepitus or effusions noted.  No bony pathology or digital deformities noted.  Skin  normotropic skin with no porokeratosis noted bilaterally.  No signs of infections or ulcers noted.     Onychomycosis  Pain in right toes  Pain in left toes  Consent was obtained for treatment procedures.   Mechanical debridement of nails 1-5  bilaterally performed with a nail nipper.  Filed with dremel without incident.    Return office visit   3 months                   Told patient to return for periodic foot care and evaluation due to potential at risk complications.   Gardiner Barefoot DPM

## 2021-12-07 NOTE — Patient Instructions (Signed)
Visit Information It was great speaking with you today!  Please let me know if you have any questions about our visit.   Goals Addressed             This Visit's Progress    Track and Manage My Blood Pressure-Hypertension       Timeframe:  Long-Range Goal Priority:  High Start Date: 11/21/2021                            Expected End Date: 11/21/2022                      Follow Up within 90 days   - check blood pressure weekly    Why is this important?   You won't feel high blood pressure, but it can still hurt your blood vessels.  High blood pressure can cause heart or kidney problems. It can also cause a stroke.  Making lifestyle changes like losing a little weight or eating less salt will help.  Checking your blood pressure at home and at different times of the day can help to control blood pressure.  If the doctor prescribes medicine remember to take it the way the doctor ordered.  Call the office if you cannot afford the medicine or if there are questions about it.     Notes:         Patient Care Plan: General Pharmacy (Adult)     Problem Identified: Hypertension, Hyperlipidemia, Diabetes, Atrial Fibrillation, Coronary Artery Disease, and GERD   Priority: High     Long-Range Goal: Patient-Specific Goal   Start Date: 12/07/2021  Expected End Date: 12/07/2022  This Visit's Progress: On track  Priority: High  Note:   Current Barriers:  No barriers noted   Pharmacist Clinical Goal(s):  Patient will maintain control of diabetes as evidenced by A1c less than 8%  through collaboration with PharmD and provider.   Interventions: 1:1 collaboration with [name], PA-C regarding development and update of comprehensive plan of care as evidenced by provider attestation and co-signature Inter-disciplinary care team collaboration (see longitudinal plan of care) Comprehensive medication review performed; medication list updated in electronic medical  record  Hypertension (BP goal <140/90) -Controlled -Current treatment: Metoprolol XL 25 mg daily  Ramipril 5 mg daily  -Medications previously tried: NA  -Current home readings: Monitors sporadically  -Denies hypotensive/hypertensive symptoms -Recommended to continue current medication  Atrial Fibrillation (Goal: prevent stroke and major bleeding) -Controlled -CHADSVASC: 5 -Current treatment: Rate control: Metoprolol XL 25 mg daily  Anticoagulation: Eliquis 5 mg twice daily  -Medications previously tried: NA -asymptomatic A-Fib, denies unusual or concerning bleeding.  -Recommended to continue current medication  Hyperlipidemia: (LDL goal < 70) -Controlled -Current treatment: Rosuvastatin 10 mg daily  -Medications previously tried: NA  -Recommended to continue current medication  Diabetes (A1c goal <8%) -Controlled -Current medications: Metformin 500 mg twice daily  -Medications previously tried: NA  -Current home glucose readings fasting glucose: 155 (Cherry pie), 167, 142 -Denies hypoglycemic/hyperglycemic symptoms -Current meal patterns: Eats at [employer], fast food for most meals.  ham biscuit + hasbrowns  OR Hamburger drinks: diet Pepsi 1 bottle daily, otherwise water  -Current exercise: Minimial -Recommended to continue current medication  GERD (Goal: Prevent reflux) -Controlled -Current treatment  Pantoprazole 40 mg 1 tablet daily on Mon/Wed/Fri  -Medications previously tried: NA  -Recommended to continue current medication   Patient Goals/Self-Care Activities Patient will:  - check glucose  daily before breakfast, document, and provide at future appointments check blood pressure weekly, document, and provide at future appointments  Follow Up Plan: Telephone follow up appointment with care management team member scheduled for:  05/14/2022 at 8:30 AM    Patient agreed to services and verbal consent obtained.   Patient verbalizes understanding of instructions  provided today and agrees to view in [place].   [name], PharmD, Para [place], CPP  Clinical Pharmacist Practitioner  [facility] ([address]) [phone]

## 2021-12-09 DIAGNOSIS — I2510 Atherosclerotic heart disease of native coronary artery without angina pectoris: Secondary | ICD-10-CM

## 2021-12-09 DIAGNOSIS — I4821 Permanent atrial fibrillation: Secondary | ICD-10-CM | POA: Diagnosis not present

## 2021-12-09 DIAGNOSIS — Z9861 Coronary angioplasty status: Secondary | ICD-10-CM | POA: Diagnosis not present

## 2021-12-09 DIAGNOSIS — E1169 Type 2 diabetes mellitus with other specified complication: Secondary | ICD-10-CM | POA: Diagnosis not present

## 2021-12-09 DIAGNOSIS — I152 Hypertension secondary to endocrine disorders: Secondary | ICD-10-CM

## 2021-12-09 DIAGNOSIS — E1122 Type 2 diabetes mellitus with diabetic chronic kidney disease: Secondary | ICD-10-CM | POA: Diagnosis not present

## 2021-12-09 DIAGNOSIS — E1159 Type 2 diabetes mellitus with other circulatory complications: Secondary | ICD-10-CM | POA: Diagnosis not present

## 2021-12-09 DIAGNOSIS — E785 Hyperlipidemia, unspecified: Secondary | ICD-10-CM

## 2021-12-09 DIAGNOSIS — N181 Chronic kidney disease, stage 1: Secondary | ICD-10-CM | POA: Diagnosis not present

## 2021-12-14 ENCOUNTER — Telehealth: Payer: Self-pay

## 2021-12-14 DIAGNOSIS — I10 Essential (primary) hypertension: Secondary | ICD-10-CM

## 2021-12-14 DIAGNOSIS — I2510 Atherosclerotic heart disease of native coronary artery without angina pectoris: Secondary | ICD-10-CM

## 2021-12-14 DIAGNOSIS — Z9861 Coronary angioplasty status: Secondary | ICD-10-CM

## 2021-12-14 MED ORDER — METOPROLOL SUCCINATE ER 25 MG PO TB24: 25.0000 mg | ORAL_TABLET | Freq: Every day | ORAL | 3 refills | Status: DC

## 2021-12-14 NOTE — Telephone Encounter (Signed)
Patient needs refill on Metoprolol 25 mg. Sent to [facility] in [place].

## 2022-01-08 ENCOUNTER — Encounter: Payer: Self-pay | Admitting: Physician Assistant

## 2022-01-08 ENCOUNTER — Ambulatory Visit (INDEPENDENT_AMBULATORY_CARE_PROVIDER_SITE_OTHER): Payer: PPO | Admitting: Physician Assistant

## 2022-01-08 ENCOUNTER — Other Ambulatory Visit: Payer: Self-pay

## 2022-01-08 VITALS — BP 137/92 | HR 91 | Temp 98.4°F | Ht 66.0 in | Wt 168.5 lb

## 2022-01-08 DIAGNOSIS — E1122 Type 2 diabetes mellitus with diabetic chronic kidney disease: Secondary | ICD-10-CM | POA: Diagnosis not present

## 2022-01-08 DIAGNOSIS — E785 Hyperlipidemia, unspecified: Secondary | ICD-10-CM

## 2022-01-08 DIAGNOSIS — E1169 Type 2 diabetes mellitus with other specified complication: Secondary | ICD-10-CM | POA: Diagnosis not present

## 2022-01-08 DIAGNOSIS — E1159 Type 2 diabetes mellitus with other circulatory complications: Secondary | ICD-10-CM | POA: Diagnosis not present

## 2022-01-08 DIAGNOSIS — N181 Chronic kidney disease, stage 1: Secondary | ICD-10-CM | POA: Diagnosis not present

## 2022-01-08 DIAGNOSIS — I4821 Permanent atrial fibrillation: Secondary | ICD-10-CM

## 2022-01-08 DIAGNOSIS — I152 Hypertension secondary to endocrine disorders: Secondary | ICD-10-CM

## 2022-01-08 LAB — POCT GLYCOSYLATED HEMOGLOBIN (HGB A1C): Hemoglobin A1C: 6.9 % — AB (ref 4.0–5.6)

## 2022-01-08 MED ORDER — RAMIPRIL 5 MG PO CAPS: 5.0000 mg | ORAL_CAPSULE | Freq: Every day | ORAL | 2 refills | Status: DC

## 2022-01-08 NOTE — Progress Notes (Signed)
I,[name],acting as a Education administrator for [employer], PA-C.,have documented all relevant documentation on the behalf of Charles Kirschner, PA-C,as directed by  Charles Kirschner, PA-C while in the presence of Charles Kirschner, PA-C. Established patient visit   Patient: Charles Sellers.   DOB: 05/30/1932   88 y.o. Male  MRN: [id number] Visit Date: 01/08/2022  Today's healthcare provider: [name], PA-C   Chief Complaint  Patient presents with   Follow-up   Diabetes   Hyperlipidemia   Subjective    HPI  Diabetes Mellitus Type II, Follow-up  Lab Results  Component Value Date   HGBA1C 6.9 (A) 01/08/2022   HGBA1C 7.3 (A) 09/01/2021   HGBA1C 7.3 09/01/2021   Wt Readings from Last 3 Encounters:  01/08/22 168 lb 8 oz (76.4 kg)  10/16/21 166 lb 9.6 oz (75.6 kg)  08/08/21 174 lb (78.9 kg)   Last seen for diabetes 2 months ago.  Management since then includes continue metformin 500 mg and on ACEi. He reports excellent compliance with treatment. He is not having side effects.  Symptoms: No fatigue No foot ulcerations  No appetite changes No nausea  No paresthesia of the feet  No polydipsia  No polyuria No visual disturbances   No vomiting     Home blood sugar records: fasting range: 125-150  Episodes of hypoglycemia? No denies readings < 100   Current insulin regiment:  Most Recent Eye Exam: December 2022 Current exercise: yard work Current diet habits: well balanced, eats out frequently as his wife is the one who used to cook  Pertinent Labs: Lab Results  Component Value Date   CHOL 122 06/19/2021   HDL 36 (L) 06/19/2021   LDLCALC 74 06/19/2021   TRIG 52 06/19/2021   CHOLHDL 3.4 06/19/2021   Lab Results  Component Value Date   NA 141 06/19/2021   K 4.7 06/19/2021   CREATININE 0.72 (L) 06/19/2021   EGFR 88 06/19/2021   MICROALBUR 50 01/02/2018   LABMICR 96.5 05/30/2020      --------------------------------------------------------------------------------------------------- Lipid/Cholesterol, Follow-up  Last lipid panel Other pertinent labs  Lab Results  Component Value Date   CHOL 122 06/19/2021   HDL 36 (L) 06/19/2021   LDLCALC 74 06/19/2021   TRIG 52 06/19/2021   CHOLHDL 3.4 06/19/2021   Lab Results  Component Value Date   ALT 16 06/19/2021   AST 21 06/19/2021   PLT 167 06/19/2021   TSH 1.450 06/19/2021     He was last seen for this 2 months ago.  Management since that visit includes continue rosuvastatin.  He reports excellent compliance with treatment. He is not having side effects.   Symptoms: No chest pain No chest pressure/discomfort  No dyspnea No lower extremity edema  No numbness or tingling of extremity No orthopnea  No palpitations No paroxysmal nocturnal dyspnea  No speech difficulty No syncope   Current diet: well balanced Current exercise: yard work  The ASCVD Risk score (Arnett DK, et al., 2019) failed to calculate for the following reasons:   The 2019 ASCVD risk score is only valid for ages 49 to 43  ---------------------------------------------------------------------------------------------------   Medications: Outpatient Medications Prior to Visit  Medication Sig   glucose blood (ONETOUCH ULTRA) test strip USE TO TEST BLOOD SUGAR EVERY DAY AS DIRECTED   LIFESCAN FINEPOINT LANCETS MISC Test fasting blood sugar once each morning.   metFORMIN (GLUCOPHAGE) 500 MG tablet TAKE 1 TABLET BY MOUTH TWICE DAILY   metoprolol succinate (TOPROL-XL) 25 MG 24  hr tablet Take 1 tablet (25 mg total) by mouth daily.   Multiple Vitamins-Minerals (ICAPS AREDS 2 PO) Take 1 capsule by mouth daily.   naproxen sodium (ANAPROX) 220 MG tablet Take 220 mg by mouth daily as needed.   nitroGLYCERIN (NITROSTAT) 0.4 MG SL tablet Place 1 tablet (0.4 mg total) under the tongue every 5 (five) minutes as needed for chest pain.   Omega-3 Fatty Acids  (FISH OIL) 1000 MG CAPS Take 1,000 mg by mouth daily.   pantoprazole (PROTONIX) 40 MG tablet TAKE 1 TABLET BY MOUTH ON MONDAY, WEDNESDAY, FRIDAY OF EACH WEEK   rosuvastatin (CRESTOR) 10 MG tablet Take 1 tablet (10 mg total) by mouth daily.   [DISCONTINUED] ramipril (ALTACE) 5 MG capsule Take 1 capsule (5 mg total) by mouth daily.   apixaban (ELIQUIS) 5 MG TABS tablet Take 1 tablet (5 mg total) by mouth 2 (two) times daily.   No facility-administered medications prior to visit.    Review of Systems  Constitutional:  Negative for fatigue and fever.  Respiratory:  Negative for cough and shortness of breath.   Cardiovascular:  Negative for chest pain and leg swelling.  Neurological:  Negative for dizziness and headaches.      Objective    BP (!) 137/92 (BP Location: Left Arm, Patient Position: Sitting, Cuff Size: Normal)    Pulse 91    Temp 98.4 F (36.9 C) (Oral)    Ht _0  (1.676 m)    Wt 168 lb 8 oz (76.4 kg)    SpO2 98%    BMI 27.20 kg/m   Physical Exam Constitutional:      General: He is awake.     Appearance: He is well-developed.  HENT:     Head: Normocephalic.  Eyes:     Conjunctiva/sclera: Conjunctivae normal.  Cardiovascular:     Rate and Rhythm: Normal rate and regular rhythm.     Pulses:          Dorsalis pedis pulses are 3+ on the right side and 3+ on the left side.     Heart sounds: Normal heart sounds.  Pulmonary:     Effort: Pulmonary effort is normal.     Breath sounds: Normal breath sounds.  Skin:    General: Skin is warm.  Neurological:     Mental Status: He is alert and oriented to person, place, and time.  Psychiatric:        Attention and Perception: Attention normal.        Mood and Affect: Mood normal.        Speech: Speech normal.        Behavior: Behavior is cooperative.     Hemoglobin A1C Latest Ref Rng & Units 01/08/2022 09/01/2021 09/01/2021  HGBA1C 4.0 - 5.6 % 6.9(A) 7.3 7.3(A)  Some recent data might be hidden     Assessment & Plan      Problem List Items Addressed This Visit       Cardiovascular and Mediastinum   Permanent atrial fibrillation (HCC); CHA2DS2-VASc score 5 (Chronic)    Pt states eliquis is very expensive, is open to speaking with pharmacist to see if there are other pharmacies/ programs he can take advantage of      Relevant Medications   ramipril (ALTACE) 5 MG capsule   Other Relevant Orders   AMB Referral to [facility]   Hypertension associated with diabetes ([place])    Elevated today Advised he return 1 mo to recheck and consider  inc in ramipril       Relevant Medications   ramipril (ALTACE) 5 MG capsule     Endocrine   Diabetes mellitus type II, controlled ([place]) - Primary (Chronic)    Well controlled 6.9% , pt denies any hypoglycemic episodes Will continue to monitor Encouraged pt to continue to monitor at home      Relevant Medications   ramipril (ALTACE) 5 MG capsule   Other Relevant Orders   POCT HgB A1C (Completed)   Hyperlipidemia associated with type 2 diabetes mellitus ([place]) (Chronic)    On statin well controlled will recheck next physical      Relevant Medications   ramipril (ALTACE) 5 MG capsule    Return in about 1 month (around 02/06/2022) for hypertension.      I, Charles Kirschner, PA-C have reviewed all documentation for this visit. The documentation on  01/08/2022  for the exam, diagnosis, procedures, and orders are all accurate and complete.    Charles Kirschner, PA-C  [facility] [phone] (phone) [phone] (fax)  [place]

## 2022-01-08 NOTE — Assessment & Plan Note (Signed)
Pt states eliquis is very expensive, is open to speaking with pharmacist to see if there are other pharmacies/ programs he can take advantage of

## 2022-01-08 NOTE — Assessment & Plan Note (Signed)
On statin well controlled will recheck next physical

## 2022-01-08 NOTE — Assessment & Plan Note (Signed)
Well controlled 6.9% , pt denies any hypoglycemic episodes Will continue to monitor Encouraged pt to continue to monitor at home

## 2022-01-08 NOTE — Assessment & Plan Note (Signed)
Elevated today Advised he return 1 mo to recheck and consider inc in ramipril

## 2022-01-15 ENCOUNTER — Telehealth: Payer: Self-pay

## 2022-01-15 NOTE — Progress Notes (Signed)
° ° °  Chronic Care Management Pharmacy Assistant   Name: Charles Sellers.  MRN: [id number] DOB: [birth year]  Patient called to be reminded of his telephone appointment with [name], CPP on 01/16/2022 @ 1145.  No answer, and the patient's VM is not setup so I was unable to leave a message to remind the patient about his appointment.    Star Rating Drug: Ramipril 5 mg last filled on 12/05/2021 for a 90-Day supply with [facility] Metformin 500 mg last filled on 12/05/2021 for a 90-Day supply with [facility] Rosuvastatin 10 mg last filled on 11/01/2021 for a 90-Day supply with [facility]   Any gaps in medications fill history? None  Care Gaps: COVID-19 Booster [name], [name] Pharmacist Assistant Phone: [phone]

## 2022-01-16 ENCOUNTER — Ambulatory Visit (INDEPENDENT_AMBULATORY_CARE_PROVIDER_SITE_OTHER): Payer: PPO

## 2022-01-16 DIAGNOSIS — E1169 Type 2 diabetes mellitus with other specified complication: Secondary | ICD-10-CM

## 2022-01-16 DIAGNOSIS — I4821 Permanent atrial fibrillation: Secondary | ICD-10-CM

## 2022-01-16 NOTE — Patient Instructions (Signed)
Visit Information It was great speaking with you today!  Please let me know if you have any questions about our visit.   Goals Addressed             This Visit's Progress    Track and Manage My Blood Pressure-Hypertension   On track    Timeframe:  Long-Range Goal Priority:  High Start Date: 11/21/2021                            Expected End Date: 11/21/2022                      Follow Up within 90 days   - check blood pressure weekly    Why is this important?   You won't feel high blood pressure, but it can still hurt your blood vessels.  High blood pressure can cause heart or kidney problems. It can also cause a stroke.  Making lifestyle changes like losing a little weight or eating less salt will help.  Checking your blood pressure at home and at different times of the day can help to control blood pressure.  If the doctor prescribes medicine remember to take it the way the doctor ordered.  Call the office if you cannot afford the medicine or if there are questions about it.     Notes:         Patient Care Plan: General Pharmacy (Adult)     Problem Identified: Hypertension, Hyperlipidemia, Diabetes, Atrial Fibrillation, Coronary Artery Disease, and GERD   Priority: High     Long-Range Goal: Patient-Specific Goal   Start Date: 12/07/2021  Expected End Date: 12/07/2022  This Visit's Progress: On track  Recent Progress: On track  Priority: High  Note:   Current Barriers:  No barriers noted   Pharmacist Clinical Goal(s):  Patient will maintain control of diabetes as evidenced by A1c less than 8%  through collaboration with PharmD and provider.   Interventions: 1:1 collaboration with [name], PA-C regarding development and update of comprehensive plan of care as evidenced by provider attestation and co-signature Inter-disciplinary care team collaboration (see longitudinal plan of care) Comprehensive medication review performed; medication list updated in  electronic medical record  Hypertension (BP goal <140/90) -Controlled -Current treatment: Metoprolol XL 25 mg daily  Ramipril 5 mg daily  -Medications previously tried: NA  -Current home readings: Monitors sporadically  -Denies hypotensive/hypertensive symptoms -Recommended to continue current medication  Atrial Fibrillation (Goal: prevent stroke and major bleeding) -Controlled -CHADSVASC: 5 -Current treatment: Rate control: Metoprolol XL 25 mg daily: Appropriate, Effective, Safe, Accessible  Anticoagulation: Eliquis 5 mg twice daily: Appropriate, Effective, Safe, Query accessible  -Medications previously tried: NA -asymptomatic A-Fib, denies unusual or concerning bleeding.  -Patient concerned regarding cost of Eliquis, states he is currently able to afford medication. Based on patient income patient will likely need to purchase ~9 months of Eliquis in order to meet out of pocket spending costs. Informed patient of this and to inform me if he enters into [facility].  -Recommended to continue current medication  Hyperlipidemia: (LDL goal < 70) -Controlled -Current treatment: Rosuvastatin 10 mg daily  -Medications previously tried: NA  -Recommended to continue current medication  Diabetes (A1c goal <8%) -Controlled -Current medications: Metformin 500 mg twice daily: Appropriate, Effective, Safe, Accessible -Medications previously tried: NA  -Current home glucose readings fasting glucose: 155 (Cherry pie), 167, 142 -Denies hypoglycemic/hyperglycemic symptoms -Current meal patterns: Eats at  [employer], fast food for most meals.  ham biscuit + hasbrowns  OR Hamburger drinks: diet Pepsi 1 bottle daily, otherwise water  -Current exercise: Minimial -Recommended to continue current medication  GERD (Goal: Prevent reflux) -Controlled -Current treatment  Pantoprazole 40 mg 1 tablet daily on Mon/Wed/Fri  -Medications previously tried: NA  -Recommended to continue current  medication   Patient Goals/Self-Care Activities Patient will:  - check glucose daily before breakfast, document, and provide at future appointments check blood pressure weekly, document, and provide at future appointments  Follow Up Plan: Telephone follow up appointment with care management team member scheduled for:  05/14/2022 at 8:30 AM    Patient agreed to services and verbal consent obtained.   Patient verbalizes understanding of instructions and care plan provided today and agrees to view in [place]. Active MyChart status confirmed with patient.    [name], PharmD, Para [place], CPP  Clinical Pharmacist Practitioner  [facility] [phone]

## 2022-01-16 NOTE — Progress Notes (Signed)
Chronic Care Management Pharmacy Note  01/16/2022 Name:  Charles Sellers. MRN:  [id number] DOB:  10-21-1932  Summary: Patient presents for initial CCM consult. He reports financial challenges with affording Eliquis.  Recommendations/Changes made from today's visit: Continue current medications  Plan: CPP follow-up 6 months   Subjective: Charles Sellers. is an 88 y.o. year old male who is a primary patient of [name], Ria Comment, [place].  The CCM team was consulted for assistance with disease management and care coordination needs.    Engaged with patient by telephone for follow up visit in response to provider referral for pharmacy case management and/or care coordination services.   Consent to Services:  The patient was given information about Chronic Care Management services, agreed to services, and gave verbal consent prior to initiation of services.  Please see initial visit note for detailed documentation.   Patient Care Team: [name], Hershal Coria as PCP - General (Physician Assistant) Leonie Man, MD as PCP - Cardiology (Cardiology) Birder Robson, MD as Referring Physician (Ophthalmology) Birder Robson, MD as Referring Physician (Ophthalmology) Leonie Man, MD as Consulting Physician (Cardiology) Gardiner Barefoot, DPM as Consulting Physician (Podiatry) Germaine Pomfret, [facility] (Pharmacist)  Recent office visits: 01/08/21: Patient presented to [name], PA-C for follow-up. BP 137/92.   11/07/2021 Lavon Paganini, MD (PCP Office Visit) for DM- No medication changes noted, lab orders placed, patient instructed to return in 2 months   08/08/2021 [name], PA-C (PCP Office Visit) for Back Pain- No medication changes noted, DG Lumbar Spine order placed, No follow-ups on file  Recent consult visits: 10/16/2021 Glenetta Hew, MD (Cardiology) for Follow-up- No medication changes noted, EKG 12-Lead, and Echocardiogram complete order placed, patient  instructed to follow-up in 1 year   08/31/2021 Gardiner Barefoot, DPM (Podiatry) for Nail Problem- No medication changes noted, no orders placed, patient instructed to return in 10 weeks   Hospital visits: None in previous 6 months   Objective:  Lab Results  Component Value Date   CREATININE 0.72 (L) 06/19/2021   BUN 15 06/19/2021   GFRNONAA 84 12/05/2020   GFRAA 97 12/05/2020   NA 141 06/19/2021   K 4.7 06/19/2021   CALCIUM 8.8 06/19/2021   CO2 22 06/19/2021   GLUCOSE 123 (H) 06/19/2021    Lab Results  Component Value Date/Time   HGBA1C 6.9 (A) 01/08/2022 11:43 AM   HGBA1C 7.3 (A) 09/01/2021 05:50 PM   HGBA1C 7.3 09/01/2021 05:50 PM   HGBA1C 8.0 (H) 12/05/2020 10:59 AM   HGBA1C 7.5 (H) 09/01/2020 11:31 AM   MICROALBUR 50 01/02/2018 09:26 AM   MICROALBUR 50 06/18/2017 08:49 AM    Last diabetic Eye exam:  Lab Results  Component Value Date/Time   HMDIABEYEEXA No Retinopathy 11/23/2020 12:00 AM    Last diabetic Foot exam: No results found for: HMDIABFOOTEX   Lab Results  Component Value Date   CHOL 122 06/19/2021   HDL 36 (L) 06/19/2021   LDLCALC 74 06/19/2021   TRIG 52 06/19/2021   CHOLHDL 3.4 06/19/2021    Hepatic Function Latest Ref Rng & Units 06/19/2021 12/05/2020 09/01/2020  Total Protein 6.0 - 8.5 g/dL 7.2 6.9 7.3  Albumin 3.6 - 4.6 g/dL 4.4 4.2 4.7(H)  AST 0 - 40 IU/L '21 19 17  ' ALT 0 - 44 IU/L '16 19 18  ' Alk Phosphatase 44 - 121 IU/L 85 76 65  Total Bilirubin 0.0 - 1.2 mg/dL 1.2 0.9 1.1    Lab Results  Component Value  Date/Time   TSH 1.450 06/19/2021 11:35 AM   TSH 1.150 01/26/2020 11:04 AM    CBC Latest Ref Rng & Units 06/19/2021 12/05/2020 09/01/2020  WBC 3.4 - 10.8 x10E3/uL 7.2 7.7 6.3  Hemoglobin 13.0 - 17.7 g/dL 14.9 15.5 15.0  Hematocrit 37.5 - 51.0 % 44.3 45.5 43.8  Platelets 150 - 450 x10E3/uL 167 169 157    No results found for: VD25OH  Clinical ASCVD: No  The ASCVD Risk score (Arnett DK, et al., 2019) failed to calculate for the  following reasons:   The 2019 ASCVD risk score is only valid for ages 51 to 67    Depression screen PHQ 2/9 11/27/2021 11/07/2021 12/05/2020  Decreased Interest 0 0 0  Down, Depressed, Hopeless 1 0 0  PHQ - 2 Score 1 0 0  Altered sleeping 0 1 0  Tired, decreased energy 0 0 0  Change in appetite 0 0 0  Feeling bad or failure about yourself  0 0 0  Trouble concentrating 0 0 0  Moving slowly or fidgety/restless 0 0 0  Suicidal thoughts 0 0 0  PHQ-9 Score 1 1 0  Difficult doing work/chores Not difficult at all Not difficult at all Not difficult at all    Social History   Tobacco Use  Smoking Status Never  Smokeless Tobacco Never   BP Readings from Last 3 Encounters:  01/08/22 (!) 137/92  11/07/21 138/84  10/16/21 134/78   Pulse Readings from Last 3 Encounters:  01/08/22 91  11/07/21 99  10/16/21 87   Wt Readings from Last 3 Encounters:  01/08/22 168 lb 8 oz (76.4 kg)  10/16/21 166 lb 9.6 oz (75.6 kg)  08/08/21 174 lb (78.9 kg)   BMI Readings from Last 3 Encounters:  01/08/22 27.20 kg/m  10/16/21 26.89 kg/m  08/08/21 27.25 kg/m    Assessment/Interventions: Review of patient past medical history, allergies, medications, health status, including review of consultants reports, laboratory and other test data, was performed as part of comprehensive evaluation and provision of chronic care management services.   SDOH:  (Social Determinants of Health) assessments and interventions performed: Yes SDOH Interventions    Flowsheet Row Most Recent Value  SDOH Interventions   Financial Strain Interventions Intervention Not Indicated       SDOH Screenings   Alcohol Screen: Low Risk    Last Alcohol Screening Score (AUDIT): 0  Depression (PHQ2-9): Low Risk    PHQ-2 Score: 1  Financial Resource Strain: Medium Risk   Difficulty of Paying Living Expenses: Somewhat hard  Food Insecurity: No Food Insecurity   Worried About Charity fundraiser in the Last Year: Never true    Ran Out of Food in the Last Year: Never true  Housing: Low Risk    Last Housing Risk Score: 0  Physical Activity: Insufficiently Active   Days of Exercise per Week: 2 days   Minutes of Exercise per Session: 60 min  Social Connections: Moderately Isolated   Frequency of Communication with Friends and Family: More than three times a week   Frequency of Social Gatherings with Friends and Family: More than three times a week   Attends Religious Services: More than 4 times per year   Active Member of [employer] or Organizations: No   Attends Archivist Meetings: Never   Marital Status: Widowed  Stress: No Stress Concern Present   Feeling of Stress : Not at all  Tobacco Use: Low Risk    Smoking Tobacco Use: Never  Smokeless Tobacco Use: Never   Passive Exposure: Not on file  Transportation Needs: No Transportation Needs   Lack of Transportation (Medical): No   Lack of Transportation (Non-Medical): No    CCM Care Plan  Allergies  Allergen Reactions   Niaspan [Niacin Er] Rash   Penicillins Rash    Medications Reviewed Today     Reviewed by [name], PA-C (Physician Assistant Certified) on 93/81/82 at 1147  Med List Status: <None>   Medication Order Taking? Sig Documenting Provider Last Dose Status Informant  apixaban (ELIQUIS) 5 MG TABS tablet [id number]  Take 1 tablet (5 mg total) by mouth 2 (two) times daily. Leonie Man, MD  Expired 12/01/21 2359   glucose blood (ONETOUCH ULTRA) test strip [id number] Yes USE TO TEST BLOOD SUGAR EVERY DAY AS DIRECTED Jerrol Banana., MD Taking Active   [facility] LANCETS [place] [id number] Yes Test fasting blood sugar once each morning. Chrismon, Vickki Muff, PA-C Taking Active   metFORMIN (GLUCOPHAGE) 500 MG tablet [id number] Yes TAKE 1 TABLET BY MOUTH TWICE DAILY Chrismon, Vickki Muff, PA-C Taking Active   metoprolol succinate (TOPROL-XL) 25 MG 24 hr tablet [id number] Yes Take 1 tablet (25 mg total) by mouth daily. Gwyneth Sprout, FNP Taking Active   Multiple Vitamins-Minerals (ICAPS AREDS 2 PO) [id number] Yes Take 1 capsule by mouth daily. [provider] Taking Active   naproxen sodium (ANAPROX) 220 MG tablet [id number] Yes Take 220 mg by mouth daily as needed. [provider] Taking Active   nitroGLYCERIN (NITROSTAT) 0.4 MG SL tablet [id number] Yes Place 1 tablet (0.4 mg total) under the tongue every 5 (five) minutes as needed for chest pain. Chrismon, Vickki Muff, PA-C Taking Active   Omega-3 Fatty Acids (FISH OIL) 1000 MG CAPS [id number] Yes Take 1,000 mg by mouth daily. [provider] Taking Active   pantoprazole (PROTONIX) 40 MG tablet [id number] Yes TAKE 1 TABLET BY MOUTH ON MONDAY, WEDNESDAY, [place] OF EACH WEEK Leonie Man, MD Taking Active   ramipril (ALTACE) 5 MG capsule [id number]  Take 1 capsule (5 mg total) by mouth daily. [name], PA-C  Active   rosuvastatin (CRESTOR) 10 MG tablet [id number] Yes Take 1 tablet (10 mg total) by mouth daily. 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   Mitral regurgitation 07/30/2017   History of prolonged Q-T interval on ECG 06/22/2015   Prostate lump 06/22/2015   Tumoral calcinosis 06/22/2015   Diabetes mellitus, type 2 ([place]) 06/22/2015   CAD S/P percutaneous coronary angioplasty    Diabetes mellitus type II, controlled ([place])    Hyperlipidemia associated with type 2 diabetes mellitus ([place])    Hypertension associated with diabetes ([place])    Arthropathia 02/17/2010   Chondrocalcinosis of multiple sites 08/11/2008   Acid reflux 08/11/2008   Cannot sleep 07/23/2008   UNSPECIFIED INFLAMMATORY  POLYARTHROPATHY 07/20/2008   MYOSITIS 07/20/2008   FEVER UNSPECIFIED 07/20/2008   EXANTHEM 07/20/2008   Muscle ache 07/16/2008   Disorder of sacrum 06/19/2008   Thyrotoxicosis 06/19/2008    Immunization History  Administered Date(s) Administered   Fluad Quad(high  Dose 65+) 10/20/2019, 09/01/2020   Influenza, High Dose Seasonal PF 10/18/2017, 10/24/2018   PFIZER(Purple Top)SARS-COV-2 Vaccination 10/13/2020, 11/15/2020   Pneumococcal Conjugate-13 01/09/2016   Pneumococcal Polysaccharide-23 08/04/2008   Tdap 01/09/2016, 12/18/2019   Zoster Recombinat (Shingrix) 11/04/2019, 01/14/2020    Conditions to be addressed/monitored:  Hypertension, Hyperlipidemia, Diabetes, Atrial Fibrillation, Coronary Artery Disease, and GERD  Care Plan : General Pharmacy (Adult)  Updates made by Germaine Pomfret, RPH since 01/16/2022 12:00 AM     Problem: Hypertension, Hyperlipidemia, Diabetes, Atrial Fibrillation, Coronary Artery Disease, and GERD   Priority: High     Long-Range Goal: Patient-Specific Goal   Start Date: 12/07/2021  Expected End Date: 12/07/2022  This Visit's Progress: On track  Recent Progress: On track  Priority: High  Note:   Current Barriers:  No barriers noted   Pharmacist Clinical Goal(s):  Patient will maintain control of diabetes as evidenced by A1c less than 8%  through collaboration with PharmD and provider.   Interventions: 1:1 collaboration with [name], PA-C regarding development and update of comprehensive plan of care as evidenced by provider attestation and co-signature Inter-disciplinary care team collaboration (see longitudinal plan of care) Comprehensive medication review performed; medication list updated in electronic medical record  Hypertension (BP goal <140/90) -Controlled -Current treatment: Metoprolol XL 25 mg daily  Ramipril 5 mg daily  -Medications previously tried: NA  -Current home readings: Monitors sporadically  -Denies  hypotensive/hypertensive symptoms -Recommended to continue current medication  Atrial Fibrillation (Goal: prevent stroke and major bleeding) -Controlled -CHADSVASC: 5 -Current treatment: Rate control: Metoprolol XL 25 mg daily: Appropriate, Effective, Safe, Accessible  Anticoagulation: Eliquis 5 mg twice daily: Appropriate, Effective, Safe, Query accessible  -Medications previously tried: NA -asymptomatic A-Fib, denies unusual or concerning bleeding.  -Patient concerned regarding cost of Eliquis, states he is currently able to afford medication. Based on patient income patient will likely need to purchase ~9 months of Eliquis in order to meet out of pocket spending costs. Informed patient of this and to inform me if he enters into [facility].  -Recommended to continue current medication  Hyperlipidemia: (LDL goal < 70) -Controlled -Current treatment: Rosuvastatin 10 mg daily  -Medications previously tried: NA  -Recommended to continue current medication  Diabetes (A1c goal <8%) -Controlled -Current medications: Metformin 500 mg twice daily: Appropriate, Effective, Safe, Accessible -Medications previously tried: NA  -Current home glucose readings fasting glucose: 155 (Cherry pie), 167, 142 -Denies hypoglycemic/hyperglycemic symptoms -Current meal patterns: Eats at [employer], fast food for most meals.  ham biscuit + hasbrowns  OR Hamburger drinks: diet Pepsi 1 bottle daily, otherwise water  -Current exercise: Minimial -Recommended to continue current medication  GERD (Goal: Prevent reflux) -Controlled -Current treatment  Pantoprazole 40 mg 1 tablet daily on Mon/Wed/Fri  -Medications previously tried: NA  -Recommended to continue current medication   Patient Goals/Self-Care Activities Patient will:  - check glucose daily before breakfast, document, and provide at future appointments check blood pressure weekly, document, and provide at future appointments  Follow Up Plan:  Telephone follow up appointment with care management team member scheduled for:  05/14/2022 at 8:30 AM       Medication Assistance:  Will apply for Eliquis patient assistance in 2023 when patient qualifies.  Compliance/Adherence/Medication fill history: Care Gaps: COVID-19 Vaccine Booster 3 Diabetic Foot Exam  Star-Rating Drugs: Metformin 500 mg last filled on 08/31/2021 for a 90-Day supply with [facility] Rosuvastatin 10 mg last filled on 11/01/2021 for a 90-Day supply with [facility] Ramipril 5 mg last  filled on 08/31/2021 for a 90-Day supply with [facility]  Patient's preferred pharmacy is:  [facility] [place], [address] [place] [address] Phone: [phone] Fax: [phone]  Uses pill box? Yes Pt endorses 100% compliance  We discussed: Current pharmacy is preferred with insurance plan and patient is satisfied with pharmacy services Patient decided to: Continue current medication management strategy  Care Plan and Follow Up Patient Decision:  Patient agrees to Care Plan and Follow-up.  Plan: Telephone follow up appointment with care management team member scheduled for:  05/14/2022 at 8:30 AM  [name], PharmD, Para [place], [place] [phone]

## 2022-01-21 ENCOUNTER — Other Ambulatory Visit: Payer: Self-pay | Admitting: Cardiology

## 2022-02-02 ENCOUNTER — Other Ambulatory Visit: Payer: Self-pay | Admitting: Cardiology

## 2022-02-02 NOTE — Telephone Encounter (Signed)
Prescription refill request for Eliquis received. Indication:Afib Last office visit:11/22 Scr:0.7 Age: 88 Weight:76.4 kg  Prescription refilled

## 2022-02-05 ENCOUNTER — Encounter: Payer: Self-pay | Admitting: Physician Assistant

## 2022-02-05 ENCOUNTER — Ambulatory Visit (INDEPENDENT_AMBULATORY_CARE_PROVIDER_SITE_OTHER): Payer: PPO | Admitting: Physician Assistant

## 2022-02-05 ENCOUNTER — Other Ambulatory Visit: Payer: Self-pay

## 2022-02-05 VITALS — BP 133/88 | HR 101 | Ht 64.0 in | Wt 170.6 lb

## 2022-02-05 DIAGNOSIS — R29898 Other symptoms and signs involving the musculoskeletal system: Secondary | ICD-10-CM | POA: Insufficient documentation

## 2022-02-05 DIAGNOSIS — R609 Edema, unspecified: Secondary | ICD-10-CM | POA: Insufficient documentation

## 2022-02-05 DIAGNOSIS — R202 Paresthesia of skin: Secondary | ICD-10-CM

## 2022-02-05 DIAGNOSIS — I152 Hypertension secondary to endocrine disorders: Secondary | ICD-10-CM

## 2022-02-05 DIAGNOSIS — E1159 Type 2 diabetes mellitus with other circulatory complications: Secondary | ICD-10-CM

## 2022-02-05 NOTE — Assessment & Plan Note (Signed)
Stable, continue ramipril at 5 mg Explained common side effects to pt and daughter, unlikely it has been causing swelling or weakness

## 2022-02-05 NOTE — Progress Notes (Signed)
Established Patient Office Visit  Subjective:  Patient ID: Charles Depaul., male    DOB: 1932-06-27  Age: 88 y.o. MRN: [id number]  CC:  Chief Complaint  Patient presents with   Hypertension    HPI Charles Sellers. presents for one month hypertension follow-up. --Daughter is present at todays visit. She is concerned over how slow her father has been moving, 'shuffling', more difficult time getting up from a chair. Reports bilateral leg swelling, wonders if it is from his blood pressure medication.  Reports wound on right leg is from an injury outside last week.   Kara states his feet feel like he is 'walking on cotton'. Denies falls.   Hypertension, follow-up  BP Readings from Last 3 Encounters:  02/05/22 133/88  01/08/22 (!) 137/92  11/07/21 138/84   Wt Readings from Last 3 Encounters:  02/05/22 170 lb 9.6 oz (77.4 kg)  01/08/22 168 lb 8 oz (76.4 kg)  10/16/21 166 lb 9.6 oz (75.6 kg)     He was last seen for hypertension 1 months ago.  BP at that visit was 137/92. Management since that visit includes consider inc in ramipril .  He reports excellent compliance with treatment. He is not having side effects.  He is following a Regular diet. He is not exercising. He does not smoke.  Use of agents associated with hypertension: none.   Outside blood pressures are not being checked. Symptoms: No chest pain No chest pressure  No palpitations No syncope  No dyspnea No orthopnea  No paroxysmal nocturnal dyspnea Yes lower extremity edema   Pertinent labs: Lab Results  Component Value Date   CHOL 122 06/19/2021   HDL 36 (L) 06/19/2021   LDLCALC 74 06/19/2021   TRIG 52 06/19/2021   CHOLHDL 3.4 06/19/2021   Lab Results  Component Value Date   NA 141 06/19/2021   K 4.7 06/19/2021   CREATININE 0.72 (L) 06/19/2021   EGFR 88 06/19/2021   GLUCOSE 123 (H) 06/19/2021   TSH 1.450 06/19/2021     The ASCVD Risk score (Arnett DK, et al., 2019) failed to calculate for  the following reasons:   The 2019 ASCVD risk score is only valid for ages 3 to 86   ---------------------------------------------------------------------------------------------------   Past Medical History:  Diagnosis Date   Atherosclerosis    CAD (coronary artery disease) 2011 & 2009   Echo, EF =>55% 2011/ Echo, EF =>55% 2009   CAD S/P percutaneous coronary angioplasty 12/13 & 15 2005   PCI - mRCA (Cypher DES 3.5 mm x 18 mm --> 4.21m); RPL Cypher 2.5 mm x 13 mm;; mLAD Cy a pher DES 3.0 mm x 13 mm (3.5 mm) with Cutting PTCA of D2 ostium;; mid Cx 100% after small OM1.   Cholecystitis    Dermatitis    Diabetes mellitus type II, controlled ([facility]    Diet controlled. No medications; he indicates "borderline diabetes"   Dyslipidemia, goal LDL below 70     on statin   Hypertension    Leaky heart valve    Prostate nodule    Ventral hernia     Past Surgical History:  Procedure Laterality Date   APPENDECTOMY     CARDIAC CATHETERIZATION  12/11/2003   Carotid Dopplers  08/10/2013   Bilateral internal carotids < 49%.   CORONARY ANGIOPLASTY WITH STENT PLACEMENT     mRCA Cypher DES 3.5 mm x 18 mm (4.0), RPL Cypher DES 2.5 mm x 13 mm; mLAD 3.0 mm  x 13 mm Cypher -> cutting PTCA of jailed D2; previousl PCI of Cx - 100% mid Cx.   NM MYOVIEW LTD  06/09/2012   Small area of basal inferior infarct with no ischemia. Normal EF   Stress Myoview  12/11/2011   normal findings   TRANSTHORACIC ECHOCARDIOGRAM  01/10/2013    EF 55%. Mild-moderate MR   TRANSTHORACIC ECHOCARDIOGRAM  11/09/2020   Normal EF 55 to 60%.  Moderate MR with moderate LA dilation.  Mild aortic sclerosis with no stenosis.  Normal RV, RVP and RAP.    Family History  Problem Relation Age of Onset   Stroke Mother    Liver cancer Father    Diabetes Father    Esophageal cancer Brother    Cerebral aneurysm Daughter    Melanoma Daughter    Cerebral aneurysm Son     Social History   Socioeconomic History   Marital status:  Widowed    Spouse name: Not on file   Number of children: 4   Years of education: Not on file   Highest education level: High school graduate  Occupational History   Occupation: retired    Comment: previously worked at [employer] Use   Smoking status: Never   Smokeless tobacco: Never  Vaping Use   Vaping Use: Never used  Substance and Sexual Activity   Alcohol use: No    Alcohol/week: 0.0 standard drinks   Drug use: No   Sexual activity: Not on file  Other Topics Concern   Not on file  Social History Narrative   Widowed father of 3, 9 grandchildren, now 81 great-grandchildren. 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   Using the toilet, but does not do routine exercise. Always on the go.   Never smoked. Does not drink alcohol.   Social Determinants of Health   Financial Resource Strain: Medium Risk   Difficulty of Paying Living Expenses: Somewhat hard  Food Insecurity: No Food Insecurity   Worried About Charity fundraiser in the Last Year: Never true   Ran Out of Food in the Last Year: Never true  Transportation Needs: No Transportation Needs   Lack of Transportation (Medical): No   Lack of Transportation (Non-Medical): No  Physical Activity: Insufficiently Active   Days of Exercise per Week: 2 days   Minutes of Exercise per Session: 60 min  Stress: No Stress Concern Present   Feeling of Stress : Not at all  Social Connections: Moderately Isolated   Frequency of Communication with Friends and Family: More than three times a week   Frequency of Social Gatherings with Friends and Family: More than three times a week   Attends Religious Services: More than 4 times per year   Active Member of [employer] or Organizations: No   Attends Archivist Meetings: Never   Marital Status: Widowed  Human resources officer  Violence: Not At Risk   Fear of Current or Ex-Partner: No   Emotionally Abused: No   Physically Abused: No   Sexually Abused: No    Outpatient Medications Prior to Visit  Medication Sig Dispense Refill   ELIQUIS 5 MG TABS tablet TAKE 1 TABLET(5 MG) BY MOUTH TWICE DAILY 180 tablet 1   glucose blood (ONETOUCH ULTRA) test strip USE TO TEST BLOOD SUGAR EVERY  DAY AS DIRECTED 100 strip 2   LIFESCAN FINEPOINT LANCETS MISC Test fasting blood sugar once each morning. 100 each 3   metFORMIN (GLUCOPHAGE) 500 MG tablet TAKE 1 TABLET BY MOUTH TWICE DAILY 180 tablet 1   metoprolol succinate (TOPROL-XL) 25 MG 24 hr tablet Take 1 tablet (25 mg total) by mouth daily. 90 tablet 3   Multiple Vitamins-Minerals (ICAPS AREDS 2 PO) Take 1 capsule by mouth daily.     naproxen sodium (ANAPROX) 220 MG tablet Take 220 mg by mouth daily as needed.     nitroGLYCERIN (NITROSTAT) 0.4 MG SL tablet Place 1 tablet (0.4 mg total) under the tongue every 5 (five) minutes as needed for chest pain. 25 tablet 3   Omega-3 Fatty Acids (FISH OIL) 1000 MG CAPS Take 1,000 mg by mouth daily.     pantoprazole (PROTONIX) 40 MG tablet TAKE 1 TABLET BY MOUTH ON MONDAY, WEDNESDAY AND FRIDAY OF EACH WEEK 12 tablet 7   ramipril (ALTACE) 5 MG capsule Take 1 capsule (5 mg total) by mouth daily. 90 capsule 2   rosuvastatin (CRESTOR) 10 MG tablet TAKE 1 TABLET(10 MG) BY MOUTH DAILY 90 tablet 1   No facility-administered medications prior to visit.    Allergies  Allergen Reactions   Niaspan [Niacin Er] Rash   Penicillins Rash    ROS Review of Systems  Constitutional:  Negative for fatigue and fever.  Respiratory:  Negative for cough and shortness of breath.   Cardiovascular:  Positive for leg swelling. Negative for chest pain and palpitations.  Neurological:  Positive for numbness. Negative for dizziness and headaches.     Objective:    Physical Exam Constitutional:      Appearance: Normal appearance. He is not ill-appearing.      Comments: Gait observed, nonshuffling, hunched forward. Reports back pain when walking with back straight  HENT:     Head: Normocephalic.  Eyes:     Conjunctiva/sclera: Conjunctivae normal.  Cardiovascular:     Rate and Rhythm: Normal rate and regular rhythm.     Pulses:          Dorsalis pedis pulses are 1+ on the right side and 3+ on the left side.  Pulmonary:     Effort: Pulmonary effort is normal.     Breath sounds: Normal breath sounds.  Musculoskeletal:     Right lower leg: Edema present.     Left lower leg: Edema present.     Comments: Non pitting lower extremity edema, R> L does not extend beyond ankle.  Small healing wound on anterior R shin.  Feet:     Right foot:     Protective Sensation: 3 sites tested.  3 sites sensed.     Left foot:     Protective Sensation: 3 sites tested.  3 sites sensed.     Comments: Sensation intact bilaterally DP right foot less palpable than left. Neurological:     Mental Status: He is oriented to person, place, and time.  Psychiatric:        Mood and Affect: Mood normal.        Behavior: Behavior normal.    BP 133/88 (BP Location: Right Arm, Patient Position: Sitting, Cuff Size: Normal)    Pulse (!) 101    Ht _0  (1.626 m)    Wt 170 lb 9.6 oz (77.4 kg)    SpO2 100%    BMI 29.28 kg/m  Wt Readings from Last 3 Encounters:  02/05/22 170 lb 9.6 oz (  77.4 kg)  01/08/22 168 lb 8 oz (76.4 kg)  10/16/21 166 lb 9.6 oz (75.6 kg)     There are no preventive care reminders to display for this patient.   There are no preventive care reminders to display for this patient.  Lab Results  Component Value Date   TSH 1.450 06/19/2021   Lab Results  Component Value Date   WBC 7.2 06/19/2021   HGB 14.9 06/19/2021   HCT 44.3 06/19/2021   MCV 86 06/19/2021   PLT 167 06/19/2021   Lab Results  Component Value Date   NA 141 06/19/2021   K 4.7 06/19/2021   CO2 22 06/19/2021   GLUCOSE 123 (H) 06/19/2021   BUN 15 06/19/2021   CREATININE 0.72  (L) 06/19/2021   BILITOT 1.2 06/19/2021   ALKPHOS 85 06/19/2021   AST 21 06/19/2021   ALT 16 06/19/2021   PROT 7.2 06/19/2021   ALBUMIN 4.4 06/19/2021   CALCIUM 8.8 06/19/2021   EGFR 88 06/19/2021   Lab Results  Component Value Date   CHOL 122 06/19/2021   Lab Results  Component Value Date   HDL 36 (L) 06/19/2021   Lab Results  Component Value Date   LDLCALC 74 06/19/2021   Lab Results  Component Value Date   TRIG 52 06/19/2021   Lab Results  Component Value Date   CHOLHDL 3.4 06/19/2021   Lab Results  Component Value Date   HGBA1C 6.9 (A) 01/08/2022      Assessment & Plan:   Problem List Items Addressed This Visit       Cardiovascular and Mediastinum   Hypertension associated with diabetes ([place]) - Primary    Stable, continue ramipril at 5 mg Explained common side effects to pt and daughter, unlikely it has been causing swelling or weakness      Relevant Orders   Comprehensive Metabolic Panel (CMET)     Other   Paresthesia of both feet    Will check vit b12 and cmp Likely secondary to DM II      Relevant Orders   Vitamin B12   Comprehensive Metabolic Panel (CMET)   Ambulatory referral to Vascular Surgery   Peripheral edema    Secondary to PAD?  Explained elevation, can use compression socks. Daughter would prefer ref to vascular      Relevant Orders   Ambulatory referral to Vascular Surgery   Weakness of extremity    Observed gait and sit to stand Appears muscular and functional not neurologic Likely he has spinal stenosis d/t hunched ambulation.  Encouraged physical therapy for strengthening and endurance      Relevant Orders   Ambulatory referral to Physical Therapy     Follow-up: Return in about 3 months (around 05/05/2022) for DMII, hypertension.    I, [name], PA-C have reviewed all documentation for this visit. The documentation on  02/05/2022 for the exam, diagnosis, procedures, and orders are all accurate and  complete.  [name], PA-C [facility] [address] [place], [place], [address] Office: [phone] Fax: [phone]

## 2022-02-05 NOTE — Assessment & Plan Note (Signed)
Secondary to PAD?  Explained elevation, can use compression socks. Daughter would prefer ref to vascular

## 2022-02-05 NOTE — Assessment & Plan Note (Signed)
Will check vit b12 and cmp Likely secondary to DM II

## 2022-02-05 NOTE — Assessment & Plan Note (Addendum)
Observed gait and sit to stand Appears muscular and functional not neurologic Likely he has spinal stenosis d/t hunched ambulation.  Encouraged physical therapy for strengthening and endurance

## 2022-02-06 DIAGNOSIS — I4821 Permanent atrial fibrillation: Secondary | ICD-10-CM

## 2022-02-06 DIAGNOSIS — E1169 Type 2 diabetes mellitus with other specified complication: Secondary | ICD-10-CM | POA: Diagnosis not present

## 2022-02-06 LAB — COMPREHENSIVE METABOLIC PANEL
ALT: 13 IU/L (ref 0–44)
AST: 14 IU/L (ref 0–40)
Albumin/Globulin Ratio: 1.6 (ref 1.2–2.2)
Albumin: 4 g/dL (ref 3.6–4.6)
Alkaline Phosphatase: 92 IU/L (ref 44–121)
BUN/Creatinine Ratio: 19 (ref 10–24)
BUN: 13 mg/dL (ref 8–27)
Bilirubin Total: 1.1 mg/dL (ref 0.0–1.2)
CO2: 23 mmol/L (ref 20–29)
Calcium: 8.9 mg/dL (ref 8.6–10.2)
Chloride: 107 mmol/L — ABNORMAL HIGH (ref 96–106)
Creatinine, Ser: 0.69 mg/dL — ABNORMAL LOW (ref 0.76–1.27)
Globulin, Total: 2.5 g/dL (ref 1.5–4.5)
Glucose: 149 mg/dL — ABNORMAL HIGH (ref 70–99)
Potassium: 4.6 mmol/L (ref 3.5–5.2)
Sodium: 146 mmol/L — ABNORMAL HIGH (ref 134–144)
Total Protein: 6.5 g/dL (ref 6.0–8.5)
eGFR: 90 mL/min/{1.73_m2} (ref >59)

## 2022-02-06 LAB — VITAMIN B12: Vitamin B-12: 370 pg/mL (ref 232–1245)

## 2022-02-25 ENCOUNTER — Other Ambulatory Visit: Payer: Self-pay | Admitting: Cardiology

## 2022-02-25 DIAGNOSIS — I152 Hypertension secondary to endocrine disorders: Secondary | ICD-10-CM

## 2022-02-25 DIAGNOSIS — E1122 Type 2 diabetes mellitus with diabetic chronic kidney disease: Secondary | ICD-10-CM

## 2022-02-25 DIAGNOSIS — E1159 Type 2 diabetes mellitus with other circulatory complications: Secondary | ICD-10-CM

## 2022-03-15 ENCOUNTER — Ambulatory Visit: Payer: PPO | Admitting: Podiatry

## 2022-03-15 ENCOUNTER — Encounter: Payer: Self-pay | Admitting: Podiatry

## 2022-03-15 DIAGNOSIS — N181 Chronic kidney disease, stage 1: Secondary | ICD-10-CM | POA: Diagnosis not present

## 2022-03-15 DIAGNOSIS — E1122 Type 2 diabetes mellitus with diabetic chronic kidney disease: Secondary | ICD-10-CM | POA: Diagnosis not present

## 2022-03-15 DIAGNOSIS — D689 Coagulation defect, unspecified: Secondary | ICD-10-CM

## 2022-03-15 DIAGNOSIS — M79675 Pain in left toe(s): Secondary | ICD-10-CM | POA: Diagnosis not present

## 2022-03-15 DIAGNOSIS — B351 Tinea unguium: Secondary | ICD-10-CM | POA: Diagnosis not present

## 2022-03-15 DIAGNOSIS — M79674 Pain in right toe(s): Secondary | ICD-10-CM

## 2022-03-15 NOTE — Progress Notes (Signed)
This patient returns to my office for at risk foot care.  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  Patient is taking plavix.  This patient is unable to cut nails himself since the patient cannot reach his nails.These nails are painful walking and wearing shoes.  This patient presents for at risk foot care today. ? ?General Appearance  Alert, conversant and in no acute stress. ? ?Vascular  Dorsalis pedis and posterior tibial  pulses are palpable  bilaterally.  Capillary return is within normal limits  bilaterally. Temperature is within normal limits  bilaterally. ? ?Neurologic  Senn-Weinstein monofilament wire test within normal limits  bilaterally. Muscle power within normal limits bilaterally. ? ?Nails Thick disfigured discolored nails with subungual debris  from hallux to fifth toes bilaterally. No evidence of bacterial infection or drainage bilaterally. ? ?Orthopedic  No limitations of motion  feet .  No crepitus or effusions noted.  No bony pathology or digital deformities noted. ? ?Skin  normotropic skin with no porokeratosis noted bilaterally.  No signs of infections or ulcers noted.    ? ?Onychomycosis  Pain in right toes  Pain in left toes ? ?Consent was obtained for treatment procedures.   Mechanical debridement of nails 1-5  bilaterally performed with a nail nipper.  Filed with dremel without incident.  ? ? ?Return office visit   10 weeks                 Told patient to return for periodic foot care and evaluation due to potential at risk complications. ? ? ?Gardiner Barefoot DPM  ?

## 2022-03-16 ENCOUNTER — Emergency Department
Admission: EM | Admit: 2022-03-16 | Discharge: 2022-03-16 | Disposition: A | Discharge status: Home / Self Care | Payer: PPO | Attending: Emergency Medicine | Admitting: Emergency Medicine

## 2022-03-16 ENCOUNTER — Encounter: Payer: Self-pay | Admitting: Physician Assistant

## 2022-03-16 ENCOUNTER — Emergency Department: Payer: PPO

## 2022-03-16 ENCOUNTER — Encounter: Payer: Self-pay | Admitting: Emergency Medicine

## 2022-03-16 ENCOUNTER — Ambulatory Visit (INDEPENDENT_AMBULATORY_CARE_PROVIDER_SITE_OTHER): Payer: PPO | Admitting: Physician Assistant

## 2022-03-16 ENCOUNTER — Ambulatory Visit: Payer: Self-pay

## 2022-03-16 ENCOUNTER — Other Ambulatory Visit: Payer: Self-pay

## 2022-03-16 VITALS — BP 164/97 | HR 102 | Ht 64.0 in | Wt 171.3 lb

## 2022-03-16 DIAGNOSIS — W101XXA Fall (on)(from) sidewalk curb, initial encounter: Secondary | ICD-10-CM

## 2022-03-16 DIAGNOSIS — S0120XA Unspecified open wound of nose, initial encounter: Secondary | ICD-10-CM | POA: Diagnosis not present

## 2022-03-16 DIAGNOSIS — W01198A Fall on same level from slipping, tripping and stumbling with subsequent striking against other object, initial encounter: Secondary | ICD-10-CM | POA: Diagnosis not present

## 2022-03-16 DIAGNOSIS — E119 Type 2 diabetes mellitus without complications: Secondary | ICD-10-CM | POA: Insufficient documentation

## 2022-03-16 DIAGNOSIS — S81002A Unspecified open wound, left knee, initial encounter: Secondary | ICD-10-CM | POA: Diagnosis not present

## 2022-03-16 DIAGNOSIS — S0180XA Unspecified open wound of other part of head, initial encounter: Secondary | ICD-10-CM

## 2022-03-16 DIAGNOSIS — I4821 Permanent atrial fibrillation: Secondary | ICD-10-CM | POA: Insufficient documentation

## 2022-03-16 DIAGNOSIS — M25461 Effusion, right knee: Secondary | ICD-10-CM | POA: Insufficient documentation

## 2022-03-16 DIAGNOSIS — W19XXXA Unspecified fall, initial encounter: Secondary | ICD-10-CM

## 2022-03-16 DIAGNOSIS — Z7901 Long term (current) use of anticoagulants: Secondary | ICD-10-CM | POA: Insufficient documentation

## 2022-03-16 DIAGNOSIS — I2510 Atherosclerotic heart disease of native coronary artery without angina pectoris: Secondary | ICD-10-CM | POA: Diagnosis not present

## 2022-03-16 DIAGNOSIS — S81001A Unspecified open wound, right knee, initial encounter: Secondary | ICD-10-CM | POA: Diagnosis not present

## 2022-03-16 DIAGNOSIS — S0990XA Unspecified injury of head, initial encounter: Secondary | ICD-10-CM | POA: Insufficient documentation

## 2022-03-16 DIAGNOSIS — I10 Essential (primary) hypertension: Secondary | ICD-10-CM | POA: Insufficient documentation

## 2022-03-16 MED ORDER — ACETAMINOPHEN 500 MG PO TABS
1000.0000 mg | ORAL_TABLET | Freq: Once | ORAL | Status: AC
Start: 2022-03-16 — End: 2022-03-16
  Administered 2022-03-16: 1000 mg via ORAL
  Filled 2022-03-16: qty 2

## 2022-03-16 NOTE — Telephone Encounter (Signed)
?  Chief Complaint: fall ?Symptoms: fall, hit head on pavement as well as hands and knees ?Frequency: today ?Pertinent Negatives: Patient denies dizziness or confusion ?Disposition: '[]'$ ED /'[]'$ Urgent Care (no appt availability in office) / '[x]'$ Appointment(In office/virtual)/ '[]'$  [facility]/ '[]'$ Home Care/ '[]'$ Refused Recommended Disposition /'[]'$  Mobile Bus/ '[]'$  Follow-up with PCP ?Additional Notes: I advised pt since he fell and hit head on pavement and had a cut to go to ED to be checked out but pt refused and stated he wanted to come to office and be checked out. Scheduled appt for 1340 with PCP.  ? ?Reason for Disposition ? [1] MODERATE weakness (i.e., interferes with work, school, normal activities) AND [2] new-onset or worsening ? ?Answer Assessment - Initial Assessment Questions ?1. MECHANISM: "How did the fall happen?" ?    Missed a step  ? ?3. ONSET: "When did the fall happen?" (e.g., minutes, hours, or days ago) ?    today ?4. LOCATION: "What part of the body hit the ground?" (e.g., back, buttocks, head, hips, knees, hands, head, stomach) ?    Head, hands and kness ?5. INJURY: "Did you hurt (injure) yourself when you fell?" If Yes, ask: "What did you injure? Tell me more about this?" (e.g., body area; type of injury; pain severity)" ?    Head and knee ?6. PAIN: "Is there any pain?" If Yes, ask: "How bad is the pain?" (e.g., Scale 1-10; or mild,  ?moderate, severe) ?  - NONE (0): No pain ?  - MILD (1-3): Doesn't interfere with normal activities  ?  - MODERATE (4-7): Interferes with normal activities or awakens from sleep  ?  - SEVERE (8-10): Excruciating pain, unable to do any normal activities  ?    Head 8, knees 8 ?7. SIZE: For cuts, bruises, or swelling, ask: "How large is it?" (e.g., inches or centimeters)  ?     ? ?9. OTHER SYMPTOMS: "Do you have any other symptoms?" (e.g., dizziness, fever, weakness; new onset or worsening).  ?    no ? ?Protocols used: Falls and Falling-A-AH ? ?
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
## 2022-03-16 NOTE — Discharge Instructions (Signed)
Your CAT scan of your head and neck did not show any acute injury.  You do have small amount of fluid in the right knee likely from either spraining it or from bruising.  If your knee pain is worsening or you are unable to ambulate, please either return to the emergency department or follow-up with your primary care provider.  You can take Tylenol for pain.  If you develop any new numbness weakness or tingling in your extremities, please return to the emergency department. ?

## 2022-03-16 NOTE — Patient Instructions (Signed)
Recommendation to go directly to ED for evaluation of fall w/ head injury on Eliquis 5 mg BID ?Fell on knees, hands, head/nose.  ? ?

## 2022-03-16 NOTE — ED Notes (Signed)
See triage note  presents s/p fall  states he slipped on wet curb  abrasions noted to nose,forehead and right knee ?

## 2022-03-16 NOTE — Assessment & Plan Note (Signed)
D/t head injury w/ local edema and abrasion and pt on eliquis 5 mg BID, strongly advised he go to ED for stat head CT  ?No neuro deficits on quick triage in office. BP elevated but vitals stable ?Able to ambulate with assistance, difficulty in and out of chair (baseline?) ?Advised he mention R knee in ED due to tenderness likely needs xray to r/o fx ?

## 2022-03-16 NOTE — Progress Notes (Signed)
?I,Charles Sellers,acting as a Education administrator for [employer], PA-C.,have documented all relevant documentation on the behalf of Charles Kirschner, PA-C,as directed by  Charles Kirschner, PA-C while in the presence of Charles Kirschner, PA-C. ? ?Established Patient Office Visit ? ?Subjective:  ?Patient ID: Charles Sellers., male    DOB: 04/01/1932  Age: 88 y.o. MRN: [id number] ? ?CC: mechanical fall ? ? ?HPI ?Charles Sellers. presents for a fall today. He missed the curb when trying to step onto it. Ambulates with cane. Witnessed by son. Patient called and reported that he fell and hit his head on the pavement as well as his hands and knees. Was advised by [facility] to visit ED and refused.  ? ?He reports pain behind his right knee, but beyond that no pain. Denies chest pain, SOB, dizziness, headache, changes in vision.  ? ?Past Medical History:  ?Diagnosis Date  ? Atherosclerosis   ? CAD (coronary artery disease) 2011 & 2009  ? Echo, EF =>55% 2011/ Echo, EF =>55% 2009  ? CAD S/P percutaneous coronary angioplasty 12/13 & 15 2005  ? PCI - mRCA (Cypher DES 3.5 mm x 18 mm --> 4.100m); RPL Cypher 2.5 mm x 13 mm;; mLAD Cy a pher DES 3.0 mm x 13 mm (3.5 mm) with Cutting PTCA of D2 ostium;; mid Cx 100% after small OM1.  ? Cholecystitis   ? Dermatitis   ? Diabetes mellitus type II, controlled ([place]   ? Diet controlled. No medications; he indicates "borderline diabetes"  ? Dyslipidemia, goal LDL below 70   ?  on statin  ? Hypertension   ? Leaky heart valve   ? Prostate nodule   ? Ventral hernia   ? ? ?Past Surgical History:  ?Procedure Laterality Date  ? APPENDECTOMY    ? CARDIAC CATHETERIZATION  12/11/2003  ? Carotid Dopplers  08/10/2013  ? Bilateral internal carotids < 49%.  ? CORONARY ANGIOPLASTY WITH STENT PLACEMENT    ? mRCA Cypher DES 3.5 mm x 18 mm (4.0), RPL Cypher DES 2.5 mm x 13 mm; mLAD 3.0 mm x 13 mm Cypher -> cutting PTCA of jailed D2; previousl PCI of Cx - 100% mid Cx.  ? NM MYOVIEW LTD  06/09/2012  ? Small area of basal inferior  infarct with no ischemia. Normal EF  ? Stress Myoview  12/11/2011  ? normal findings  ? TRANSTHORACIC ECHOCARDIOGRAM  01/10/2013  ?  EF 55%. Mild-moderate MR  ? TRANSTHORACIC ECHOCARDIOGRAM  11/09/2020  ? Normal EF 55 to 60%.  Moderate MR with moderate LA dilation.  Mild aortic sclerosis with no stenosis.  Normal RV, RVP and RAP.  ? ? ?Family History  ?Problem Relation Age of Onset  ? Stroke Mother   ? Liver cancer Father   ? Diabetes Father   ? Esophageal cancer Brother   ? Cerebral aneurysm Daughter   ? Melanoma Daughter   ? Cerebral aneurysm Son   ? ? ?Social History  ? ?Socioeconomic History  ? Marital status: Widowed  ?  Spouse name: Not on file  ? Number of children: 4  ? Years of education: Not on file  ? Highest education level: High school graduate  ?Occupational History  ? Occupation: retired  ?  Comment: previously worked at [employer] ?Tobacco Use  ? Smoking status: Never  ? Smokeless tobacco: Never  ?Vaping Use  ? Vaping Use: Never used  ?Substance and Sexual Activity  ? Alcohol use: No  ?  Alcohol/week: 0.0  standard drinks  ? Drug use: No  ? Sexual activity: Not on file  ?Other Topics Concern  ? Not on file  ?Social History Narrative  ? Widowed father of 3, 9 grandchildren, now 61 great-grandchildren. 19 great-grandchildren   ?   ? His wife died on 04/30/2021  ?   ? 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  ? Using the toilet, but does not do routine exercise. Always on the go.  ? Never smoked. Does not drink alcohol.  ? ?Social Determinants of Health  ? ?Financial Resource Strain: Medium Risk  ? Difficulty of Paying Living Expenses: Somewhat hard  ?Food Insecurity: No Food Insecurity  ? Worried About Charity fundraiser in the Last Year: Never true  ? Ran Out of Food in the Last Year: Never true  ?Transportation Needs: No Transportation Needs  ? Lack of Transportation (Medical): No  ? Lack of Transportation  (Non-Medical): No  ?Physical Activity: Insufficiently Active  ? Days of Exercise per Week: 2 days  ? Minutes of Exercise per Session: 60 min  ?Stress: No Stress Concern Present  ? Feeling of Stress : Not at all  ?Social Connections: Moderately Isolated  ? Frequency of Communication with Friends and Family: More than three times a week  ? Frequency of Social Gatherings with Friends and Family: More than three times a week  ? Attends Religious Services: More than 4 times per year  ? Active Member of Clubs or Organizations: No  ? Attends Archivist Meetings: Never  ? Marital Status: Widowed  ?Intimate Partner Violence: Not At Risk  ? Fear of Current or Ex-Partner: No  ? Emotionally Abused: No  ? Physically Abused: No  ? Sexually Abused: No  ? ? ?Outpatient Medications Prior to Visit  ?Medication Sig Dispense Refill  ? ELIQUIS 5 MG TABS tablet TAKE 1 TABLET(5 MG) BY MOUTH TWICE DAILY 180 tablet 1  ? glucose blood (ONETOUCH ULTRA) test strip USE TO TEST BLOOD SUGAR EVERY DAY AS DIRECTED 100 strip 2  ? LIFESCAN FINEPOINT LANCETS MISC Test fasting blood sugar once each morning. 100 each 3  ? metFORMIN (GLUCOPHAGE) 500 MG tablet TAKE 1 TABLET BY MOUTH TWICE DAILY 180 tablet 1  ? metoprolol succinate (TOPROL-XL) 25 MG 24 hr tablet Take 1 tablet (25 mg total) by mouth daily. 90 tablet 3  ? Multiple Vitamins-Minerals (ICAPS AREDS 2 PO) Take 1 capsule by mouth daily.    ? naproxen sodium (ANAPROX) 220 MG tablet Take 220 mg by mouth daily as needed.    ? nitroGLYCERIN (NITROSTAT) 0.4 MG SL tablet Place 1 tablet (0.4 mg total) under the tongue every 5 (five) minutes as needed for chest pain. 25 tablet 3  ? Omega-3 Fatty Acids (FISH OIL) 1000 MG CAPS Take 1,000 mg by mouth daily.    ? pantoprazole (PROTONIX) 40 MG tablet TAKE 1 TABLET BY MOUTH ON MONDAY, WEDNESDAY AND FRIDAY OF EACH WEEK 12 tablet 7  ? ramipril (ALTACE) 5 MG capsule TAKE 1 CAPSULE(5 MG) BY MOUTH DAILY 90 capsule 3  ? rosuvastatin (CRESTOR) 10 MG tablet  TAKE 1 TABLET(10 MG) BY MOUTH DAILY 90 tablet 1  ? ?No facility-administered medications prior to visit.  ? ? ?Allergies  ?Allergen Reactions  ? Niaspan [Niacin Er] Rash  ? Penicillins Rash  ? ? ?ROS ?Review of Systems  ?Constitutional:  Negative for fatigue and fever.  ?Respiratory:  Negative for cough and shortness of breath.   ?Cardiovascular:  Negative for chest pain, palpitations and leg swelling.  ?Musculoskeletal:  Positive for arthralgias, gait problem and joint swelling.  ?Skin:  Positive for color change and wound.  ?Neurological:  Negative for dizziness and headaches.  ? ?  ?Objective:  ?  ?Physical Exam ?Constitutional:   ?   Appearance: Normal appearance. He is not ill-appearing.  ?HENT:  ?   Head:  ?   Comments: Center of forehead with an abrasion and edema. ?   Nose:  ?   Comments: Abrasion to nose ?Eyes:  ?   Conjunctiva/sclera: Conjunctivae normal.  ?Cardiovascular:  ?   Rate and Rhythm: Normal rate.  ?   Pulses: Normal pulses.  ?Pulmonary:  ?   Effort: Pulmonary effort is normal.  ?Musculoskeletal:  ?   Comments: Abrasion R knee with tenderness to posterior knee. B/l knees without edema/effusion  ?Skin: ?   Findings: Lesion present.  ?Neurological:  ?   Mental Status: He is oriented to person, place, and time.  ?Psychiatric:     ?   Mood and Affect: Mood normal.     ?   Behavior: Behavior normal.  ? ? ?There were no vitals taken for this visit. ?Wt Readings from Last 3 Encounters:  ?02/05/22 170 lb 9.6 oz (77.4 kg)  ?01/08/22 168 lb 8 oz (76.4 kg)  ?10/16/21 166 lb 9.6 oz (75.6 kg)  ? ? ? ?Health Maintenance Due  ?Topic Date Due  ? COVID-19 Vaccine (3 - Booster for Pfizer series) 01/10/2021  ? ? ?There are no preventive care reminders to display for this patient. ? ?Lab Results  ?Component Value Date  ? TSH 1.450 06/19/2021  ? ?Lab Results  ?Component Value Date  ? WBC 7.2 06/19/2021  ? HGB 14.9 06/19/2021  ? HCT 44.3 06/19/2021  ? MCV 86 06/19/2021  ? PLT 167 06/19/2021  ? ?Lab Results  ?Component  Value Date  ? NA 146 (H) 02/05/2022  ? K 4.6 02/05/2022  ? CO2 23 02/05/2022  ? GLUCOSE 149 (H) 02/05/2022  ? BUN 13 02/05/2022  ? CREATININE 0.69 (L) 02/05/2022  ? BILITOT 1.1 02/05/2022  ? ALKPHOS 92 02/05/19

## 2022-03-16 NOTE — ED Provider Notes (Signed)
? ?[facility] ?Provider Note ? ? ? Event Date/Time  ? First MD Initiated Contact with Patient 03/16/22 1526   ?  (approximate) ? ? ?History  ? ?Fall ? ? ?HPI ? ?Charles Sellers. is a 88 y.o. male with past medical history of coronary disease, diabetes, hypertension, fibrillation on Eliquis presents after mechanical fall.  Patient was walking to [employer] when he tripped on the curb falling forward hitting his head.  He went through his head and he hit his knee on the ground.  Has been able to ambulate since.  Denies loss of consciousness.  Denies neck pain.  Briefly had numbness in the bilateral hands this lasted for about an hour and is since resolved he denies any ongoing numbness tingling weakness in the arms or legs and denies neck pain.  Denies visual change.  He is on Eliquis for A-fib.  Denies chest or abdominal pain. ?  ? ?Past Medical History:  ?Diagnosis Date  ? Atherosclerosis   ? CAD (coronary artery disease) 2011 & 2009  ? Echo, EF =>55% 2011/ Echo, EF =>55% 2009  ? CAD S/P percutaneous coronary angioplasty 12/13 & 15 2005  ? PCI - mRCA (Cypher DES 3.5 mm x 18 mm --> 4.57m); RPL Cypher 2.5 mm x 13 mm;; mLAD Cy a pher DES 3.0 mm x 13 mm (3.5 mm) with Cutting PTCA of D2 ostium;; mid Cx 100% after small OM1.  ? Cholecystitis   ? Dermatitis   ? Diabetes mellitus type II, controlled ([facility]   ? Diet controlled. No medications; he indicates "borderline diabetes"  ? Dyslipidemia, goal LDL below 70   ?  on statin  ? Hypertension   ? Leaky heart valve   ? Prostate nodule   ? Ventral hernia   ? ? ?Patient Active Problem List  ? Diagnosis Date Noted  ? Paresthesia of both feet 02/05/2022  ? Peripheral edema 02/05/2022  ? Weakness of extremity 02/05/2022  ? Viral URI with cough 11/07/2021  ? Permanent atrial fibrillation ([facility]; CHA2DS2-VASc score 5 10/11/2020  ? Coagulation defect ([facility] 09/01/2020  ? Pain due to onychomycosis of toenails of both feet 07/06/2019  ? Mild aortic stenosis 09/04/2017  ?  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 Mitral regurgitation 07/30/2017  ? History of prolonged Q-T interval on ECG 06/22/2015  ? Prostate lump 06/22/2015  ? 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 Diabetes mellitus, type 2 ([facility] 06/22/2015  ? CAD S/P percutaneous coronary angioplasty   ? Diabetes mellitus type II, controlled ([facility]   ? Hyperlipidemia associated with type 2 diabetes mellitus ([facility]   ? Hypertension associated with diabetes ([facility]   ? Arthropathia 02/17/2010  ? Chondrocalcinosis of multiple sites 08/11/2008  ? Acid reflux 08/11/2008  ? Cannot sleep 07/23/2008  ? UNSPECIFIED INFLAMMATORY POLYARTHROPATHY 07/20/2008  ? MYOSITIS 07/20/2008  ? FEVER UNSPECIFIED 07/20/2008  ? EXANTHEM 07/20/2008  ? Muscle ache 07/16/2008  ? Disorder of sacrum 06/19/2008  ? 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 ? ? ?Physical Exam  ?Triage Vital Signs: ?ED Triage Vitals  ?Enc Vitals Group  ?   BP 03/16/22 1453 (!) 157/84  ?   Pulse Rate 03/16/22 1453 (!) 109  ?   Resp 03/16/22 1453 16  ?   Temp 03/16/22 1453 98.3 ?F (36.8 ?C)  ?   Temp Source 03/16/22 1453 Oral  ?   SpO2 03/16/22 1453 95 %  ?   Weight 03/16/22 1526 169 lb 12.4 oz (77 kg)  ?  Height 03/16/22 1526 '5\' 4"'$  (1.626 m)  ?   Head Circumference --   ?   Peak Flow --   ?   Pain Score --   ?   Pain Loc --   ?   Pain Edu? --   ?   Excl. in [place]? --   ? ? ?Most recent vital signs: ?Vitals:  ? 03/16/22 1453  ?BP: (!) 157/84  ?Pulse: (!) 109  ?Resp: 16  ?Temp: 98.3 ?F (36.8 ?C)  ?SpO2: 95%  ? ? ? ?General: Awake, no distress.  ?CV:  Good peripheral perfusion.  ?Resp:  Normal effort.  ?Abd:  No distention.  Soft and nontender throughout ?Neuro:             Awake, Alert, Oriented x 3  ?Other:  Patient has an abrasion over the forehead, no open laceration ?Ecchymosis over the nasal bridge, no septal hematoma ?PERRLA, EOMI ?No C-spine tenderness ?5/5 strength with grip, elbow flexion and extension, sensation grossly intact in the bilateral upper extremities ?Chest wall is nontender without  crepitus ?Pelvis is stable ? ?With an abrasion, small knee effusion, able to range, straight leg raise intact, no laxity, 2+ DP pulse ? ? ? ?ED Results / Procedures / Treatments  ?Labs ?(all labs ordered are listed, but only abnormal results are displayed) ?Labs Reviewed - No data to display ? ? ?EKG ? ? ? ? ?RADIOLOGY ?I reviewed mx-ray of the right knee which is negative for fracture ? ?I reviewed the CT scan of the brain which does not show any acute intracranial process; agree with radiology report  ? ? ? ?PROCEDURES: ? ?Critical Care performed: No ? ?Procedures ? ? ? ?MEDICATIONS ORDERED IN ED: ?Medications  ?acetaminophen (TYLENOL) tablet 1,000 mg (1,000 mg Oral Given 03/16/22 1544)  ? ? ? ?IMPRESSION / MDM / ASSESSMENT AND PLAN / ED COURSE  ?I reviewed the triage vital signs and the nursing notes. ?             ?               ? ?Differential diagnosis includes, but is not limited to, contusion, intracranial hemorrhage, cervical spine fracture, less likely central cord syndrome, pain, patella fracture ? ?Patient is a 88 year old male who is on Eliquis presents after mechanical fall.  Today he was walking at the [employer] when he tripped on the curb hitting his head without loss of consciousness.  Patient landed on bilateral hands.  Initially did have some numbness of the bilateral hands but this is now resolved and he denies any neurologic symptoms.  On exam he has an abrasion over the forehead some ecchymosis of the nasal bridge as well as has a mild right-sided knee effusion with an abrasion.  He has no cervical spine tenderness and he has full strength with grip elbow flexion and extension and sensation is intact.  Considered central cord syndrome given the mechanism and the numbness in his hands however with no ongoing symptoms no cervical spine tenderness and no C-spine fracture I think this is less likely.  CT head max face obtained from triage are also negative for acute injury.  The abrasion on his head does  not require closure.  It was cleaned by myself.  Tetanus is up-to-date.  X-ray of the knee obtained which does not show any fracture.  Suspect either ligamentous injury or bruising.  Patient is able to ambulate without any other signs of trauma.  Asking to be discharged  which I think is appropriate.  He is stable for discharge at this time. ? ?  ? ? ?FINAL CLINICAL IMPRESSION(S) / ED DIAGNOSES  ? ?Final diagnoses:  ?Fall, initial encounter  ?Knee effusion, right  ? ? ? ?Rx / DC Orders  ? ?ED Discharge Orders   ? ? None  ? ?  ? ? ? ?Note:  This document was prepared using Dragon voice recognition software and may include unintentional dictation errors. ?  ?Rada Hay, MD ?03/16/22 1547 ? ?

## 2022-03-22 ENCOUNTER — Telehealth: Payer: Self-pay

## 2022-03-22 NOTE — Progress Notes (Signed)
? ? ?Chronic Care Management ?Pharmacy Assistant  ? ?Name: Ramaj Frangos.  MRN: [id number] DOB: Jun 22, 1932 ? ?Reason for Encounter: Hypertension Disease State Call ? ?Recent office visits:  ?03/16/2022 [name], PA-C (PCP Office Visit) for Fall- No medication changes noted, No orders placed,  ? ?02/05/2022 [name], PA-C (PCP Office Visit) for HTN- No medication changes noted, Lab order placed, Referral to Vascular Surgery Placed, Referral to PT Placed, Patient to follow-up in 3 months ? ?Recent consult visits:  ?03/15/2022 Gardiner Barefoot, DPM (Podiatry) for Nail Problem- No medication changes noted, No orders placed, Patient to follow-up in 10 weeks ? ?Hospital visits:  ?Medication Reconciliation was completed by comparing discharge summary, patient?s EMR and Pharmacy list, and upon discussion with patient. ? ?Admitted to the hospital on 03/16/2022 due to Fall. Discharge date was 03/16/2022. Discharged from [facility].   ? ?New?Medications Started at [facility] Discharge:?? ?-Started None ID ? ?Medication Changes at Hospital Discharge: ?-Changed None ID ? ?Medications Discontinued at Hospital Discharge: ?-Stopped None ID ? ?Medications that remain the same after Hospital Discharge:??  ?-All other medications will remain the same.   ? ?Medications: ?Outpatient Encounter Medications as of 03/22/2022  ?Medication Sig  ? ELIQUIS 5 MG TABS tablet TAKE 1 TABLET(5 MG) BY MOUTH TWICE DAILY  ? glucose blood (ONETOUCH ULTRA) test strip USE TO TEST BLOOD SUGAR EVERY DAY AS DIRECTED  ? LIFESCAN FINEPOINT LANCETS MISC Test fasting blood sugar once each morning.  ? metFORMIN (GLUCOPHAGE) 500 MG tablet TAKE 1 TABLET BY MOUTH TWICE DAILY  ? metoprolol succinate (TOPROL-XL) 25 MG 24 hr tablet Take 1 tablet (25 mg total) by mouth daily.  ? Multiple Vitamins-Minerals (ICAPS AREDS 2 PO) Take 1 capsule by mouth daily.  ? naproxen sodium (ANAPROX) 220 MG tablet Take 220 mg by mouth  daily as needed.  ? nitroGLYCERIN (NITROSTAT) 0.4 MG SL tablet Place 1 tablet (0.4 mg total) under the tongue every 5 (five) minutes as needed for chest pain.  ? Omega-3 Fatty Acids (FISH OIL) 1000 MG CAPS Take 1,000 mg by mouth daily.  ? pantoprazole (PROTONIX) 40 MG tablet TAKE 1 TABLET BY MOUTH ON MONDAY, WEDNESDAY AND FRIDAY OF EACH WEEK  ? ramipril (ALTACE) 5 MG capsule TAKE 1 CAPSULE(5 MG) BY MOUTH DAILY  ? rosuvastatin (CRESTOR) 10 MG tablet TAKE 1 TABLET(10 MG) BY MOUTH DAILY  ? ?No facility-administered encounter medications on file as of 03/22/2022.  ? ?Care Gaps: ?BP > 140/90 ?COVID-19 Booster 3 ? ?Star Rating Drugs: ?Ramipril 5 mg last filled on 02/27/2022 for a 90-Day supply with [facility] ?Rosuvastatin 10 mg last filled on 01/22/2022 for a 90-Day supply with [facility] ?Metformin 500 mg last filled on 12/05/2022 for a 90-Day supply with [facility] ? ?Reviewed chart prior to disease state call. Spoke with patient regarding BP ? ?Recent Office Vitals: ?BP Readings from Last 3 Encounters:  ?03/16/22 (!) 157/84  ?03/16/22 (!) 164/97  ?02/05/22 133/88  ? ?Pulse Readings from Last 3 Encounters:  ?03/16/22 (!) 109  ?03/16/22 (!) 102  ?02/05/22 (!) 101  ?  ?Wt Readings from Last 3 Encounters:  ?03/16/22 169 lb 12.4 oz (77 kg)  ?03/16/22 171 lb 4.8 oz (77.7 kg)  ?02/05/22 170 lb 9.6 oz (77.4 kg)  ? ?Kidney Function ?Lab Results  ?Component Value Date/Time  ? CREATININE 0.69 (L) 02/05/2022 10:17 AM  ? CREATININE 0.72 (L) 06/19/2021 11:35 AM  ? GFRNONAA 84 12/05/2020 10:59 AM  ?  GFRAA 97 12/05/2020 10:59 AM  ? ? ?  Latest Ref Rng & Units 02/05/2022  ? 10:17 AM 06/19/2021  ? 11:35 AM 12/05/2020  ? 10:59 AM  ?BMP  ?Glucose 70 - 99 mg/dL 149   123   149    ?BUN 8 - 27 mg/dL '13   15   13    '$ ?Creatinine 0.76 - 1.27 mg/dL 0.69   0.72   0.73    ?BUN/Creat Ratio 10 - '24 19   21   18    '$ ?Sodium 134 - 144 mmol/L 146   141   140    ?Potassium 3.5 - 5.2 mmol/L 4.6   4.7   4.2    ?Chloride 96 -  106 mmol/L 107   101   104    ?CO2 20 - 29 mmol/L '23   22   23    '$ ?Calcium 8.6 - 10.2 mg/dL 8.9   8.8   8.9    ? ?Current antihypertensive regimen:  ?Ramipril 5 mg 1 capsule daily ?Eliquis 5 mg 1 tablet twice daily ?Metoprolol Succinate 25 mg 1 tablet daily ? ?What recent interventions/DTPs have been made by any provider to improve Blood Pressure control since last CPP Visit: None ID ? ?Any recent hospitalizations or ED visits since last visit with CPP? Yes due to a fall  ? ?Adherence Review: ?Is the patient currently on ACE/ARB medication? Yes ?Does the patient have >5 day gap between last estimated fill dates? No there is a fill Gap with his Metformin ? ?Patient has a telephone appointment with [name], CPP on 05/14/2022 @ 0830 ? ?04/13 Tried call patient but was unsuccessful as the patient did not answer and his VM is not setup for me to leave a message. ?04/14 Tried call patient but was unsuccessful as the patient did not answer and his VM is not setup for me to leave a message. ?04/19 Tried call patient but was unsuccessful as the patient did not answer and his VM is not setup for me to leave a message. ? ?I have attempted 3 seperate times to contact the patient to complete his monthly call. I was unable to leave a voicemail as his voicemail has not been setup at this time. ? ?Lynann Bologna, CPA/CMA ?Clinical Pharmacist Assistant ?Phone: [phone]  ? ? ? ?

## 2022-05-02 ENCOUNTER — Telehealth: Payer: Self-pay | Admitting: Cardiology

## 2022-05-02 NOTE — Telephone Encounter (Signed)
Called pt's daughter, was on the phone for 20 minutes. She states her dad just falls asleep when sitting still. "We were all talking Mother's day weekend and he just fell asleep. My mother has been gone for almost a year, my dad was taking care of her and he has just went down hill so fast." She wants pt to be seen in [place]. He was added to schedule. She also wants his echo scheduled in [place], that was rescheduled. She thanked me for calling her back.

## 2022-05-02 NOTE — Telephone Encounter (Signed)
Pt c/o Shortness Of Breath: STAT if SOB developed within the last 24 hours or pt is noticeably SOB on the phone  1. Are you currently SOB (can you hear that pt is SOB on the phone)? Was not with patient at time of call  2. How long have you been experiencing SOB? At least a month   3. Are you SOB when sitting or when up moving around? Both   4. Are you currently experiencing any other symptoms? Edema/redness in feet & trouble walking barely able to pick his feet up   Daughter reports patient is falling asleep within 2-3 minutes of sitting down. States the patient told her he fell asleep on the toilet for 2-3 hours. Patient elevates his feet in the evening which helps with swelling some. [name] has not noticed any weight gain in the patient from swelling, all symptoms have been occurring for the past month or so.

## 2022-05-11 ENCOUNTER — Telehealth: Payer: Self-pay

## 2022-05-11 NOTE — Progress Notes (Signed)
    Chronic Care Management Pharmacy Assistant   Name: Charles Sellers.  MRN: [id number] DOB: 1932-06-02  Patient called to be reminded of his telephone appointment with [name], CPP on 05/14/2022 '@0830'$   Spoke with patient's daughter [name] (HIPAA compliant) and provided her with the date, time, and type of appointment. Patient aware to have/bring all medications, supplements, blood pressure and/or blood sugar logs to visit.  Per [name] patient has a face to face with PCP same day at 10 am. She stated he should be okay with the 0830 phone call from [place]. Also per [name] she stated patient does not have a blood pressure machine at home, and doesn't check his blood pressure. She advised that even if he had one she feels he would not be able to check his blood pressure on his own. She stated that she is noticing some declining as he is a little more wobbly when he walks, and it appears there are signs of Dementia. She stated the patient is also having some shortness of breath in which she has made an appointment for him to see Cardio this month.   Star Rating Drug: Ramipril 5 mg last filled on 02/27/2022 for a 90-Day supply with [facility] Rosuvastatin 10 mg last filled on 01/22/2022 for a 90-Day supply with [facility] Metformin 500 mg last filled on 12/05/2021 for a 90-Day supply with [facility]  Any gaps in medications fill history? Yes  Care Gaps: [id number] Booster 3 BP> 140/90  Lynann Bologna, CPA/CMA Clinical Pharmacist Assistant Phone: [phone]

## 2022-05-14 ENCOUNTER — Ambulatory Visit (INDEPENDENT_AMBULATORY_CARE_PROVIDER_SITE_OTHER): Payer: PPO | Admitting: Physician Assistant

## 2022-05-14 ENCOUNTER — Encounter: Payer: Self-pay | Admitting: Physician Assistant

## 2022-05-14 ENCOUNTER — Ambulatory Visit (INDEPENDENT_AMBULATORY_CARE_PROVIDER_SITE_OTHER): Payer: PPO

## 2022-05-14 VITALS — BP 130/81 | HR 88 | Ht 66.0 in | Wt 171.7 lb

## 2022-05-14 DIAGNOSIS — R5383 Other fatigue: Secondary | ICD-10-CM

## 2022-05-14 DIAGNOSIS — E639 Nutritional deficiency, unspecified: Secondary | ICD-10-CM

## 2022-05-14 DIAGNOSIS — E1159 Type 2 diabetes mellitus with other circulatory complications: Secondary | ICD-10-CM

## 2022-05-14 DIAGNOSIS — E1169 Type 2 diabetes mellitus with other specified complication: Secondary | ICD-10-CM

## 2022-05-14 DIAGNOSIS — I4821 Permanent atrial fibrillation: Secondary | ICD-10-CM

## 2022-05-14 DIAGNOSIS — I152 Hypertension secondary to endocrine disorders: Secondary | ICD-10-CM

## 2022-05-14 NOTE — Assessment & Plan Note (Addendum)
Will check a1c, cbc, cmp Has f/u w/ cardio in the next 2 weeks Advised on appropriate sleep hygiene

## 2022-05-14 NOTE — Assessment & Plan Note (Signed)
Well controlled on medication  Reviewed last cmp

## 2022-05-14 NOTE — Addendum Note (Signed)
Addended byMikey Kirschner on: 05/14/2022 11:39 AM   Modules accepted: Orders

## 2022-05-14 NOTE — Assessment & Plan Note (Signed)
Last A1c in expected range Will check A1c today given increased symptoms of fatigue

## 2022-05-14 NOTE — Patient Instructions (Signed)
Visit Information It was great speaking with you today!  Please let me know if you have any questions about our visit.   Goals Addressed             This Visit's Progress    Track and Manage My Blood Pressure-Hypertension   Not on track    Timeframe:  Long-Range Goal Priority:  High Start Date: 11/21/2021                            Expected End Date: 11/21/2022                      Follow Up within 90 days   - check blood pressure weekly    Why is this important?   You won't feel high blood pressure, but it can still hurt your blood vessels.  High blood pressure can cause heart or kidney problems. It can also cause a stroke.  Making lifestyle changes like losing a little weight or eating less salt will help.  Checking your blood pressure at home and at different times of the day can help to control blood pressure.  If the doctor prescribes medicine remember to take it the way the doctor ordered.  Call the office if you cannot afford the medicine or if there are questions about it.     Notes:         Patient Care Plan: General Pharmacy (Adult)     Problem Identified: Hypertension, Hyperlipidemia, Diabetes, Atrial Fibrillation, Coronary Artery Disease, and GERD   Priority: High     Long-Range Goal: Patient-Specific Goal   Start Date: 12/07/2021  Expected End Date: 12/07/2022  This Visit's Progress: On track  Recent Progress: On track  Priority: High  Note:   Current Barriers:  No barriers noted   Pharmacist Clinical Goal(s):  Patient will maintain control of diabetes as evidenced by A1c less than 8%  through collaboration with PharmD and provider.   Interventions: 1:1 collaboration with [name], PA-C regarding development and update of comprehensive plan of care as evidenced by provider attestation and co-signature Inter-disciplinary care team collaboration (see longitudinal plan of care) Comprehensive medication review performed; medication list updated  in electronic medical record  Hypertension (BP goal <140/90) -Controlled -Current treatment: Metoprolol XL 25 mg daily: Appropriate, Effective, Safe, Accessible Ramipril 5 mg daily: Appropriate, Effective, Safe, Accessible  -Medications previously tried: NA  -Current home readings: Does not have home blood pressure monitor.   -Denies hypotensive/hypertensive symptoms -Recommended to continue current medication  Atrial Fibrillation (Goal: prevent stroke and major bleeding) -Controlled -CHADSVASC: 5 -Current treatment: Rate control: Metoprolol XL 25 mg daily: Appropriate, Effective, Safe, Accessible  Anticoagulation: Eliquis 5 mg twice daily: Appropriate, Effective, Safe, Query accessible  -Medications previously tried: NA -asymptomatic A-Fib, denies unusual or concerning bleeding.  -Patient concerned regarding cost of Eliquis, states he is currently able to afford medication. Based on patient income patient will likely need to purchase ~9 months of Eliquis in order to meet out of pocket spending costs. Informed patient of this and to inform me if he enters into [facility].  -Recommended to continue current medication  Hyperlipidemia: (LDL goal < 70) -Controlled: Not addressed during this visit. -Current treatment: Rosuvastatin 10 mg daily  -Medications previously tried: NA  -Recommended to continue current medication  Diabetes (A1c goal <8%) -Controlled: Not addressed during this visit. -Current medications: Metformin 500 mg twice daily: Appropriate, Effective, Safe, Accessible -  Medications previously tried: NA  -Current home glucose readings fasting glucose: NA -Denies hypoglycemic/hyperglycemic symptoms -Current meal patterns: Eats at [employer], fast food for most meals.  ham biscuit + hasbrowns  OR Hamburger drinks: diet Pepsi 1 bottle daily, otherwise water  -Current exercise: Minimial -Recommended to continue current medication  GERD (Goal: Prevent reflux) -Controlled: Not  addressed during this visit. -Current treatment  Pantoprazole 40 mg 1 tablet daily on Mon/Wed/Fri  -Medications previously tried: NA  -Recommended to continue current medication   Patient Goals/Self-Care Activities Patient will:  - check glucose daily before breakfast, document, and provide at future appointments check blood pressure weekly, document, and provide at future appointments  Follow Up Plan: Telephone follow up appointment with care management team member scheduled for:  10/22/2022 at 8:30 AM      Patient agreed to services and verbal consent obtained.   Patient verbalizes understanding of instructions and care plan provided today and agrees to view in [place]. Active MyChart status and patient understanding of how to access instructions and care plan via MyChart confirmed with patient.     [name], PharmD, Para March, CPP  Clinical Pharmacist Practitioner  [facility] [phone]

## 2022-05-14 NOTE — Progress Notes (Addendum)
I,[name],acting as a Education administrator for [employer], PA-C.,have documented all relevant documentation on the behalf of Charles Kirschner, PA-C,as directed by  Charles Kirschner, PA-C while in the presence of Charles Kirschner, PA-C.  Established patient visit   Patient: Charles Sellers.   DOB: 1932-03-05   88 y.o. Male  MRN: [id number] Visit Date: 05/14/2022  Today's healthcare provider: [name], PA-C   Cc. HTN f/u  Subjective    HPI  Reports excessive fatigue/sleepiness recently. When he sits down he will fall asleep. Admits to not sleeping well at night.  Hypertension, follow-up  BP Readings from Last 3 Encounters:  05/14/22 130/81  03/16/22 (!) 157/84  03/16/22 (!) 164/97   Wt Readings from Last 3 Encounters:  05/14/22 171 lb 11.2 oz (77.9 kg)  03/16/22 169 lb 12.4 oz (77 kg)  03/16/22 171 lb 4.8 oz (77.7 kg)     He was last seen for hypertension 3 months ago.  BP at that visit was 133/88. Management since that visit includes continue ramipril at 5 mg.  He reports excellent compliance with treatment. He is not having side effects.  He is following a Regular diet. He is not exercising. He does not smoke.  Use of agents associated with hypertension: none.   Outside blood pressures are not being checked. Symptoms: No chest pain No chest pressure  No palpitations No syncope  Yes dyspnea Yes orthopnea  No paroxysmal nocturnal dyspnea Yes lower extremity edema   Pertinent labs Lab Results  Component Value Date   CHOL 122 06/19/2021   HDL 36 (L) 06/19/2021   LDLCALC 74 06/19/2021   TRIG 52 06/19/2021   CHOLHDL 3.4 06/19/2021   Lab Results  Component Value Date   NA 146 (H) 02/05/2022   K 4.6 02/05/2022   CREATININE 0.69 (L) 02/05/2022   EGFR 90 02/05/2022   GLUCOSE 149 (H) 02/05/2022   TSH 1.450 06/19/2021     The ASCVD Risk score (Arnett DK, et al., 2019) failed to calculate for the following reasons:   The 2019 ASCVD risk score is only valid  for ages 54 to 32  ---------------------------------------------------------------------------------------------------   Medications: Outpatient Medications Prior to Visit  Medication Sig   ELIQUIS 5 MG TABS tablet TAKE 1 TABLET(5 MG) BY MOUTH TWICE DAILY   glucose blood (ONETOUCH ULTRA) test strip USE TO TEST BLOOD SUGAR EVERY DAY AS DIRECTED   LIFESCAN FINEPOINT LANCETS MISC Test fasting blood sugar once each morning.   metFORMIN (GLUCOPHAGE) 500 MG tablet TAKE 1 TABLET BY MOUTH TWICE DAILY   metoprolol succinate (TOPROL-XL) 25 MG 24 hr tablet Take 1 tablet (25 mg total) by mouth daily.   Multiple Vitamins-Minerals (PRESERVISION AREDS 2) CHEW Chew by mouth.   Omega-3 Fatty Acids (FISH OIL) 1000 MG CAPS Take 1,000 mg by mouth daily.   pantoprazole (PROTONIX) 40 MG tablet TAKE 1 TABLET BY MOUTH ON MONDAY, WEDNESDAY AND FRIDAY OF EACH WEEK   ramipril (ALTACE) 5 MG capsule TAKE 1 CAPSULE(5 MG) BY MOUTH DAILY   rosuvastatin (CRESTOR) 10 MG tablet TAKE 1 TABLET(10 MG) BY MOUTH DAILY   Multiple Vitamins-Minerals (ICAPS AREDS 2 PO) Take 1 capsule by mouth daily.   naproxen sodium (ANAPROX) 220 MG tablet Take 220 mg by mouth daily as needed. (Patient not taking: Reported on 05/14/2022)   nitroGLYCERIN (NITROSTAT) 0.4 MG SL tablet Place 1 tablet (0.4 mg total) under the tongue every 5 (five) minutes as needed for chest pain. (Patient not taking:  Reported on 05/14/2022)   No facility-administered medications prior to visit.    Review of Systems  Constitutional:  Positive for fatigue. Negative for fever.  Respiratory:  Negative for cough and shortness of breath.   Cardiovascular:  Negative for chest pain, palpitations and leg swelling.  Neurological:  Negative for dizziness and headaches.      Objective    Blood pressure 130/81, pulse 88, height '5\' 6"'  (1.676 m), weight 171 lb 11.2 oz (77.9 kg), SpO2 100 %.   Physical Exam Constitutional:      General: He is awake.     Appearance: He is  well-developed.  HENT:     Head: Normocephalic.  Eyes:     Conjunctiva/sclera: Conjunctivae normal.  Cardiovascular:     Rate and Rhythm: Normal rate and regular rhythm.     Heart sounds: Murmur heard.  Pulmonary:     Effort: Pulmonary effort is normal.     Breath sounds: Normal breath sounds.  Musculoskeletal:     Right lower leg: No edema.     Left lower leg: No edema.  Skin:    General: Skin is warm.  Neurological:     Mental Status: He is alert and oriented to person, place, and time.  Psychiatric:        Attention and Perception: Attention normal.        Mood and Affect: Mood normal.        Speech: Speech normal.        Behavior: Behavior is cooperative.     No results found for any visits on 05/14/22.  Assessment & Plan     Problem List Items Addressed This Visit       Cardiovascular and Mediastinum   Hypertension associated with diabetes ([place]) - Primary    Well controlled on medication  Reviewed last cmp       Relevant Orders   Comprehensive Metabolic Panel (CMET)     Endocrine   Diabetes mellitus, type 2 (HCC)    Last A1c in expected range Will check A1c today given increased symptoms of fatigue       Relevant Orders   HgB A1c     Other   Other fatigue    Will check a1c, cbc, cmp Has f/u w/ cardio in the next 2 weeks Advised on appropriate sleep hygiene        Relevant Orders   CBC w/Diff/Platelet    Return in about 4 months (around 09/13/2022) for chronic conditions.      I, Charles Kirschner, PA-C have reviewed all documentation for this visit. The documentation on 05/14/2022 for the exam, diagnosis, procedures, and orders are all accurate and complete.  Charles Kirschner, PA-C [facility] [address] [place], [place], [address] Office: [phone] Fax: [place]

## 2022-05-14 NOTE — Progress Notes (Signed)
Chronic Care Management Pharmacy Note  05/14/2022 Name:  Charles Sellers. MRN:  [id number] DOB:  1932-01-30  Summary: Patient presents for initial CCM consult. He reports financial challenges with affording Eliquis.  Recommendations/Changes made from today's visit: Continue current medications  Plan: CPP follow-up 6 months   Subjective: Charles Sellers. is an 88 y.o. year old male who is a primary patient of [name], Ria Comment, [place].  The CCM team was consulted for assistance with disease management and care coordination needs.    Engaged with patient by telephone for follow up visit in response to provider referral for pharmacy case management and/or care coordination services.   Consent to Services:  The patient was given information about Chronic Care Management services, agreed to services, and gave verbal consent prior to initiation of services.  Please see initial visit note for detailed documentation.   Patient Care Team: [name], Hershal Coria as PCP - General (Physician Assistant) Leonie Man, MD as PCP - Cardiology (Cardiology) Birder Robson, MD as Referring Physician (Ophthalmology) Birder Robson, MD as Referring Physician (Ophthalmology) Leonie Man, MD as Consulting Physician (Cardiology) Gardiner Barefoot, DPM as Consulting Physician (Podiatry) Germaine Pomfret, [facility] (Pharmacist)  Recent office visits: 03/16/22: Patient presented to [name], PA-C for fall.   01/08/22: Patient presented to [name], PA-C for follow-up. BP 137/92.    Recent consult visits: 10/16/2021 Glenetta Hew, MD (Cardiology) for Follow-up- No medication changes noted, EKG 12-Lead, and Echocardiogram complete order placed, patient instructed to follow-up in 1 year   08/31/2021 Gardiner Barefoot, DPM (Podiatry) for Nail Problem- No medication changes noted, no orders placed, patient instructed to return in 10 weeks   Hospital visits: None in previous 6  months   Objective:  Lab Results  Component Value Date   CREATININE 0.69 (L) 02/05/2022   BUN 13 02/05/2022   GFRNONAA 84 12/05/2020   GFRAA 97 12/05/2020   NA 146 (H) 02/05/2022   K 4.6 02/05/2022   CALCIUM 8.9 02/05/2022   CO2 23 02/05/2022   GLUCOSE 149 (H) 02/05/2022    Lab Results  Component Value Date/Time   HGBA1C 6.9 (A) 01/08/2022 11:43 AM   HGBA1C 7.3 (A) 09/01/2021 05:50 PM   HGBA1C 7.3 09/01/2021 05:50 PM   HGBA1C 8.0 (H) 12/05/2020 10:59 AM   HGBA1C 7.5 (H) 09/01/2020 11:31 AM   MICROALBUR 50 01/02/2018 09:26 AM   MICROALBUR 50 06/18/2017 08:49 AM    Last diabetic Eye exam:  Lab Results  Component Value Date/Time   HMDIABEYEEXA No Retinopathy 11/23/2021 12:00 AM    Last diabetic Foot exam: No results found for: HMDIABFOOTEX   Lab Results  Component Value Date   CHOL 122 06/19/2021   HDL 36 (L) 06/19/2021   LDLCALC 74 06/19/2021   TRIG 52 06/19/2021   CHOLHDL 3.4 06/19/2021       Latest Ref Rng & Units 02/05/2022   10:17 AM 06/19/2021   11:35 AM 12/05/2020   10:59 AM  Hepatic Function  Total Protein 6.0 - 8.5 g/dL 6.5   7.2   6.9    Albumin 3.6 - 4.6 g/dL 4.0   4.4   4.2    AST 0 - 40 IU/L _0 ALT 0 - 44 IU/L _1 Alk Phosphatase 44 - 121 IU/L 92   85   76    Total Bilirubin 0.0 - 1.2 mg/dL 1.1  1.2   0.9      Lab Results  Component Value Date/Time   TSH 1.450 06/19/2021 11:35 AM   TSH 1.150 01/26/2020 11:04 AM       Latest Ref Rng & Units 06/19/2021   11:35 AM 12/05/2020   10:59 AM 09/01/2020   11:31 AM  CBC  WBC 3.4 - 10.8 x10E3/uL 7.2   7.7   6.3    Hemoglobin 13.0 - 17.7 g/dL 14.9   15.5   15.0    Hematocrit 37.5 - 51.0 % 44.3   45.5   43.8    Platelets 150 - 450 x10E3/uL 167   169   157      No results found for: VD25OH  Clinical ASCVD: No  The ASCVD Risk score (Arnett DK, et al., 2019) failed to calculate for the following reasons:   The 2019 ASCVD risk score is only valid for ages 86 to 82        11/27/2021    2:23 PM 11/07/2021   10:00 AM 12/05/2020   10:19 AM  Depression screen PHQ 2/9  Decreased Interest 0 0 0  Down, Depressed, Hopeless 1 0 0  PHQ - 2 Score 1 0 0  Altered sleeping 0 1 0  Tired, decreased energy 0 0 0  Change in appetite 0 0 0  Feeling bad or failure about yourself  0 0 0  Trouble concentrating 0 0 0  Moving slowly or fidgety/restless 0 0 0  Suicidal thoughts 0 0 0  PHQ-9 Score 1 1 0  Difficult doing work/chores Not difficult at all Not difficult at all Not difficult at all    Social History   Tobacco Use  Smoking Status Never  Smokeless Tobacco Never   BP Readings from Last 3 Encounters:  03/16/22 (!) 157/84  03/16/22 (!) 164/97  02/05/22 133/88   Pulse Readings from Last 3 Encounters:  03/16/22 (!) 109  03/16/22 (!) 102  02/05/22 (!) 101   Wt Readings from Last 3 Encounters:  03/16/22 169 lb 12.4 oz (77 kg)  03/16/22 171 lb 4.8 oz (77.7 kg)  02/05/22 170 lb 9.6 oz (77.4 kg)   BMI Readings from Last 3 Encounters:  03/16/22 29.14 kg/m  03/16/22 29.40 kg/m  02/05/22 29.28 kg/m    Assessment/Interventions: Review of patient past medical history, allergies, medications, health status, including review of consultants reports, laboratory and other test data, was performed as part of comprehensive evaluation and provision of chronic care management services.   SDOH:  (Social Determinants of Health) assessments and interventions performed: Yes    SDOH Screenings   Alcohol Screen: Low Risk    Last Alcohol Screening Score (AUDIT): 0  Depression (PHQ2-9): Low Risk    PHQ-2 Score: 1  Financial Resource Strain: Medium Risk   Difficulty of Paying Living Expenses: Somewhat hard  Food Insecurity: No Food Insecurity   Worried About Charity fundraiser in the Last Year: Never true   Ran Out of Food in the Last Year: Never true  Housing: Low Risk    Last Housing Risk Score: 0  Physical Activity: Insufficiently Active   Days of Exercise per  Week: 2 days   Minutes of Exercise per Session: 60 min  Social Connections: Moderately Isolated   Frequency of Communication with Friends and Family: More than three times a week   Frequency of Social Gatherings with Friends and Family: More than three times a week   Attends Religious Services: More than 4  times per year   Active Member of Clubs or Organizations: No   Attends Archivist Meetings: Never   Marital Status: Widowed  Stress: No Stress Concern Present   Feeling of Stress : Not at all  Tobacco Use: Low Risk    Smoking Tobacco Use: Never   Smokeless Tobacco Use: Never   Passive Exposure: Not on file  Transportation Needs: No Transportation Needs   Lack of Transportation (Medical): No   Lack of Transportation (Non-Medical): No    CCM Care Plan  Allergies  Allergen Reactions   Niaspan [Niacin Er] Rash   Penicillins Rash    Medications Reviewed Today     Reviewed by Arlyce Harman, RN (Registered Nurse) on 03/16/22 at 1532  Med List Status: <None>   Medication Order Taking? Sig Documenting Provider Last Dose Status Informant  ELIQUIS 5 MG TABS tablet [id number]  TAKE 1 TABLET(5 MG) BY MOUTH TWICE DAILY Leonie Man, MD  Active   glucose blood [facility] ULTRA) test strip [id number]  USE TO TEST BLOOD SUGAR EVERY DAY AS DIRECTED Jerrol Banana., MD  Active   [facility] FINEPOINT LANCETS MISC [id number]  Test fasting blood sugar once each morning. Chrismon, Vickki Muff, PA-C  Active   metFORMIN (GLUCOPHAGE) 500 MG tablet [id number]  TAKE 1 TABLET BY MOUTH TWICE DAILY Chrismon, Vickki Muff, PA-C  Active   metoprolol succinate (TOPROL-XL) 25 MG 24 hr tablet [id number]  Take 1 tablet (25 mg total) by mouth daily. Gwyneth Sprout, FNP  Active   Multiple Vitamins-Minerals (ICAPS AREDS 2 PO) [id number]  Take 1 capsule by mouth daily. [provider]  Active   naproxen sodium (ANAPROX) 220 MG tablet [id number]  Take 220 mg by mouth daily as needed. [provider]  Active   nitroGLYCERIN (NITROSTAT) 0.4 MG SL tablet [id number]  Place 1 tablet (0.4 mg total) under the tongue every 5 (five) minutes as needed for chest pain. Chrismon, Vickki Muff, PA-C  Active   Omega-3 Fatty Acids (FISH OIL) 1000 MG CAPS [id number]  Take 1,000 mg by mouth daily. [provider]  Active   pantoprazole (PROTONIX) 40 MG tablet [id number]  TAKE 1 TABLET BY MOUTH ON MONDAY, [facility] AND FRIDAY OF [facility] Leonie Man, MD  Active   ramipril (ALTACE) 5 MG capsule [id number]  TAKE 1 CAPSULE(5 MG) BY MOUTH DAILY Leonie Man, MD  Active   rosuvastatin (CRESTOR) 10 MG tablet [id number]  TAKE 1 TABLET(10 MG) BY MOUTH DAILY Leonie Man, MD  Active             Patient Active Problem List   Diagnosis Date Noted   Fall (on)(from) sidewalk curb, initial encounter 03/16/2022   Paresthesia of both feet 02/05/2022   Peripheral edema 02/05/2022   Weakness of extremity 02/05/2022   Viral URI with cough 11/07/2021   Permanent atrial fibrillation ([place]); CHA2DS2-VASc score 5 10/11/2020   Coagulation defect ([place]) 09/01/2020   Pain due to onychomycosis of toenails of both feet 07/06/2019   Mild aortic stenosis 09/04/2017   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   Mitral regurgitation 07/30/2017   History of prolonged Q-T interval on ECG 06/22/2015   Prostate lump 06/22/2015   Tumoral calcinosis 06/22/2015   Diabetes mellitus, type 2 ([place]) 06/22/2015   CAD S/P percutaneous coronary angioplasty    Diabetes mellitus type II, controlled ([place])    Hyperlipidemia associated with type 2 diabetes mellitus ([place])    Hypertension associated with  diabetes ([place])    Arthropathia 02/17/2010   Chondrocalcinosis of multiple sites 08/11/2008   Acid reflux 08/11/2008   Cannot sleep 07/23/2008   UNSPECIFIED INFLAMMATORY POLYARTHROPATHY 07/20/2008   MYOSITIS 07/20/2008   FEVER UNSPECIFIED 07/20/2008   EXANTHEM 07/20/2008   Muscle ache 07/16/2008   Disorder of sacrum  06/19/2008   Thyrotoxicosis 06/19/2008    Immunization History  Administered Date(s) Administered   Fluad Quad(high Dose 65+) 10/20/2019, 09/01/2020   Influenza, High Dose Seasonal PF 10/18/2017, 10/24/2018   PFIZER(Purple Top)SARS-COV-2 Vaccination 10/13/2020, 11/15/2020   Pneumococcal Conjugate-13 01/09/2016   Pneumococcal Polysaccharide-23 08/04/2008   Tdap 01/09/2016, 12/18/2019   Zoster Recombinat (Shingrix) 11/04/2019, 01/14/2020    Conditions to be addressed/monitored:  Hypertension, Hyperlipidemia, Diabetes, Atrial Fibrillation, Coronary Artery Disease, and GERD  Care Plan : General Pharmacy (Adult)  Updates made by Germaine Pomfret, RPH since 05/14/2022 12:00 AM     Problem: Hypertension, Hyperlipidemia, Diabetes, Atrial Fibrillation, Coronary Artery Disease, and GERD   Priority: High     Long-Range Goal: Patient-Specific Goal   Start Date: 12/07/2021  Expected End Date: 12/07/2022  This Visit's Progress: On track  Recent Progress: On track  Priority: High  Note:   Current Barriers:  No barriers noted   Pharmacist Clinical Goal(s):  Patient will maintain control of diabetes as evidenced by A1c less than 8%  through collaboration with PharmD and provider.   Interventions: 1:1 collaboration with [name], PA-C regarding development and update of comprehensive plan of care as evidenced by provider attestation and co-signature Inter-disciplinary care team collaboration (see longitudinal plan of care) Comprehensive medication review performed; medication list updated in electronic medical record  Hypertension (BP goal <140/90) -Controlled -Current treatment: Metoprolol XL 25 mg daily: Appropriate, Effective, Safe, Accessible Ramipril 5 mg daily: Appropriate, Effective, Safe, Accessible  -Medications previously tried: NA  -Current home readings: Does not have home blood pressure monitor.   -Denies hypotensive/hypertensive symptoms -Recommended to  continue current medication  Atrial Fibrillation (Goal: prevent stroke and major bleeding) -Controlled -CHADSVASC: 5 -Current treatment: Rate control: Metoprolol XL 25 mg daily: Appropriate, Effective, Safe, Accessible  Anticoagulation: Eliquis 5 mg twice daily: Appropriate, Effective, Safe, Query accessible  -Medications previously tried: NA -asymptomatic A-Fib, denies unusual or concerning bleeding.  -Patient concerned regarding cost of Eliquis, states he is currently able to afford medication. Based on patient income patient will likely need to purchase ~9 months of Eliquis in order to meet out of pocket spending costs. Informed patient of this and to inform me if he enters into [facility].  -Recommended to continue current medication  Hyperlipidemia: (LDL goal < 70) -Controlled: Not addressed during this visit. -Current treatment: Rosuvastatin 10 mg daily  -Medications previously tried: NA  -Recommended to continue current medication  Diabetes (A1c goal <8%) -Controlled: Not addressed during this visit. -Current medications: Metformin 500 mg twice daily: Appropriate, Effective, Safe, Accessible -Medications previously tried: NA  -Current home glucose readings fasting glucose: NA -Denies hypoglycemic/hyperglycemic symptoms -Current meal patterns: Eats at [employer], fast food for most meals.  ham biscuit + hasbrowns  OR Hamburger drinks: diet Pepsi 1 bottle daily, otherwise water  -Current exercise: Minimial -Recommended to continue current medication  GERD (Goal: Prevent reflux) -Controlled: Not addressed during this visit. -Current treatment  Pantoprazole 40 mg 1 tablet daily on Mon/Wed/Fri  -Medications previously tried: NA  -Recommended to continue current medication   Patient Goals/Self-Care Activities Patient will:  - check glucose daily before breakfast, document, and provide at future appointments check blood pressure  weekly, document, and provide at future  appointments  Follow Up Plan: Telephone follow up appointment with care management team member scheduled for:  10/22/2022 at 8:30 AM    Medication Assistance:  Will apply for Eliquis patient assistance in 2023 when patient qualifies.  Compliance/Adherence/Medication fill history: Care Gaps: COVID-19 Vaccine Booster 3 Diabetic Foot Exam  Star-Rating Drugs: Metformin 500 mg last filled on 08/31/2021 for a 90-Day supply with [facility] Rosuvastatin 10 mg last filled on 11/01/2021 for a 90-Day supply with [facility] Ramipril 5 mg last filled on 08/31/2021 for a 90-Day supply with [facility]  Patient's preferred pharmacy is:  [id number] - [address], [address] AT [address] [place] [address] Phone: [phone] Fax: [phone]  Uses pill box? Yes Pt endorses 100% compliance  We discussed: Current pharmacy is preferred with insurance plan and patient is satisfied with pharmacy services Patient decided to: Continue current medication management strategy  Care Plan and Follow Up Patient Decision:  Patient agrees to Care Plan and Follow-up.  Plan: Telephone follow up appointment with care management team member scheduled for:  10/22/2022 at 8:30 AM  [name], PharmD, Para March, [place] [phone]

## 2022-05-15 ENCOUNTER — Telehealth: Payer: Self-pay | Admitting: *Deleted

## 2022-05-15 LAB — CBC WITH DIFFERENTIAL/PLATELET
Basophils Absolute: 0.1 10*3/uL (ref 0.0–0.2)
Basos: 1 %
EOS (ABSOLUTE): 0.2 10*3/uL (ref 0.0–0.4)
Eos: 3 %
Hematocrit: 41.5 % (ref 37.5–51.0)
Hemoglobin: 13.1 g/dL (ref 13.0–17.7)
Immature Grans (Abs): 0 10*3/uL (ref 0.0–0.1)
Immature Granulocytes: 0 %
Lymphocytes Absolute: 0.9 10*3/uL (ref 0.7–3.1)
Lymphs: 14 %
MCH: 26.7 pg (ref 26.6–33.0)
MCHC: 31.6 g/dL (ref 31.5–35.7)
MCV: 85 fL (ref 79–97)
Monocytes Absolute: 0.8 10*3/uL (ref 0.1–0.9)
Monocytes: 13 %
Neutrophils Absolute: 4.5 10*3/uL (ref 1.4–7.0)
Neutrophils: 69 %
Platelets: 159 10*3/uL (ref 150–450)
RBC: 4.91 x10E6/uL (ref 4.14–5.80)
RDW: 14.7 % (ref 11.6–15.4)
WBC: 6.4 10*3/uL (ref 3.4–10.8)

## 2022-05-15 LAB — COMPREHENSIVE METABOLIC PANEL
ALT: 16 IU/L (ref 0–44)
AST: 19 IU/L (ref 0–40)
Albumin/Globulin Ratio: 1.3 (ref 1.2–2.2)
Albumin: 4 g/dL (ref 3.6–4.6)
Alkaline Phosphatase: 130 IU/L — ABNORMAL HIGH (ref 44–121)
BUN/Creatinine Ratio: 13 (ref 10–24)
BUN: 11 mg/dL (ref 8–27)
Bilirubin Total: 1.4 mg/dL — ABNORMAL HIGH (ref 0.0–1.2)
CO2: 22 mmol/L (ref 20–29)
Calcium: 8.9 mg/dL (ref 8.6–10.2)
Chloride: 103 mmol/L (ref 96–106)
Creatinine, Ser: 0.84 mg/dL (ref 0.76–1.27)
Globulin, Total: 3 g/dL (ref 1.5–4.5)
Glucose: 160 mg/dL — ABNORMAL HIGH (ref 70–99)
Potassium: 4.7 mmol/L (ref 3.5–5.2)
Sodium: 140 mmol/L (ref 134–144)
Total Protein: 7 g/dL (ref 6.0–8.5)
eGFR: 84 mL/min/{1.73_m2} (ref >59)

## 2022-05-15 LAB — HEMOGLOBIN A1C
Est. average glucose Bld gHb Est-mCnc: 169 mg/dL
Hgb A1c MFr Bld: 7.5 % — ABNORMAL HIGH (ref 4.8–5.6)

## 2022-05-15 NOTE — Chronic Care Management (AMB) (Signed)
  Chronic Care Management   Note  05/15/2022 Name: Charles Sellers. MRN: [id number] DOB: 03-09-1932  Charles Sellers. is a 88 y.o. year old male who is a primary care patient of [name], [place]. Charles Sellers. is currently enrolled in care management services. An additional referral for Licensed Clinical SW was placed.   Follow up plan: Telephone appointment with care management team member scheduled for: 05/17/2022  Julian Hy, [place] Management  Direct Dial: [phone]

## 2022-05-17 ENCOUNTER — Ambulatory Visit: Payer: PPO | Admitting: *Deleted

## 2022-05-17 DIAGNOSIS — E1169 Type 2 diabetes mellitus with other specified complication: Secondary | ICD-10-CM

## 2022-05-17 DIAGNOSIS — I152 Hypertension secondary to endocrine disorders: Secondary | ICD-10-CM

## 2022-05-17 NOTE — Chronic Care Management (AMB) (Signed)
Assessment: Assessed patient's previous and current treatment, coping skills, support system and barriers to care.. No Care Plan was developed during this encounter.  Recent life changes Charles Sellers: Meals on Wheels referral-patient states that he is unable to cook, currently eating a lot of fast food. Patient lives alone, his driving is limited. Patient requesting Meals on Wheels if possible  Interventions:Needs Assessment completed, Depression screen reviewed  Solution-Focused Strategies employed:   Review various resources, discussed options and provided patient information about  Meals on wheels (referral completed 05/17/22) Patient place on waiting list and will be contacted once his name comes up  Recommendation: Patient may benefit from, and is in agreement to referral for Meals on Wheels which was completed today  Follow up Plan: No follow up scheduled with CCM LCSW at this time. Patient will call office if needed.

## 2022-05-17 NOTE — Patient Instructions (Signed)
Visit Information  Thank you for taking time to visit with me today. Please don't hesitate to contact me if I can be of assistance to you before our next scheduled telephone appointment.  Following are the goals we discussed today:  - call 211 when I need some help - follow-up on any referrals for help I am given - think ahead to make sure my need does not become an emergency - make a list of family or friends that I can call  If you are experiencing a Mental Health or Economy or need someone to talk to, please call the Suicide and Crisis Lifeline: 988   Following is a copy of your full plan of care:  There are no care plans that you recently modified to display for this patient.   Charles Sellers was given information about Care Management services by the embedded care coordination team including:  Care Management services include personalized support from designated clinical staff supervised by his physician, including individualized plan of care and coordination with other care providers 24/7 contact phone numbers for assistance for urgent and routine care needs. The patient may stop CCM services at any time (effective at the end of the month) by phone call to the office staff.  Patient agreed to services and verbal consent obtained.   Patient verbalizes understanding of instructions and care plan provided today and agrees to view in [place]. Active MyChart status and patient understanding of how to access instructions and care plan via MyChart confirmed with patient.     No further follow up required: Meals on Wheels referral completed, patient placed on waiting list   Elliot Gurney, [place] Worker  [facility] Care Management [phone]

## 2022-05-18 ENCOUNTER — Ambulatory Visit: Payer: PPO | Admitting: Cardiology

## 2022-05-24 ENCOUNTER — Ambulatory Visit: Payer: PPO | Admitting: Cardiology

## 2022-05-24 ENCOUNTER — Encounter: Payer: Self-pay | Admitting: Podiatry

## 2022-05-24 ENCOUNTER — Ambulatory Visit: Payer: PPO | Admitting: Podiatry

## 2022-05-24 ENCOUNTER — Encounter: Payer: Self-pay | Admitting: Cardiology

## 2022-05-24 VITALS — BP 132/78 | HR 100 | Ht 66.0 in | Wt 163.2 lb

## 2022-05-24 DIAGNOSIS — B351 Tinea unguium: Secondary | ICD-10-CM | POA: Diagnosis not present

## 2022-05-24 DIAGNOSIS — I340 Nonrheumatic mitral (valve) insufficiency: Secondary | ICD-10-CM | POA: Diagnosis not present

## 2022-05-24 DIAGNOSIS — M79674 Pain in right toe(s): Secondary | ICD-10-CM | POA: Diagnosis not present

## 2022-05-24 DIAGNOSIS — I350 Nonrheumatic aortic (valve) stenosis: Secondary | ICD-10-CM | POA: Diagnosis not present

## 2022-05-24 DIAGNOSIS — M79675 Pain in left toe(s): Secondary | ICD-10-CM | POA: Diagnosis not present

## 2022-05-24 DIAGNOSIS — R5383 Other fatigue: Secondary | ICD-10-CM

## 2022-05-24 DIAGNOSIS — E1122 Type 2 diabetes mellitus with diabetic chronic kidney disease: Secondary | ICD-10-CM

## 2022-05-24 DIAGNOSIS — I2510 Atherosclerotic heart disease of native coronary artery without angina pectoris: Secondary | ICD-10-CM | POA: Diagnosis not present

## 2022-05-24 DIAGNOSIS — Z9861 Coronary angioplasty status: Secondary | ICD-10-CM

## 2022-05-24 DIAGNOSIS — I152 Hypertension secondary to endocrine disorders: Secondary | ICD-10-CM

## 2022-05-24 DIAGNOSIS — E785 Hyperlipidemia, unspecified: Secondary | ICD-10-CM

## 2022-05-24 DIAGNOSIS — E1169 Type 2 diabetes mellitus with other specified complication: Secondary | ICD-10-CM

## 2022-05-24 DIAGNOSIS — E1159 Type 2 diabetes mellitus with other circulatory complications: Secondary | ICD-10-CM

## 2022-05-24 DIAGNOSIS — I4821 Permanent atrial fibrillation: Secondary | ICD-10-CM

## 2022-05-24 DIAGNOSIS — N181 Chronic kidney disease, stage 1: Secondary | ICD-10-CM

## 2022-05-24 MED ORDER — FUROSEMIDE 20 MG PO TABS: ORAL_TABLET | ORAL | 3 refills | Status: DC

## 2022-05-24 MED ORDER — RAMIPRIL 5 MG PO CAPS: 5.0000 mg | ORAL_CAPSULE | Freq: Every day | ORAL | 3 refills | Status: DC

## 2022-05-24 MED ORDER — RAMIPRIL 2.5 MG PO CAPS: 2.5000 mg | ORAL_CAPSULE | Freq: Every day | ORAL | 3 refills | Status: DC

## 2022-05-24 NOTE — Progress Notes (Signed)
This patient returns to my office for at risk foot care.  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  Patient is taking plavix.  This patient is unable to cut nails himself since the patient cannot reach his nails.These nails are painful walking and wearing shoes.  This patient presents for at risk foot care today.  General Appearance  Alert, conversant and in no acute stress.  Vascular  Dorsalis pedis and posterior tibial  pulses are palpable  bilaterally.  Capillary return is within normal limits  bilaterally. Temperature is within normal limits  bilaterally.  Neurologic  Senn-Weinstein monofilament wire test within normal limits  bilaterally. Muscle power within normal limits bilaterally.  Nails Thick disfigured discolored nails with subungual debris  from hallux to fifth toes bilaterally. No evidence of bacterial infection or drainage bilaterally.  Orthopedic  No limitations of motion  feet .  No crepitus or effusions noted.  No bony pathology or digital deformities noted.  Skin  normotropic skin with no porokeratosis noted bilaterally.  No signs of infections or ulcers noted.     Onychomycosis  Pain in right toes  Pain in left toes  Consent was obtained for treatment procedures.   Mechanical debridement of nails 1-5  bilaterally performed with a nail nipper.  Filed with dremel without incident.    Return office visit   10 weeks                 Told patient to return for periodic foot care and evaluation due to potential at risk complications.   Gardiner Barefoot DPM

## 2022-05-24 NOTE — Patient Instructions (Signed)
Medication Instructions:  Your physician has recommended you make the following change in your medication:   INCREASE Altace to 7.5 mg daily   START lasix 20 mg two times per week. May take daily as needed for swelling.   *If you need a refill on your cardiac medications before your next appointment, please call your pharmacy*   Lab Work: None ordered  If you have labs (blood work) drawn today and your tests are completely normal, you will receive your results only by: [place] (if you have MyChart) OR A paper copy in the mail If you have any lab test that is abnormal or we need to change your treatment, we will call you to review the results.   Testing/Procedures: None ordered   Follow-Up: At [facility], you and your health needs are our priority.  As part of our continuing mission to provide you with exceptional heart care, we have created designated Provider Care Teams.  These Care Teams include your primary Cardiologist (physician) and Advanced Practice Providers (APPs -  Physician Assistants and Nurse Practitioners) who all work together to provide you with the care you need, when you need it.  We recommend signing up for the patient portal called "MyChart".  Sign up information is provided on this After Visit Summary.  MyChart is used to connect with patients for Virtual Visits (Telemedicine).  Patients are able to view lab/test results, encounter notes, upcoming appointments, etc.  Non-urgent messages can be sent to your provider as well.   To learn more about what you can do with MyChart, go to [email or web address].    Your next appointment:   As scheduled  The format for your next appointment:   In Person  Provider:   You may see Glenetta Hew, MD or one of the following Advanced Practice Providers on your designated Care Team:   Murray Hodgkins, NP Christell Faith, PA-C Cadence Kathlen Mody, [place]    Other Instructions N/A  Important Information About  [place]

## 2022-05-24 NOTE — Progress Notes (Unsigned)
Primary Care Provider: [name], PA-C Cardiologist: Glenetta Hew, MD Electrophysiologist: None  Clinic Note: No chief complaint on file.   ===================================  ASSESSMENT/PLAN   Problem List Items Addressed This Visit       Cardiology Problems   Permanent atrial fibrillation ([place]); CHA2DS2-VASc score 5 - Primary (Chronic)   Hyperlipidemia associated with type 2 diabetes mellitus ([place]) (Chronic)   Hypertension associated with diabetes ([place]) (Chronic)   CAD S/P percutaneous coronary angioplasty (Chronic)   Mitral regurgitation (Chronic)   Mild aortic stenosis (Chronic)    ===================================  HPI:    Charles Sellers. is a 88 y.o. male with a PMH below who presents today for 87-monthfollow-up to discuss edema    Charles Sellers a former patient of Dr. RTerance Icewith CAD and moderate mitral regurgitation. December 2005: Staged two-vessel DES PCI --> initial PCI of the mid RCA with a 3.5 x 18 mm Cypher DES (-> 4.0 mm),  RPL - Cypher DES 2.5 mm x 13 mm  Staged PCI of the LAD at D2 with a Cypher DES 3.0 mm 30 mm (-> 3.535m w/ provisional PTCA of D2 ostium 2/2 plaque shift.  CTO dCx ==> Med Rx.  Follow-up Myoviews (most recent June 2013) --> nonischemic.  Small basal inferior infarct. TTE 08/07/2018: Normal LV function EF 55 to 60%.  No R WMA.  GR 1 DD.  Mild aortic stenosis with trivial regurgitation.  Moderate mitral regurgitation with mildly dilated left atrium.  Charles Umscheidwas last seen on October 16, 2021-was doing well.  His wife passed away in [place]06[place]04-2022hen he was somewhat upset.  Slowly learning to live alone, but does have family close by.  He noted that he was having a little bit of leg weakness with more activity.  No sense of A-fib.  No anginal symptoms.  Sleeps on a wedge pillow because of comfort but no PND or orthopnea.  No significant edema.  Had weight loss because of not eating and drinking as much since his wife  died.   Follow-up 2D echo ordered to reassess mild aortic stenosis, mitral regurgitation and overall EF.  Recent Hospitalizations:  Fall with injury to the knee/knee effusion-March 16, 2022 Nurse call 05/02/2022: Daughter called noting that her father is going downhill fast.  Wanted referral to program this.  [facility] => Noted that husband is not doing as well.  Worsening edema and dyspnea. [facility] 05/18/2022 with atypical chest pain.  Symptoms thought to be related to indigestion.  EKG and echo troponin level normal.  Recently seen by PCP 05/14/2022: Patient evaluated for excessive fatigue and sleepiness.  Does not sleep well at night.  Discussed sleep hygiene.   Reviewed  CV studies:    The following studies were reviewed today: (if available, images/films reviewed: From Epic Chart or Care Everywhere) Echo ordered for October, pending:   Interval History:   Charles Sellers  CV Review of Symptoms (Summary) Cardiovascular ROS: {roscv:310661}  REVIEWED OF SYSTEMS   ROS  Genitourinary:  Positive for frequency (Frequent nocturia). Negative for hematuria.  Musculoskeletal:  Positive for falls (Loss of balance.) and joint pain. Negative for myalgias.  Neurological:  Positive for dizziness and weakness (Leg weakness.). Negative for headaches.  Psychiatric/Behavioral:  Positive for memory loss  I have reviewed and (if needed) personally updated the patient's problem list, medications, allergies, past medical and surgical history, social and family history.   PAST MEDICAL  HISTORY   Past Medical History:  Diagnosis Date   Atherosclerosis    CAD (coronary artery disease) 2011 & 2009   Echo, EF =>55% 2011/ Echo, EF =>55% 2009   CAD S/P percutaneous coronary angioplasty 12/13 & 15 2005   PCI - mRCA (Cypher DES 3.5 mm x 18 mm --> 4.50m); RPL Cypher 2.5 mm x 13 mm;; mLAD Cy a pher DES 3.0 mm x 13 mm (3.5 mm) with Cutting PTCA of D2 ostium;; mid Cx  100% after small OM1.   Cholecystitis    Dermatitis    Diabetes mellitus type II, controlled ([place]    Diet controlled. No medications; he indicates "borderline diabetes"   Dyslipidemia, goal LDL below 70     on statin   Hypertension    Leaky heart valve    Prostate nodule    Ventral hernia     PAST SURGICAL HISTORY   Past Surgical History:  Procedure Laterality Date   APPENDECTOMY     CARDIAC CATHETERIZATION  12/11/2003   Carotid Dopplers  08/10/2013   Bilateral internal carotids < 49%.   CORONARY ANGIOPLASTY WITH STENT PLACEMENT     mRCA Cypher DES 3.5 mm x 18 mm (4.0), RPL Cypher DES 2.5 mm x 13 mm; mLAD 3.0 mm x 13 mm Cypher -> cutting PTCA of jailed D2; previousl PCI of Cx - 100% mid Cx.   NM MYOVIEW LTD  06/09/2012   Small area of basal inferior infarct with no ischemia. Normal EF   Stress Myoview  12/11/2011   normal findings   TRANSTHORACIC ECHOCARDIOGRAM  01/10/2013    EF 55%. Mild-moderate MR   TRANSTHORACIC ECHOCARDIOGRAM  11/09/2020   Normal EF 55 to 60%.  Moderate MR with moderate LA dilation.  Mild aortic sclerosis with no stenosis.  Normal RV, RVP and RAP.    Immunization History  Administered Date(s) Administered   Fluad Quad(high Dose 65+) 10/20/2019, 09/01/2020   Influenza, High Dose Seasonal PF 10/18/2017, 10/24/2018   PFIZER(Purple Top)SARS-COV-2 Vaccination 10/13/2020, 11/15/2020   Pneumococcal Conjugate-13 01/09/2016   Pneumococcal Polysaccharide-23 08/04/2008   Tdap 01/09/2016, 12/18/2019   Zoster Recombinat (Shingrix) 11/04/2019, 01/14/2020    MEDICATIONS/ALLERGIES   No outpatient medications have been marked as taking for the 05/24/22 encounter (Appointment) with HLeonie Man MD.    Allergies  Allergen Reactions   Niaspan [Durene CalEr] Rash   Penicillins Rash    SOCIAL HISTORY/FAMILY HISTORY   Reviewed in Epic:  Pertinent findings:  Social History   Tobacco Use   Smoking status: Never   Smokeless tobacco: Never  Vaping Use    Vaping Use: Never used  Substance Use Topics   Alcohol use: No    Alcohol/week: 0.0 standard drinks of alcohol   Drug use: No   Social History   Social History Narrative   Widowed father of 3, 9 grandchildren, now 94great-grandchildren. 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 Always on the go.   Never smoked. Does not drink alcohol.    OBJCTIVE -PE, EKG, labs   Wt Readings from Last 3 Encounters:  05/14/22 171 lb 11.2 oz (77.9 kg)  03/16/22 169 lb 12.4 oz (77 kg)  03/16/22 171 lb 4.8 oz (77.7 kg)    Physical Exam: There  were no vitals taken for this visit. Physical Exam     Rate and Rhythm: Normal rate. Rhythm irregularly irregular. No extrasystoles are present.    Chest Wall: PMI is not displaced.     Pulses: Normal pulses.     Heart sounds: Heart sounds are distant. Murmur heard.  Harsh crescendo-decrescendo early systolic murmur is present with a grade of 1/6 at the upper right sternal border radiating to the neck.  High-pitched blowing holosystolic murmur of grade 1/6 is also present at the apex.    No friction rub. No gallop.     Comments: Normal S1 but likely split S2. Slow, wide-based gait   Pulmonary:    Adult ECG Report  Rate: *** ;  Rhythm: {rhythm:17366};   Narrative Interpretation: ***  Recent Labs:  ***  Lab Results  Component Value Date   CHOL 122 06/19/2021   HDL 36 (L) 06/19/2021   LDLCALC 74 06/19/2021   TRIG 52 06/19/2021   CHOLHDL 3.4 06/19/2021   Lab Results  Component Value Date   CREATININE 0.84 05/14/2022   BUN 11 05/14/2022   NA 140 05/14/2022   K 4.7 05/14/2022   CL 103 05/14/2022   CO2 22 05/14/2022      Latest Ref Rng & Units 05/14/2022   10:45 AM 06/19/2021   11:35 AM 12/05/2020   10:59 AM  CBC  WBC 3.4 - 10.8 x10E3/uL 6.4  7.2  7.7    Hemoglobin 13.0 - 17.7 g/dL 13.1  14.9  15.5   Hematocrit 37.5 - 51.0 % 41.5  44.3  45.5   Platelets 150 - 450 x10E3/uL 159  167  169     Lab Results  Component Value Date   HGBA1C 7.5 (H) 05/14/2022   Lab Results  Component Value Date   TSH 1.450 06/19/2021    ==================================================  COVID-19 Education: The signs and symptoms of COVID-19 were discussed with the patient and how to seek care for testing (follow up with PCP or arrange E-visit).    I spent a total of ***minutes with the patient spent in direct patient consultation.  Additional time spent with chart review  / charting (studies, outside notes, etc): *** min Total Time: *** min  Current medicines are reviewed at length with the patient today.  (+/- concerns) ***  This visit occurred during the SARS-CoV-2 public health emergency.  Safety protocols were in place, including screening questions prior to the visit, additional usage of staff PPE, and extensive cleaning of exam room while observing appropriate contact time as indicated for disinfecting solutions.  Notice: This dictation was prepared with Dragon dictation along with smart phrase technology. Any transcriptional errors that result from this process are unintentional and may not be corrected upon review.  Studies Ordered:  No orders of the defined types were placed in this encounter.  No orders of the defined types were placed in this encounter.   Patient Instructions / Medication Changes & Studies & Tests Ordered   There are no Patient Instructions on file for this visit.      Glenetta Hew, M.D., M.S. Interventional Cardiologist   Pager # [phone] Phone # [phone] [address] [place], [place] [address]   Thank you for choosing [facility] in [place]!!

## 2022-05-25 ENCOUNTER — Ambulatory Visit (INDEPENDENT_AMBULATORY_CARE_PROVIDER_SITE_OTHER): Payer: PPO | Admitting: Podiatry

## 2022-05-25 ENCOUNTER — Telehealth: Payer: Self-pay

## 2022-05-25 DIAGNOSIS — M79674 Pain in right toe(s): Secondary | ICD-10-CM

## 2022-05-25 DIAGNOSIS — B351 Tinea unguium: Secondary | ICD-10-CM

## 2022-05-25 DIAGNOSIS — M79675 Pain in left toe(s): Secondary | ICD-10-CM

## 2022-05-25 NOTE — Addendum Note (Signed)
Addended by: Edrick Kins on: 05/25/2022 03:44 PM   Modules accepted: Level of Service

## 2022-05-25 NOTE — Telephone Encounter (Signed)
Patient was seen yesterday in the [place] office for diabetic foot care. Patient was not happy with care received. Stating all 10 nails were not trimmed and some are rough because they were not filed. Patient states provider seemed to be upset when he complained about the service. Patient will be coming in today to see a different provider to exam the toenails and trim as needed. No charge to the patient.

## 2022-05-25 NOTE — Telephone Encounter (Signed)
Saw patient today.  Thanks, Dr. Amalia Hailey

## 2022-05-25 NOTE — Progress Notes (Signed)
   SUBJECTIVE Patient with a history of diabetes mellitus presents to office today complaining of elongated, thickened nails that cause pain while ambulating in shoes.  Patient came into the office yesterday, 05/24/2022, for routine nail debridement performed by Dr. Prudence Davidson but he was unsatisfied with the debridement.  He made an appointment for today.  Patient is unable to trim their own nails. Patient is here for further evaluation and treatment.   Past Medical History:  Diagnosis Date   Atherosclerosis    CAD (coronary artery disease) 2011 & 2009   Echo, EF =>55% 2011/ Echo, EF =>55% 2009   CAD S/P percutaneous coronary angioplasty 12/13 & 15 2005   PCI - mRCA (Cypher DES 3.5 mm x 18 mm --> 4.60m); RPL Cypher 2.5 mm x 13 mm;; mLAD Cy a pher DES 3.0 mm x 13 mm (3.5 mm) with Cutting PTCA of D2 ostium;; mid Cx 100% after small OM1.   Cholecystitis    Dermatitis    Diabetes mellitus type II, controlled ([place]    Diet controlled. No medications; he indicates "borderline diabetes"   Dyslipidemia, goal LDL below 70     on statin   Hypertension    Leaky heart valve    Prostate nodule    Ventral hernia     OBJECTIVE General Patient is awake, alert, and oriented x 3 and in no acute distress. Derm Skin is dry and supple bilateral. Negative open lesions or macerations. Remaining integument unremarkable. Nails are tender, long, thickened and dystrophic with subungual debris, consistent with onychomycosis, 1-5 bilateral. No signs of infection noted. Vasc  DP and PT pedal pulses palpable bilaterally. Temperature gradient within normal limits.  Neuro Epicritic and protective threshold sensation diminished bilaterally.  Musculoskeletal Exam No symptomatic pedal deformities noted bilateral. Muscular strength within normal limits.  ASSESSMENT 1. Diabetes Mellitus w/ peripheral neuropathy 2.  Pain due to onychomycosis of toenails bilateral  PLAN OF CARE 1. Patient evaluated today. 2. Instructed to  maintain good pedal hygiene and foot care. Stressed importance of controlling blood sugar.  3. Mechanical debridement of nails 1-5 bilaterally performed using a nail nipper as a courtesy for the patient. Filed with dremel without incident.  4. Return to clinic in 3 mos.     BEdrick Kins DPM [facility]  Dr. BEdrick Kins DPM    [address] [place] [place] [address]               Office [phone] Fax [phone]

## 2022-05-26 ENCOUNTER — Encounter: Payer: Self-pay | Admitting: Cardiology

## 2022-05-26 NOTE — Assessment & Plan Note (Signed)
A-fib rate is little high today, but has had some issues with bradycardia as well.  As such, would be reluctant to increase his beta-blocker at this point.  Currently on 25 mg Toprol for rate control along with Eliquis for DOAC.  Has voiced financial concerns about DOAC.  Looking into assistance plans.

## 2022-05-26 NOTE — Assessment & Plan Note (Signed)
Occluded circumflex coronary stents in the RCA, RPL and LAD.  Myoview was in 2013.  Nonischemic.  No active anginal symptoms.   Plan:  Unless the echo shows reduction in EF, would probably not proceed with additional ischemic evaluation at this time.  Continue low-dose beta-blocker and ACE inhibitor with plans to increase dose.  Continue statin at current dose  No longer on a Thienopyridine as he is on Eliquis.

## 2022-05-26 NOTE — Assessment & Plan Note (Signed)
No significant AR/no previous echo, but has planned follow-up echo to evaluate MR.

## 2022-05-26 NOTE — Assessment & Plan Note (Signed)
Not exactly sure what the cause for his fatigue is.  Probably not getting great sleep I do agree with sleep arching.  He is not anemic.  Would not titrate beta-blocker or statin any further to avoid potential exacerbation.   I think some of his polysomnogram may be partly related to some morning/depression now following the death of his wife.

## 2022-05-26 NOTE — Assessment & Plan Note (Addendum)
Remains on statin.  Should be due for labs recheck soon.  Previously relatively well controlled but not quite at goal as of July 2022.  Given his advanced age, I would be leery of being more aggressive with statin.  On metformin 1000 Owen twice daily for diabetes, could consider SGLT2 inhibitor such as [place] or Jardiance to assist with mild edema with known history of CAD and most likely HFpEF

## 2022-05-26 NOTE — Assessment & Plan Note (Signed)
Moderate MR noted on previous echo.  Murmur is not overly significant, but with his now increasing dyspnea and edema we will recheck echocardiogram sooner than planned.  Was scheduled for October, will order for now. Would be easier for him to have it checked here in [place] so we will therefore change it to [place].

## 2022-05-26 NOTE — Assessment & Plan Note (Signed)
Blood pressure is pretty well controlled, but with him now having symptoms of CHF with dyspnea and edema as well as orthopnea, will add additional 2.5 mg of ramipril for more afterload reduction. . We will also add low-dose Lasix 20 mg which she will take for 3 days and then 2 days a week plus PRN (weight gain or edema) on other days.Charles Sellers

## 2022-06-04 ENCOUNTER — Other Ambulatory Visit: Payer: Self-pay

## 2022-06-04 ENCOUNTER — Telehealth: Payer: Self-pay

## 2022-06-04 ENCOUNTER — Telehealth: Payer: Self-pay | Admitting: Physician Assistant

## 2022-06-04 DIAGNOSIS — E1122 Type 2 diabetes mellitus with diabetic chronic kidney disease: Secondary | ICD-10-CM

## 2022-06-04 NOTE — Telephone Encounter (Signed)
[facility] faxed refill request for the following medications:   metFORMIN (GLUCOPHAGE) 500 MG tablet    Please advise.  

## 2022-06-05 MED ORDER — METFORMIN HCL 500 MG PO TABS: 1000.0000 mg | ORAL_TABLET | Freq: Two times a day (BID) | ORAL | 1 refills | Status: DC

## 2022-06-06 ENCOUNTER — Other Ambulatory Visit: Payer: Self-pay | Admitting: Physician Assistant

## 2022-06-06 DIAGNOSIS — E1122 Type 2 diabetes mellitus with diabetic chronic kidney disease: Secondary | ICD-10-CM

## 2022-06-06 MED ORDER — METFORMIN HCL 500 MG PO TABS: 1000.0000 mg | ORAL_TABLET | Freq: Two times a day (BID) | ORAL | 1 refills | Status: DC

## 2022-06-06 NOTE — Telephone Encounter (Signed)
sent 

## 2022-06-08 DIAGNOSIS — Z7984 Long term (current) use of oral hypoglycemic drugs: Secondary | ICD-10-CM

## 2022-06-08 DIAGNOSIS — E785 Hyperlipidemia, unspecified: Secondary | ICD-10-CM | POA: Diagnosis not present

## 2022-06-08 DIAGNOSIS — I4821 Permanent atrial fibrillation: Secondary | ICD-10-CM | POA: Diagnosis not present

## 2022-06-08 DIAGNOSIS — E1159 Type 2 diabetes mellitus with other circulatory complications: Secondary | ICD-10-CM | POA: Diagnosis not present

## 2022-06-13 ENCOUNTER — Ambulatory Visit (INDEPENDENT_AMBULATORY_CARE_PROVIDER_SITE_OTHER): Payer: PPO

## 2022-06-13 DIAGNOSIS — I4821 Permanent atrial fibrillation: Secondary | ICD-10-CM | POA: Diagnosis not present

## 2022-06-13 DIAGNOSIS — I50810 Right heart failure, unspecified: Secondary | ICD-10-CM

## 2022-06-13 DIAGNOSIS — I350 Nonrheumatic aortic (valve) stenosis: Secondary | ICD-10-CM

## 2022-06-13 HISTORY — DX: Right heart failure, unspecified: I50.810

## 2022-06-13 LAB — ECHOCARDIOGRAM COMPLETE
AR max vel: 1.35 cm2
AV Area VTI: 1.54 cm2
AV Area mean vel: 1.53 cm2
AV Mean grad: 5.5 mmHg
AV Peak grad: 11.9 mmHg
Ao pk vel: 1.73 m/s
Calc EF: 43.5 %
S' Lateral: 4.5 cm
Single Plane A2C EF: 41.5 %
Single Plane A4C EF: 46.8 %

## 2022-07-13 ENCOUNTER — Ambulatory Visit: Payer: Self-pay | Admitting: *Deleted

## 2022-07-13 NOTE — Telephone Encounter (Signed)
  Chief Complaint: fall Symptoms: back/chest pain- mild, laceration of arm-left - large scrape,hit back of head Frequency: chest/back pain-soreness, arm laceration Pertinent Negatives: Patient denies active bleeding, swelling, bruising  Disposition: '[]'$ ED /'[x]'$ Urgent Care (no appt availability in office) / '[]'$ Appointment(In office/virtual)/ '[]'$  [place] [facility]/ '[]'$ Home Care/ '[]'$ Refused Recommended Disposition /'[]'$ [place] [facility]/ '[]'$  Follow-up with PCP Additional Notes: Patient advised UC- late in day call- no open appointment  Reason for Disposition  [1] High-risk adult (e.g., age > 60 years, osteoporosis, chronic steroid use) AND [2] MILD to MODERATE pain  Answer Assessment - Initial Assessment Questions 1. MECHANISM: "How did the injury happen?"     Missed hand rail- slipped down the stairs- hit head injured arm 2. ONSET: "When did the injury happen?" (Minutes or hours ago)      Last night- 9:30 3. LOCATION: "Where is the injury located?" "Which arm?"     Left arm 4. APPEARANCE of INJURY: "What does the injury look like?"      Lacerations of arm 5. SEVERITY: "Can you use the arm normally?"      yes 6. SWELLING or BRUISING: "is there any swelling or bruising?" If Yes, ask: "How large is it? (e.g., inches, centimeters)      No swelling-bruising 7. PAIN: "Is there pain?" If Yes, ask: "How bad is the pain?"    (Scale 1-10; or mild, moderate, severe)   - NONE (0): No pain.   - MILD (1-3): Doesn't interfere with normal activities.   - MODERATE (4-7): Interferes with normal activities (e.g., work or school) or awakens from sleep.   - SEVERE (8-10): Excruciating pain, unable to do any normal activities, unable to hold a cup of water.     none 8. TETANUS: For any breaks in the skin, ask: "When was the last tetanus booster?"     Yes, un to date 9. OTHER SYMPTOMS: "Do you have any other symptoms?"  (e.g., numbness in hand)     Sore- chest/back, back 10. PREGNANCY: "Is there any chance  you are pregnant?" "When was your last menstrual period?"  Protocols used: Arm Injury-A-AH

## 2022-08-09 ENCOUNTER — Encounter: Payer: Self-pay | Admitting: Podiatry

## 2022-08-09 ENCOUNTER — Ambulatory Visit: Payer: PPO | Admitting: Podiatry

## 2022-08-09 DIAGNOSIS — B351 Tinea unguium: Secondary | ICD-10-CM | POA: Diagnosis not present

## 2022-08-09 DIAGNOSIS — M79675 Pain in left toe(s): Secondary | ICD-10-CM

## 2022-08-09 DIAGNOSIS — D689 Coagulation defect, unspecified: Secondary | ICD-10-CM

## 2022-08-09 DIAGNOSIS — E1122 Type 2 diabetes mellitus with diabetic chronic kidney disease: Secondary | ICD-10-CM | POA: Diagnosis not present

## 2022-08-09 DIAGNOSIS — N181 Chronic kidney disease, stage 1: Secondary | ICD-10-CM

## 2022-08-09 DIAGNOSIS — M79674 Pain in right toe(s): Secondary | ICD-10-CM

## 2022-08-09 NOTE — Progress Notes (Signed)
This patient returns to my office for at risk foot care.  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  Patient is taking plavix.  This patient is unable to cut nails himself since the patient cannot reach his nails.These nails are painful walking and wearing shoes.  This patient presents for at risk foot care today.  General Appearance  Alert, conversant and in no acute stress.  Vascular  Dorsalis pedis and posterior tibial  pulses are palpable  bilaterally.  Capillary return is within normal limits  bilaterally. Temperature is within normal limits  bilaterally.  Neurologic  Senn-Weinstein monofilament wire test within normal limits  bilaterally. Muscle power within normal limits bilaterally.  Nails Thick disfigured discolored nails with subungual debris  from hallux to fifth toes bilaterally. No evidence of bacterial infection or drainage bilaterally.  Orthopedic  No limitations of motion  feet .  No crepitus or effusions noted.  No bony pathology or digital deformities noted.  Skin  normotropic skin with no porokeratosis noted bilaterally.  No signs of infections or ulcers noted.     Onychomycosis  Pain in right toes  Pain in left toes  Consent was obtained for treatment procedures.   Mechanical debridement of nails 1-5  bilaterally performed with a nail nipper.  Filed with dremel without incident.  Patient was not pleased with my services last visit.  Today he said they look good and said I did a good job.     Return office visit   10 weeks                 Told patient to return for periodic foot care and evaluation due to potential at risk complications.   Gardiner Barefoot DPM

## 2022-08-14 ENCOUNTER — Ambulatory Visit (INDEPENDENT_AMBULATORY_CARE_PROVIDER_SITE_OTHER): Payer: PPO | Admitting: Physician Assistant

## 2022-08-14 ENCOUNTER — Encounter: Payer: Self-pay | Admitting: Physician Assistant

## 2022-08-14 VITALS — BP 125/77 | HR 92 | Ht 66.0 in | Wt 163.5 lb

## 2022-08-14 DIAGNOSIS — R296 Repeated falls: Secondary | ICD-10-CM | POA: Diagnosis not present

## 2022-08-14 DIAGNOSIS — E1121 Type 2 diabetes mellitus with diabetic nephropathy: Secondary | ICD-10-CM

## 2022-08-14 LAB — POCT GLYCOSYLATED HEMOGLOBIN (HGB A1C): Hemoglobin A1C: 6.3 % — AB (ref 4.0–5.6)

## 2022-08-14 MED ORDER — ONETOUCH ULTRA VI STRP: ORAL_STRIP | 2 refills | Status: DC

## 2022-08-14 NOTE — Progress Notes (Signed)
I,[name],acting as a Education administrator for [employer], PA-C.,have documented all relevant documentation on the behalf of Charles Kirschner, PA-C,as directed by  Charles Kirschner, PA-C while in the presence of Charles Kirschner, PA-C.   Established patient visit   Patient: Charles Sellers.   DOB: 1932-07-10   88 y.o. Male  MRN: [id number] Visit Date: 08/14/2022  Today's healthcare provider: [name], PA-C   Chief Complaint  Patient presents with   Diabetes   Follow-up   Fall   Subjective    HPI  Pt is accompanied by daughter today.   Frequent Falls -Reports 3-4 since May.  Pt reports decreased balance, weakness in right leg. Has cane but does not use frequently. All falls have been mechanical-- bending over,unable to retain balance. Tripping over curb. Missing handrail. One fall 4/7 that I saw pt for he went to ED for head CT, negative. Other falls seen by urgent care or not at all.   Denies any current injury or pain.   Daughter concerned about speech slowing, increased falls, weakness. Diabetes Mellitus Type II, Follow-up  Lab Results  Component Value Date   HGBA1C 6.3 (A) 08/14/2022   HGBA1C 7.5 (H) 05/14/2022   HGBA1C 6.9 (A) 01/08/2022   Wt Readings from Last 3 Encounters:  08/14/22 163 lb 8 oz (74.2 kg)  05/24/22 163 lb 4 oz (74 kg)  05/14/22 171 lb 11.2 oz (77.9 kg)   Last seen for diabetes 3 months ago.  Management since then includes no changes made. He reports excellent compliance with treatment. He is not having side effects.  Symptoms: No fatigue No foot ulcerations  No appetite changes No nausea  No paresthesia of the feet  No polydipsia  No polyuria No visual disturbances   No vomiting     Home blood sugar records:  not being checked. No strips  Episodes of hypoglycemia? No    Current insulin regiment: none Most Recent Eye Exam: December 2022 Current exercise: yard work Current diet habits: well balanced  Pertinent Labs: Lab Results   Component Value Date   CHOL 122 06/19/2021   HDL 36 (L) 06/19/2021   LDLCALC 74 06/19/2021   TRIG 52 06/19/2021   CHOLHDL 3.4 06/19/2021   Lab Results  Component Value Date   NA 140 05/14/2022   K 4.7 05/14/2022   CREATININE 0.84 05/14/2022   EGFR 84 05/14/2022   MICROALBUR 50 01/02/2018   LABMICR 96.5 05/30/2020     ---------------------------------------------------------------------------------------------------   Medications: Outpatient Medications Prior to Visit  Medication Sig   chlorhexidine (PERIDEX) 0.12 % solution SMARTSIG:By Mouth   ELIQUIS 5 MG TABS tablet TAKE 1 TABLET(5 MG) BY MOUTH TWICE DAILY   furosemide (LASIX) 20 MG tablet Take 1 tablet 2 days per week. May take as needed for swelling.   LIFESCAN FINEPOINT LANCETS MISC Test fasting blood sugar once each morning.   metFORMIN (GLUCOPHAGE) 500 MG tablet Take 2 tablets (1,000 mg total) by mouth 2 (two) times daily with a meal.   metoprolol succinate (TOPROL-XL) 25 MG 24 hr tablet Take 1 tablet (25 mg total) by mouth daily.   Multiple Vitamins-Minerals (ICAPS AREDS 2 PO) Take 1 capsule by mouth daily.   Multiple Vitamins-Minerals (PRESERVISION AREDS 2) CHEW Chew by mouth.   naproxen sodium (ANAPROX) 220 MG tablet Take 220 mg by mouth daily as needed.   nitroGLYCERIN (NITROSTAT) 0.4 MG SL tablet Place 1 tablet (0.4 mg total) under the tongue every 5 (five) minutes as  needed for chest pain.   Omega-3 Fatty Acids (FISH OIL) 1000 MG CAPS Take 1,000 mg by mouth daily.   pantoprazole (PROTONIX) 40 MG tablet TAKE 1 TABLET BY MOUTH ON MONDAY, WEDNESDAY AND FRIDAY OF EACH WEEK   ramipril (ALTACE) 2.5 MG capsule Take 1 capsule (2.5 mg total) by mouth daily. Take with 51m capsule for 7.579mtotal.   ramipril (ALTACE) 5 MG capsule Take 1 capsule (5 mg total) by mouth daily. Take with 2.5 mg capsule for 7.35m735motal.   rosuvastatin (CRESTOR) 10 MG tablet TAKE 1 TABLET(10 MG) BY MOUTH DAILY   [DISCONTINUED] glucose blood (ONETOUCH  ULTRA) test strip USE TO TEST BLOOD SUGAR EVERY DAY AS DIRECTED   No facility-administered medications prior to visit.    Review of Systems  Constitutional:  Negative for fatigue and fever.  Respiratory:  Negative for cough and shortness of breath.   Cardiovascular:  Negative for chest pain, palpitations and leg swelling.  Neurological:  Positive for weakness. Negative for dizziness and headaches.       Objective    Blood pressure 125/77, pulse 92, height _0  (1.676 m), weight 163 lb 8 oz (74.2 kg), SpO2 99 %.   Physical Exam Constitutional:      General: He is awake.     Appearance: He is well-developed.  HENT:     Head: Normocephalic.  Eyes:     Conjunctiva/sclera: Conjunctivae normal.  Cardiovascular:     Rate and Rhythm: Normal rate and regular rhythm.     Heart sounds: Normal heart sounds.  Pulmonary:     Effort: Pulmonary effort is normal.     Breath sounds: Normal breath sounds.  Skin:    General: Skin is warm.  Neurological:     Mental Status: He is alert and oriented to person, place, and time.  Psychiatric:        Attention and Perception: Attention normal.        Mood and Affect: Mood normal.        Speech: Speech normal.        Behavior: Behavior is cooperative.      Results for orders placed or performed in visit on 08/14/22  POCT glycosylated hemoglobin (Hb A1C)  Result Value Ref Range   Hemoglobin A1C 6.3 (A) 4.0 - 5.6 %   HbA1c POC (<> result, manual entry)     HbA1c, POC (prediabetic range)     HbA1c, POC (controlled diabetic range)      Assessment & Plan     Problem List Items Addressed This Visit       Endocrine   Diabetes mellitus type II, controlled ([facility])    A1c today 6.3%. Cardiology had mentioned starting SGLT2, last GFR > 60, A1c in range. Pt is hesitant to start new meds, especially 2/2 cost. Mutual decision to not rx today. Pt declines flu vaccine Eye exam, foot exam utd Need uacr next visit On statin        Relevant Medications   glucose blood (ONETOUCH ULTRA) test strip   Other Relevant Orders   POCT glycosylated hemoglobin (Hb A1C) (Completed)     Other   Frequent falls - Primary    Long discussion w/ pt and daughter regarding falls, home safety. Discussed pt for balance, safe falling, use of cane. Pt declines. Advised that since pt is on blood thinners, falls are very dangerous, pt aware of risks Discussed w/ family possibility of TIA or stroke within the last year, but pt  is fully on preventative meds. Since I have taken over care, no difference in speech appreciated Advised of stroke signs        Return in about 4 months (around 12/14/2022) for DMII, AVW.      I, Charles Kirschner, PA-C have reviewed all documentation for this visit. The documentation on  08/14/2022  for the exam, diagnosis, procedures, and orders are all accurate and complete.  Charles Kirschner, PA-C [facility] [address] [place], [place], [address] Office: [phone] Fax: [place]

## 2022-08-14 NOTE — Assessment & Plan Note (Addendum)
A1c today 6.3%. Cardiology had mentioned starting SGLT2, last GFR > 60, A1c in range. Pt is hesitant to start new meds, especially 2/2 cost. Mutual decision to not rx today. Pt declines flu vaccine Eye exam, foot exam utd Need uacr next visit On statin

## 2022-08-14 NOTE — Assessment & Plan Note (Addendum)
Long discussion w/ pt and daughter regarding falls, home safety. Discussed pt for balance, safe falling, use of cane. Pt declines. Advised that since pt is on blood thinners, falls are very dangerous, pt aware of risks Discussed w/ family possibility of TIA or stroke within the last year, but pt is fully on preventative meds. Since I have taken over care, no difference in speech appreciated Advised of stroke signs

## 2022-09-10 ENCOUNTER — Other Ambulatory Visit: Payer: Self-pay | Admitting: Cardiology

## 2022-09-13 ENCOUNTER — Ambulatory Visit: Payer: PPO | Admitting: Physician Assistant

## 2022-09-17 ENCOUNTER — Other Ambulatory Visit (HOSPITAL_COMMUNITY): Payer: PPO

## 2022-09-18 ENCOUNTER — Other Ambulatory Visit: Payer: PPO

## 2022-09-30 ENCOUNTER — Other Ambulatory Visit: Payer: Self-pay | Admitting: Cardiology

## 2022-10-08 ENCOUNTER — Other Ambulatory Visit: Payer: Self-pay | Admitting: Cardiology

## 2022-10-08 NOTE — Telephone Encounter (Signed)
Prescription refill request for Eliquis received. Indication: AF Last office visit: 05/24/22  Roni Bread MD Scr: 0.75 on 05/17/22 Age: 88 Weight: 74kg  Based on above findings Eliquis '5mg'$  twice daily is the appropriate dose.  Refill approved.

## 2022-10-18 ENCOUNTER — Ambulatory Visit: Payer: PPO | Admitting: Cardiology

## 2022-10-18 ENCOUNTER — Ambulatory Visit: Payer: PPO | Admitting: Podiatry

## 2022-10-18 ENCOUNTER — Encounter: Payer: Self-pay | Admitting: Podiatry

## 2022-10-18 DIAGNOSIS — B351 Tinea unguium: Secondary | ICD-10-CM

## 2022-10-18 DIAGNOSIS — D689 Coagulation defect, unspecified: Secondary | ICD-10-CM

## 2022-10-18 DIAGNOSIS — N181 Chronic kidney disease, stage 1: Secondary | ICD-10-CM

## 2022-10-18 DIAGNOSIS — M79674 Pain in right toe(s): Secondary | ICD-10-CM

## 2022-10-18 DIAGNOSIS — E1122 Type 2 diabetes mellitus with diabetic chronic kidney disease: Secondary | ICD-10-CM

## 2022-10-18 DIAGNOSIS — M79675 Pain in left toe(s): Secondary | ICD-10-CM

## 2022-10-18 NOTE — Progress Notes (Signed)
This patient returns to my office for at risk foot care.  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  Patient is taking plavix.  This patient is unable to cut nails himself since the patient cannot reach his nails.These nails are painful walking and wearing shoes.  This patient presents for at risk foot care today.  General Appearance  Alert, conversant and in no acute stress.  Vascular  Dorsalis pedis and posterior tibial  pulses are palpable  bilaterally.  Capillary return is within normal limits  bilaterally. Temperature is within normal limits  bilaterally.  Neurologic  Senn-Weinstein monofilament wire test within normal limits  bilaterally. Muscle power within normal limits bilaterally.  Nails Thick disfigured discolored nails with subungual debris  from hallux to fifth toes bilaterally. No evidence of bacterial infection or drainage bilaterally.  Orthopedic  No limitations of motion  feet .  No crepitus or effusions noted.  No bony pathology or digital deformities noted.  Skin  normotropic skin with no porokeratosis noted bilaterally.  No signs of infections or ulcers noted.     Onychomycosis  Pain in right toes  Pain in left toes  Consent was obtained for treatment procedures.   Mechanical debridement of nails 1-5  bilaterally performed with a nail nipper.  Filed with dremel without incident.   Today he said they look good and said I did a good job.  I waited until he put on his socks.   Return office visit   10 weeks                 Told patient to return for periodic foot care and evaluation due to potential at risk complications.   Gardiner Barefoot DPM

## 2022-10-22 ENCOUNTER — Ambulatory Visit (INDEPENDENT_AMBULATORY_CARE_PROVIDER_SITE_OTHER): Payer: PPO

## 2022-10-22 DIAGNOSIS — I152 Hypertension secondary to endocrine disorders: Secondary | ICD-10-CM

## 2022-10-22 DIAGNOSIS — E1169 Type 2 diabetes mellitus with other specified complication: Secondary | ICD-10-CM

## 2022-10-22 DIAGNOSIS — E1122 Type 2 diabetes mellitus with diabetic chronic kidney disease: Secondary | ICD-10-CM

## 2022-10-22 MED ORDER — ROSUVASTATIN CALCIUM 10 MG PO TABS: 10.0000 mg | ORAL_TABLET | Freq: Every day | ORAL | 3 refills | Status: DC

## 2022-10-22 NOTE — Patient Instructions (Addendum)
Visit Information It was great speaking with you today!  Please let me know if you have any questions about our visit.  Patient Care Plan: General Pharmacy (Adult)     Problem Identified: Hypertension, Hyperlipidemia, Diabetes, Atrial Fibrillation, Coronary Artery Disease, and GERD   Priority: High     Long-Range Goal: Patient-Specific Goal   Start Date: 12/07/2021  Expected End Date: 12/07/2022  This Visit's Progress: On track  Recent Progress: On track  Priority: High  Note:   Current Barriers:  No barriers noted   Pharmacist Clinical Goal(s):  Patient will maintain control of diabetes as evidenced by A1c less than 8%  through collaboration with PharmD and provider.   Interventions: 1:1 collaboration with [name], PA-C regarding development and update of comprehensive plan of care as evidenced by provider attestation and co-signature Inter-disciplinary care team collaboration (see longitudinal plan of care) Comprehensive medication review performed; medication list updated in electronic medical record  Hypertension (BP goal <140/90) -Controlled -Current treatment: Metoprolol XL 25 mg daily: Appropriate, Effective, Safe, Accessible Ramipril 7.5 mg daily: Appropriate, Effective, Safe, Accessible  -Medications previously tried: NA  -Current home readings: Does not have home blood pressure monitor.   -Denies hypotensive/hypertensive symptoms -Recommended to continue current medication  Atrial Fibrillation (Goal: prevent stroke and major bleeding) -Controlled -CHADSVASC: 5 -Current treatment: Rate control: Metoprolol XL 25 mg daily: Appropriate, Effective, Safe, Accessible  Anticoagulation: Eliquis 5 mg twice daily: Appropriate, Effective, Safe, Query accessible  -Medications previously tried: NA -asymptomatic A-Fib, denies unusual or concerning bleeding.  -Patient concerned regarding cost of Eliquis, states he is currently able to afford medication. Based on  patient income patient will likely need to purchase ~9 months of Eliquis in order to meet out of pocket spending costs. Informed patient of this and to inform me if he enters into [facility].  -Recommended to continue current medication  Hyperlipidemia: (LDL goal < 70) -Controlled: Not addressed during this visit. -Current treatment: Rosuvastatin 10 mg daily  -Medications previously tried: NA  -Recommended to continue current medication  Diabetes (A1c goal <8%) -Controlled: Not addressed during this visit. -Current medications: Metformin 1000 mg twice daily: Appropriate, Effective, Safe, Accessible -Medications previously tried: NA  -Current home glucose readings fasting glucose: NA -Denies hypoglycemic/hyperglycemic symptoms -Current meal patterns: Eats at [employer], fast food for most meals.  ham biscuit + hasbrowns  OR [employer] drinks: diet Pepsi 1 bottle daily, otherwise water  -Current exercise: Minimial -Recommended to continue current medication  GERD (Goal: Prevent reflux) -Controlled: Not addressed during this visit. -Current treatment  Pantoprazole 40 mg 1 tablet daily on Mon/Wed/Fri  -Medications previously tried: NA  -Recommended to continue current medication  Patient Goals/Self-Care Activities Patient will:  - check glucose daily before breakfast, document, and provide at future appointments check blood pressure weekly, document, and provide at future appointments  Follow Up Plan: Telephone follow up appointment with care management team member scheduled for:  04/22/2023 at 8:30 AM    Patient agreed to services and verbal consent obtained.   Patient verbalizes understanding of instructions and care plan provided today and agrees to view in [place]. Active MyChart status and patient understanding of how to access instructions and care plan via MyChart confirmed with patient.     [name], PharmD, Para March, CPP  Clinical Pharmacist Practitioner  [facility] [phone]

## 2022-10-22 NOTE — Progress Notes (Signed)
Chronic Care Management Pharmacy Note  10/22/2022 Name:  Charles Sellers. MRN:  [id number] DOB:  June 12, 1932  Summary: Patient presents for CCM follow-up. Reports two falls since PCP follow-up. Missed hand-rail. Denies head trauma. He did not have his falls evaluated.   Patient denies symptoms of hypotension or hypoglycemia.   Rosuvastatin 10 mg last filled on 05/06/22 for a 90-Day supply with [employer]. Refill sent in today to patient pharmacy.   Recommendations/Changes made from today's visit: Continue current medications  Plan: CPP follow-up 6 months  Subjective: Charles Sellers. is an 88 y.o. year old male who is a primary patient of [name], Ria Comment, [place].  The CCM team was consulted for assistance with disease management and care coordination needs.    Engaged with patient by telephone for follow up visit in response to provider referral for pharmacy case management and/or care coordination services.   Consent to Services:  The patient was given information about Chronic Care Management services, agreed to services, and gave verbal consent prior to initiation of services.  Please see initial visit note for detailed documentation.   Patient Care Team: [name], Hershal Coria as PCP - General (Physician Assistant) Leonie Man, MD as PCP - Cardiology (Cardiology) Birder Robson, MD as Referring Physician (Ophthalmology) Birder Robson, MD as Referring Physician (Ophthalmology) Leonie Man, MD as Consulting Physician (Cardiology) Gardiner Barefoot, DPM as Consulting Physician (Podiatry) Germaine Pomfret, [facility] (Pharmacist)  Recent office visits: 08/14/22: Patient presented to [name], PA-C for follow-up.   Recent consult visits: 10/18/22: Patient presented to Dr. Prudence Davidson (Podiatry) for follow-up.  05/24/22: Glenetta Hew, MD (Cardiology) for [facility] visits: None in previous 6 months   Objective:  Lab Results  Component Value Date    CREATININE 0.84 05/14/2022   BUN 11 05/14/2022   GFRNONAA 84 12/05/2020   GFRAA 97 12/05/2020   NA 140 05/14/2022   K 4.7 05/14/2022   CALCIUM 8.9 05/14/2022   CO2 22 05/14/2022   GLUCOSE 160 (H) 05/14/2022    Lab Results  Component Value Date/Time   HGBA1C 6.3 (A) 08/14/2022 12:00 PM   HGBA1C 7.5 (H) 05/14/2022 10:45 AM   HGBA1C 6.9 (A) 01/08/2022 11:43 AM   HGBA1C 7.3 09/01/2021 05:50 PM   HGBA1C 8.0 (H) 12/05/2020 10:59 AM   MICROALBUR 50 01/02/2018 09:26 AM   MICROALBUR 50 06/18/2017 08:49 AM    Last diabetic Eye exam:  Lab Results  Component Value Date/Time   HMDIABEYEEXA No Retinopathy 11/23/2021 12:00 AM    Last diabetic Foot exam: No results found for: "HMDIABFOOTEX"   Lab Results  Component Value Date   CHOL 122 06/19/2021   HDL 36 (L) 06/19/2021   LDLCALC 74 06/19/2021   TRIG 52 06/19/2021   CHOLHDL 3.4 06/19/2021       Latest Ref Rng & Units 05/14/2022   10:45 AM 02/05/2022   10:17 AM 06/19/2021   11:35 AM  Hepatic Function  Total Protein 6.0 - 8.5 g/dL 7.0  6.5  7.2   Albumin 3.6 - 4.6 g/dL 4.0  4.0  4.4   AST 0 - 40 IU/L _0 ALT 0 - 44 IU/L _1 Alk Phosphatase 44 - 121 IU/L 130  92  85   Total Bilirubin 0.0 - 1.2 mg/dL 1.4  1.1  1.2     Lab Results  Component Value Date/Time   TSH 1.450 06/19/2021 11:35 AM  TSH 1.150 01/26/2020 11:04 AM       Latest Ref Rng & Units 05/14/2022   10:45 AM 06/19/2021   11:35 AM 12/05/2020   10:59 AM  CBC  WBC 3.4 - 10.8 x10E3/uL 6.4  7.2  7.7   Hemoglobin 13.0 - 17.7 g/dL 13.1  14.9  15.5   Hematocrit 37.5 - 51.0 % 41.5  44.3  45.5   Platelets 150 - 450 x10E3/uL 159  167  169     No results found for: "VD25OH"  Clinical ASCVD: No  The ASCVD Risk score (Arnett DK, et al., 2019) failed to calculate for the following reasons:   The 2019 ASCVD risk score is only valid for ages 44 to 60       05/17/2022    2:09 PM 11/27/2021    2:23 PM 11/07/2021   10:00 AM  Depression screen PHQ 2/9   Decreased Interest 0 0 0  Down, Depressed, Hopeless 0 1 0  PHQ - 2 Score 0 1 0  Altered sleeping  0 1  Tired, decreased energy  0 0  Change in appetite  0 0  Feeling bad or failure about yourself   0 0  Trouble concentrating  0 0  Moving slowly or fidgety/restless  0 0  Suicidal thoughts  0 0  PHQ-9 Score  1 1  Difficult doing work/chores  Not difficult at all Not difficult at all    Social History   Tobacco Use  Smoking Status Never  Smokeless Tobacco Never   BP Readings from Last 3 Encounters:  08/14/22 125/77  05/24/22 132/78  05/14/22 130/81   Pulse Readings from Last 3 Encounters:  08/14/22 92  05/24/22 100  05/14/22 88   Wt Readings from Last 3 Encounters:  08/14/22 163 lb 8 oz (74.2 kg)  05/24/22 163 lb 4 oz (74 kg)  05/14/22 171 lb 11.2 oz (77.9 kg)   BMI Readings from Last 3 Encounters:  08/14/22 26.39 kg/m  05/24/22 26.35 kg/m  05/14/22 27.71 kg/m    Assessment/Interventions: Review of patient past medical history, allergies, medications, health status, including review of consultants reports, laboratory and other test data, was performed as part of comprehensive evaluation and provision of chronic care management services.   SDOH:  (Social Determinants of Health) assessments and interventions performed: Yes SDOH Interventions    Flowsheet Row Chronic Care Management from 01/16/2022 in [facility] Most recent reading at 01/16/2022 12:01 PM Chronic Care Management from 11/21/2021 in [facility] Most recent reading at 12/07/2021 11:46 AM Clinical Support from 11/27/2021 in [facility] Most recent reading at 11/27/2021  2:24 PM Office Visit from 11/07/2021 in [facility] Most recent reading at 11/07/2021 10:00 AM Clinical Support from 11/21/2020 in [facility] Most recent reading at 11/21/2020  2:11 PM  SDOH Interventions       Food Insecurity Interventions -- -- Intervention Not  Indicated -- --  Housing Interventions -- -- Intervention Not Indicated -- --  Transportation Interventions -- -- Intervention Not Indicated -- --  Depression Interventions/Treatment  -- -- Patient refuses Treatment PHQ2-9 Score <4 Follow-up Not Indicated --  Financial Strain Interventions Intervention Not Indicated Intervention Not Indicated  [PAP] Intervention Not Indicated -- --  Physical Activity Interventions -- -- Intervention Not Indicated -- Patient Refused  Stress Interventions -- -- Intervention Not Indicated -- --  Social Connections Interventions -- -- Intervention Not Indicated -- --        SDOH Screenings  Food Insecurity: No Food Insecurity (05/17/2022)  Housing: Low Risk  (05/17/2022)  Transportation Needs: Unmet Transportation Needs (05/17/2022)  Alcohol Screen: Low Risk  (05/17/2022)  Depression (PHQ2-9): Low Risk  (05/17/2022)  Financial Resource Strain: Low Risk  (05/17/2022)  Physical Activity: Insufficiently Active (11/27/2021)  Social Connections: Moderately Isolated (05/17/2022)  Stress: No Stress Concern Present (05/17/2022)  Tobacco Use: Low Risk  (10/18/2022)    CCM Care Plan  Allergies  Allergen Reactions   Niaspan [Niacin Er] Rash   Penicillins Rash    Medications Reviewed Today     Reviewed by Gardiner Barefoot, DPM (Physician) on 10/18/22 at 1058  Med List Status: <None>   Medication Order Taking? Sig Documenting Provider Last Dose Status Informant  chlorhexidine (PERIDEX) 0.12 % solution [id number] No SMARTSIG:By Mouth [provider] Taking Active   ELIQUIS 5 MG TABS tablet [id number]  TAKE 1 TABLET(5 MG) BY MOUTH TWICE DAILY Leonie Man, MD  Active   furosemide (LASIX) 20 MG tablet [id number] No Take 1 tablet 2 days per week. May take as needed for swelling. Leonie Man, MD Taking Active   glucose blood [facility] ULTRA) test strip [id number]  USE TO TEST BLOOD SUGAR EVERY DAY AS DIRECTED [name], PA-C  Active   [facility] FINEPOINT  LANCETS MISC [id number] No Test fasting blood sugar once each morning. Chrismon, Vickki Muff, PA-C Taking Active   metFORMIN (GLUCOPHAGE) 500 MG tablet [id number] No Take 2 tablets (1,000 mg total) by mouth 2 (two) times daily with a meal. [name], PA-C Taking Active   metoprolol succinate (TOPROL-XL) 25 MG 24 hr tablet [id number] No Take 1 tablet (25 mg total) by mouth daily. Gwyneth Sprout, FNP Taking Active   Multiple Vitamins-Minerals (ICAPS AREDS 2 PO) [id number] No Take 1 capsule by mouth daily. [provider] Taking Active   Multiple Vitamins-Minerals (PRESERVISION AREDS 2) CHEW [id number] No Chew by mouth. [provider] Taking Active   naproxen sodium (ANAPROX) 220 MG tablet [id number] No Take 220 mg by mouth daily as needed. [provider] Taking Active   nitroGLYCERIN (NITROSTAT) 0.4 MG SL tablet [id number] No Place 1 tablet (0.4 mg total) under the tongue every 5 (five) minutes as needed for chest pain. Chrismon, Vickki Muff, PA-C Taking Active   Omega-3 Fatty Acids (FISH OIL) 1000 MG CAPS [id number] No Take 1,000 mg by mouth daily. [provider] Taking Active   pantoprazole (PROTONIX) 40 MG tablet [id number]  TAKE 1 TABLET BY MOUTH ON MONDAY, [place] [facility] Leonie Man, MD  Active   ramipril (ALTACE) 2.5 MG capsule [id number] No Take 1 capsule (2.5 mg total) by mouth daily. Take with 12m capsule for 7.571mtotal. HaLeonie ManMD Taking Active   ramipril (ALTACE) 5 MG capsule [id number] Take 1 capsule (5 mg total) by mouth daily. Take with 2.5 mg capsule for 7.37m11motal. HarLeonie ManD Taking Active   rosuvastatin (CRESTOR) 10 MG tablet [id number] TAKE 1 TABLET(10 MG) BY MOUTH DAILY HarLeonie ManD Taking Active             Patient Active Problem List   Diagnosis Date Noted   Frequent falls 08/14/2022   Other fatigue 05/14/2022   Fall (on)(from) sidewalk curb, initial encounter 03/16/2022   Paresthesia of  both feet 02/05/2022   Peripheral edema 02/05/2022   Weakness of extremity 02/05/2022   Viral URI with cough 11/07/2021   Permanent atrial fibrillation (HCCLexaCHA2DS2-VASc score 5  10/11/2020   Coagulation defect ([place]) 09/01/2020   Pain due to onychomycosis of toenails of both feet 07/06/2019   Mild aortic stenosis 09/04/2017   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   Mitral regurgitation 07/30/2017   History of prolonged Q-T interval on ECG 06/22/2015   Prostate lump 06/22/2015   Tumoral calcinosis 06/22/2015   Diabetes mellitus, type 2 ([place]) 06/22/2015   CAD S/P percutaneous coronary angioplasty    Diabetes mellitus type II, controlled ([place])    Hyperlipidemia associated with type 2 diabetes mellitus ([place])    Hypertension associated with diabetes ([place])    Arthropathia 02/17/2010   Chondrocalcinosis of multiple sites 08/11/2008   Acid reflux 08/11/2008   Cannot sleep 07/23/2008   UNSPECIFIED INFLAMMATORY POLYARTHROPATHY 07/20/2008   MYOSITIS 07/20/2008   FEVER UNSPECIFIED 07/20/2008   EXANTHEM 07/20/2008   Muscle ache 07/16/2008   Disorder of sacrum 06/19/2008   Thyrotoxicosis 06/19/2008    Immunization History  Administered Date(s) Administered   Fluad Quad(high Dose 65+) 10/20/2019, 09/01/2020   Influenza, High Dose Seasonal PF 10/18/2017, 10/24/2018   PFIZER(Purple Top)SARS-COV-2 Vaccination 10/13/2020, 11/15/2020   Pneumococcal Conjugate-13 01/09/2016   Pneumococcal Polysaccharide-23 08/04/2008   Tdap 01/09/2016, 12/18/2019   Zoster Recombinat (Shingrix) 11/04/2019, 01/14/2020    Conditions to be addressed/monitored:  Hypertension, Hyperlipidemia, Diabetes, Atrial Fibrillation, Coronary Artery Disease, and GERD  Care Plan : General Pharmacy (Adult)  Updates made by Germaine Pomfret, RPH since 10/22/2022 12:00 AM     Problem: Hypertension, Hyperlipidemia, Diabetes, Atrial Fibrillation, Coronary Artery Disease, and GERD   Priority: High     Long-Range Goal:  Patient-Specific Goal   Start Date: 12/07/2021  Expected End Date: 12/07/2022  This Visit's Progress: On track  Recent Progress: On track  Priority: High  Note:   Current Barriers:  No barriers noted   Pharmacist Clinical Goal(s):  Patient will maintain control of diabetes as evidenced by A1c less than 8%  through collaboration with PharmD and provider.   Interventions: 1:1 collaboration with [name], PA-C regarding development and update of comprehensive plan of care as evidenced by provider attestation and co-signature Inter-disciplinary care team collaboration (see longitudinal plan of care) Comprehensive medication review performed; medication list updated in electronic medical record  Hypertension (BP goal <140/90) -Controlled -Current treatment: Metoprolol XL 25 mg daily: Appropriate, Effective, Safe, Accessible Ramipril 7.5 mg daily: Appropriate, Effective, Safe, Accessible  -Medications previously tried: NA  -Current home readings: Does not have home blood pressure monitor.   -Denies hypotensive/hypertensive symptoms -Recommended to continue current medication  Atrial Fibrillation (Goal: prevent stroke and major bleeding) -Controlled -CHADSVASC: 5 -Current treatment: Rate control: Metoprolol XL 25 mg daily: Appropriate, Effective, Safe, Accessible  Anticoagulation: Eliquis 5 mg twice daily: Appropriate, Effective, Safe, Query accessible  -Medications previously tried: NA -asymptomatic A-Fib, denies unusual or concerning bleeding.  -Patient concerned regarding cost of Eliquis, states he is currently able to afford medication. Based on patient income patient will likely need to purchase ~9 months of Eliquis in order to meet out of pocket spending costs. Informed patient of this and to inform me if he enters into [employer]  -Recommended to continue current medication  Hyperlipidemia: (LDL goal < 70) -Controlled: Not addressed during this visit. -Current  treatment: Rosuvastatin 10 mg daily  -Medications previously tried: NA  -Recommended to continue current medication  Diabetes (A1c goal <8%) -Controlled: Not addressed during this visit. -Current medications: Metformin 1000 mg twice daily: Appropriate, Effective, Safe, Accessible -Medications previously tried: NA  -Current home glucose  readings fasting glucose: NA -Denies hypoglycemic/hyperglycemic symptoms -Current meal patterns: Eats at [employer], fast food for most meals.  ham biscuit + hasbrowns  OR Hamburger drinks: diet Pepsi 1 bottle daily, otherwise water  -Current exercise: Minimial -Recommended to continue current medication  GERD (Goal: Prevent reflux) -Controlled: Not addressed during this visit. -Current treatment  Pantoprazole 40 mg 1 tablet daily on Mon/Wed/Fri  -Medications previously tried: NA  -Recommended to continue current medication  Patient Goals/Self-Care Activities Patient will:  - check glucose daily before breakfast, document, and provide at future appointments check blood pressure weekly, document, and provide at future appointments  Follow Up Plan: Telephone follow up appointment with care management team member scheduled for:  04/22/2023 at 8:30 AM     Medication Assistance:  Will apply for Eliquis patient assistance in 2023 when patient qualifies.  Compliance/Adherence/Medication fill history: Care Gaps: COVID-19 Vaccine Booster 3 Diabetic Foot Exam  Star-Rating Drugs: Metformin 500 mg last filled on 09/07/22 for a 90-Day supply with [facility] Rosuvastatin 10 mg last filled on 05/06/22 for a 90-Day supply with [employer]. Refill sent in today to patient pharmacy.  Ramipril 5 mg last filled on 08/25/22 for a 90-Day supply with [facility]  Patient's preferred pharmacy is:  [facility] [place], [address] [place] [address] Phone:  [phone] Fax: [phone]  Uses pill box? Yes Pt endorses 100% compliance  We discussed: Current pharmacy is preferred with insurance plan and patient is satisfied with pharmacy services Patient decided to: Continue current medication management strategy  Care Plan and Follow Up Patient Decision:  Patient agrees to Care Plan and Follow-up.  Plan: Telephone follow up appointment with care management team member scheduled for:  04/22/2023 at 8:30 AM  [name], PharmD, [facility] [place], [place] [phone]

## 2022-10-26 IMAGING — CR DG KNEE COMPLETE 4+V*R*
4 series · 4 of 4 positions shown · non-contrast
Comparison: None.

CLINICAL DATA: Fall

EXAM:
RIGHT KNEE - COMPLETE 4 VIEW

[knee ap]
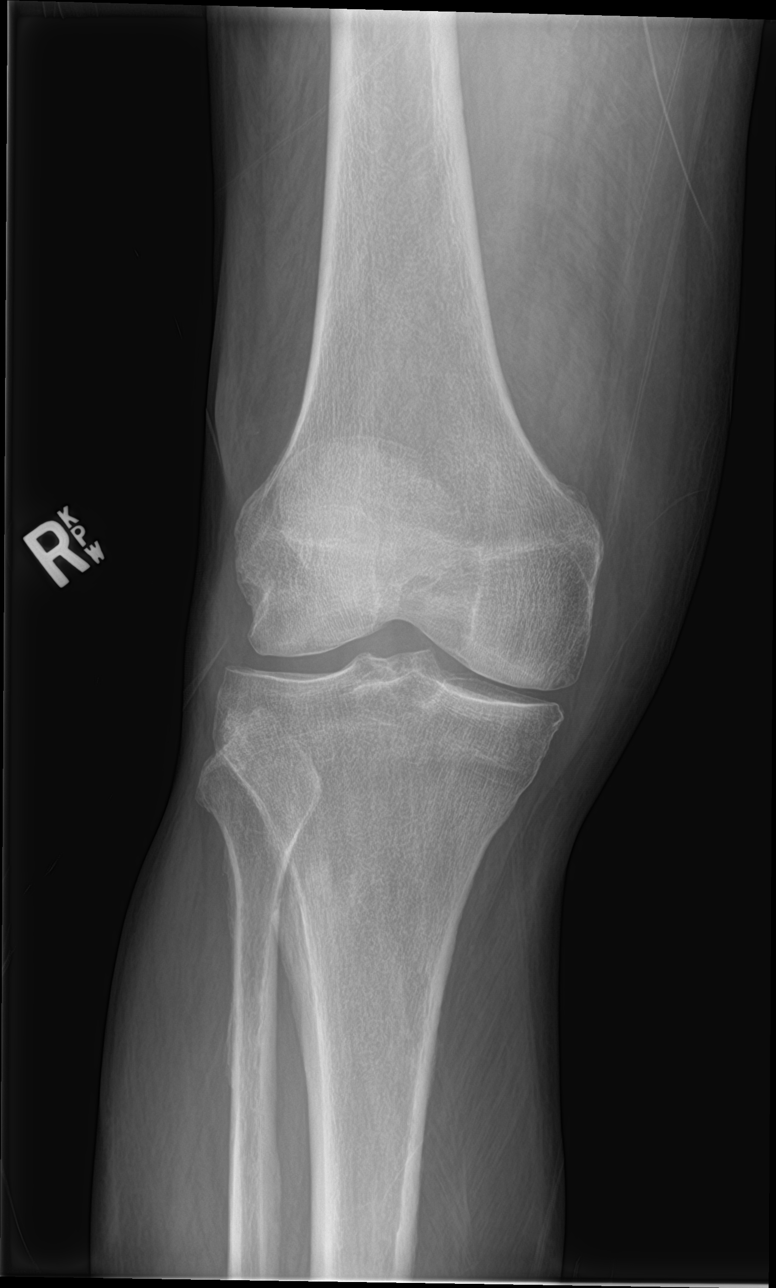

[knee obl (1 of 2)]
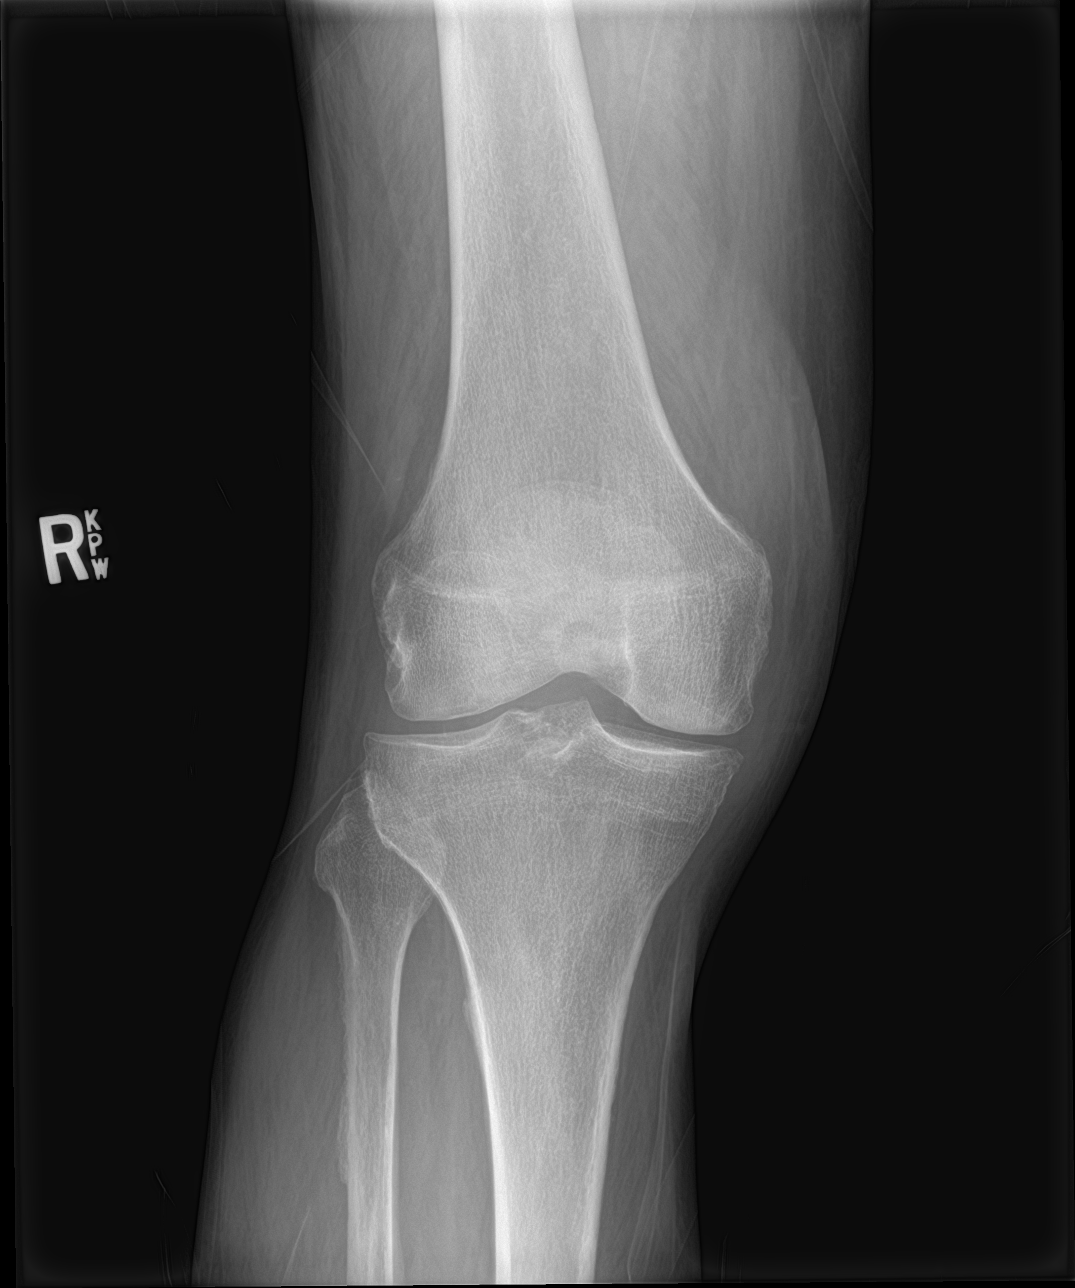

[knee obl (2 of 2)]
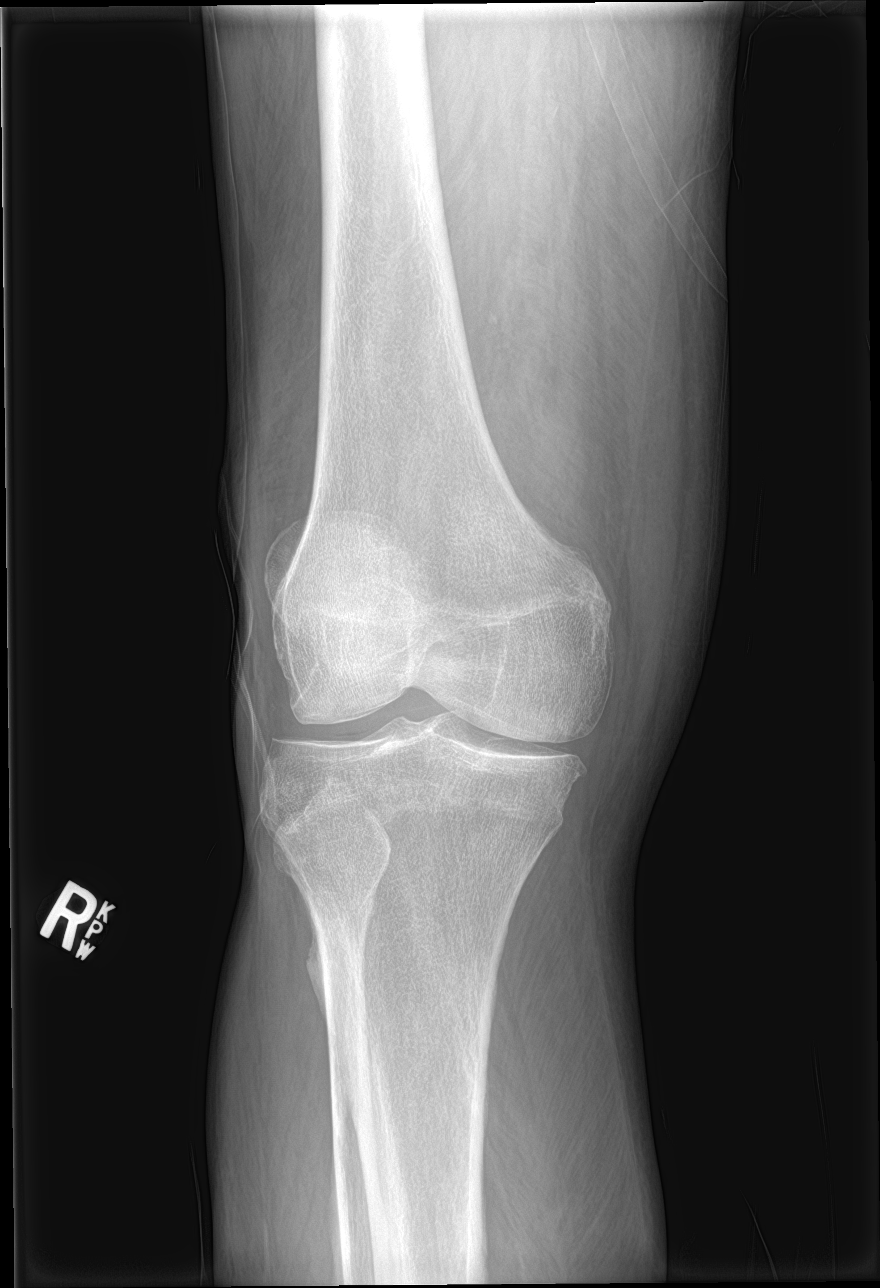

[knee lat]
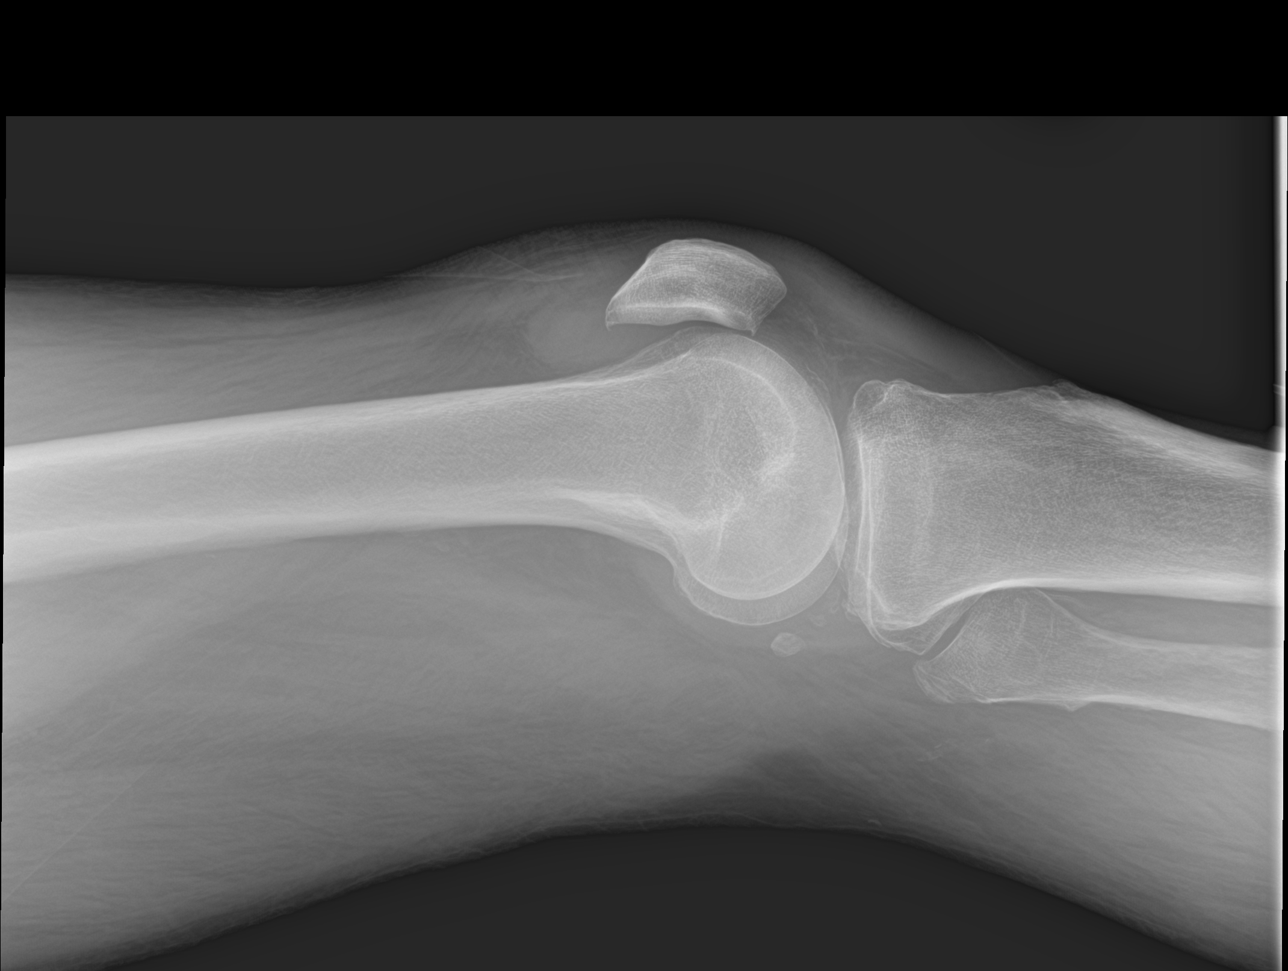

[4 of 4 positions shown; findings below may reference images not displayed]

FINDINGS: No evidence of fracture or dislocation. Small joint effusion.
Minimal degenerative changes of the patellofemoral compartment
consisting of osteophyte formation. Soft tissues are unremarkable.
IMPRESSION: 1. No acute osseous abnormality.
2. Small joint effusion.

## 2022-10-26 IMAGING — CT CT CERVICAL SPINE W/O CM
3 of 4 series · 12 of 33 positions shown, 14 images · non-contrast
Comparison: None.

CLINICAL DATA: Neck trauma (Age >= 65y)



[Series 6: sag bone · sagittal · 0.35mm/px · 5 of 65 slices shown, 6 images]
[im 22/65  bone]
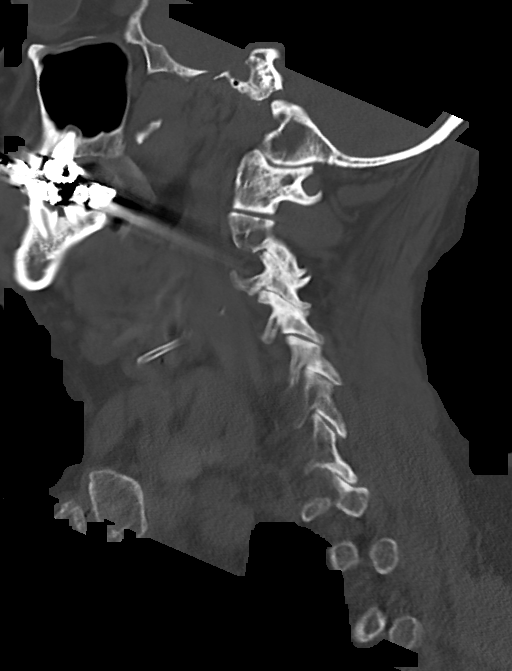
[im 27/65  bone]
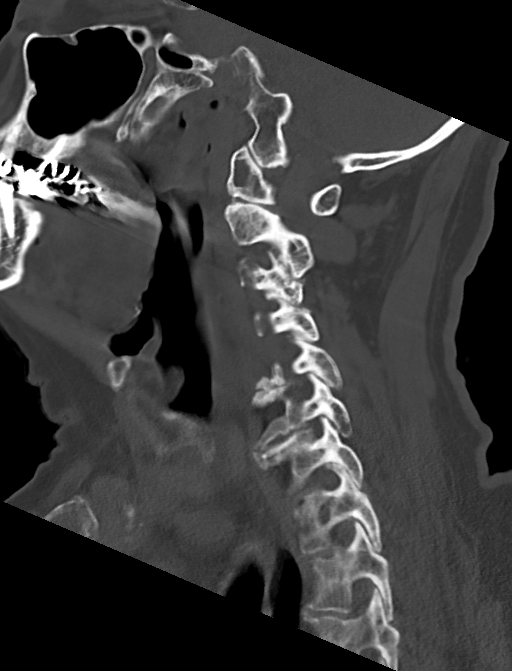
[im 33/65  soft-tissue]
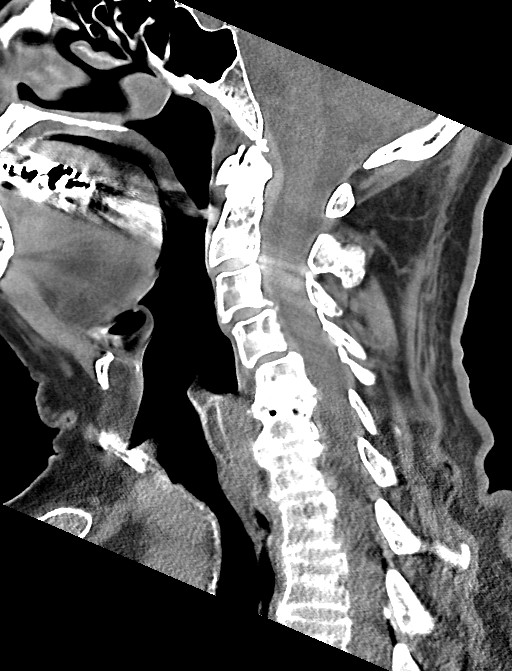
[im 33/65  bone]
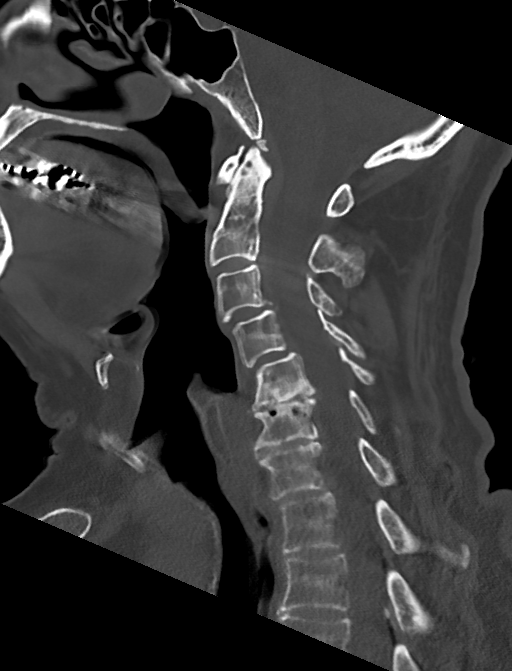
[im 38/65  bone]
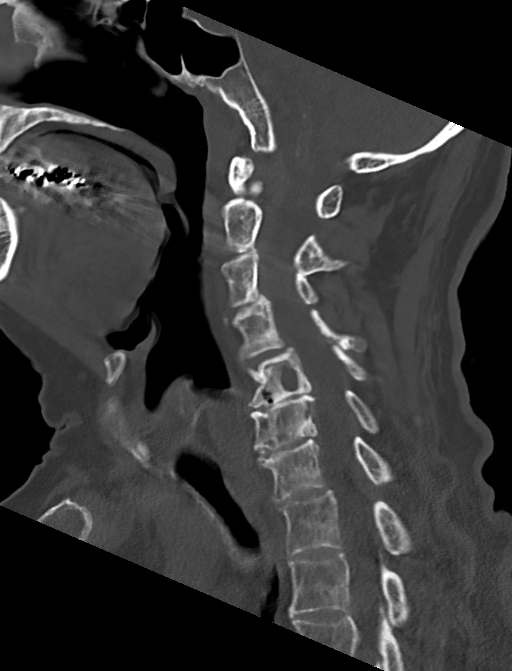
[im 43/65  bone]
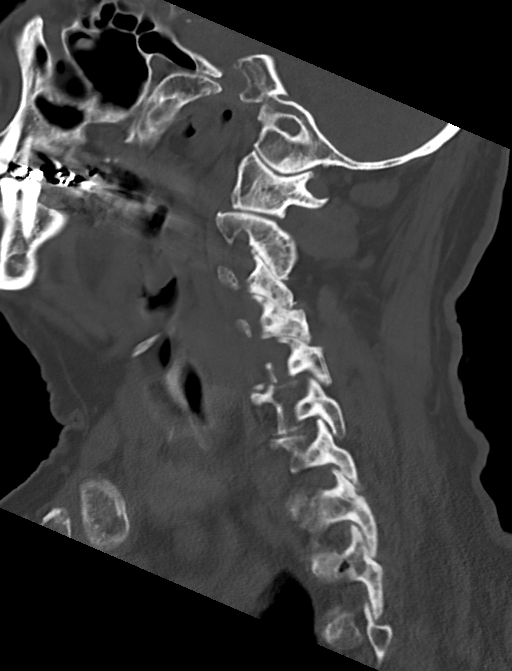

[Series 7: cor bone · coronal · 0.25mm/px · 3 of 62 slices shown]
[im 13/62  bone]
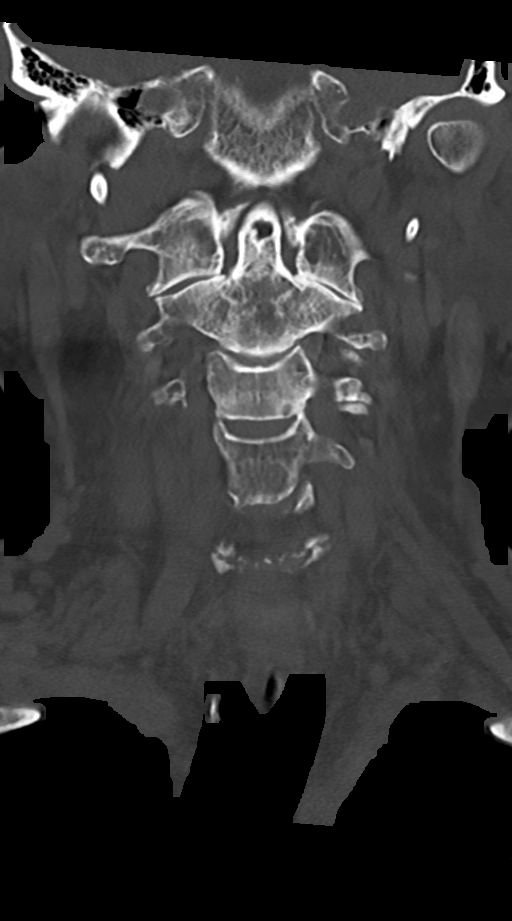
[im 25/62  bone]
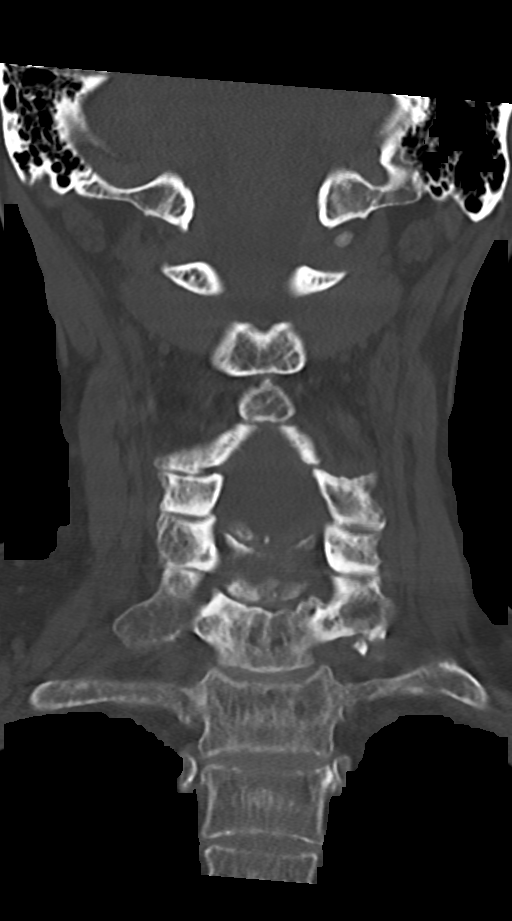
[im 37/62  bone]
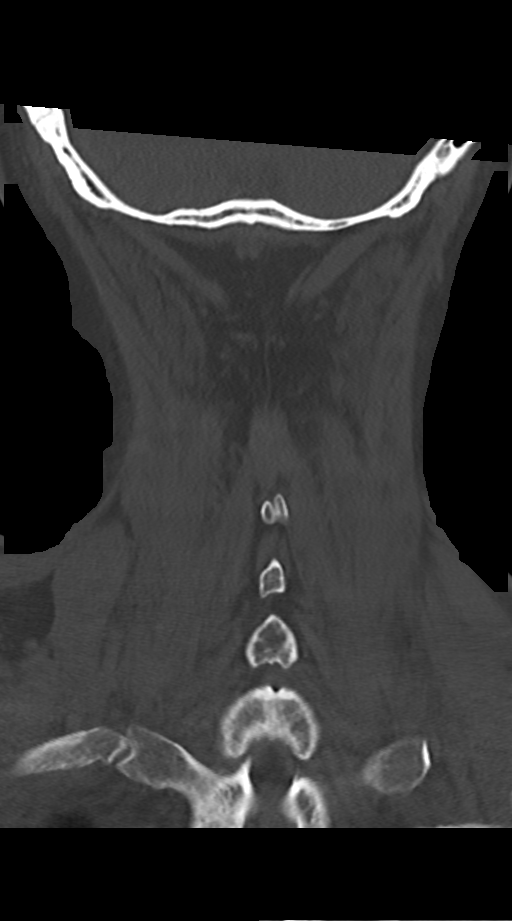

[Series 8: orthogonal axials · axial · 0.24mm/px · z∈[-246,-159]mm · 4 of 98 slices shown, 5 images]
[im 17/98  soft-tissue]
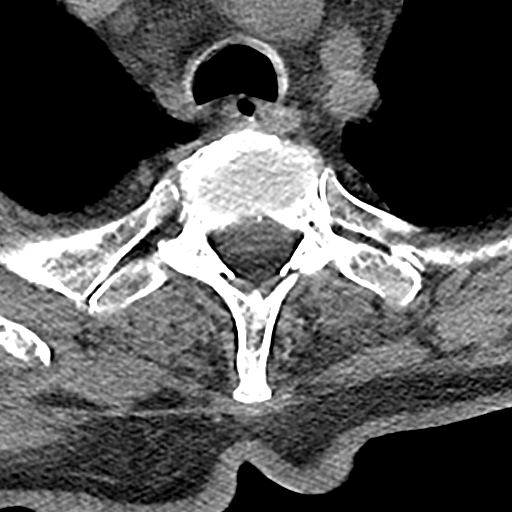
[im 17/98  bone]
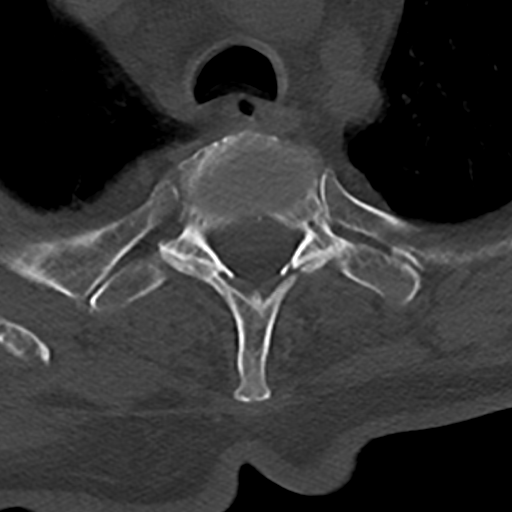
[im 33/98  bone]
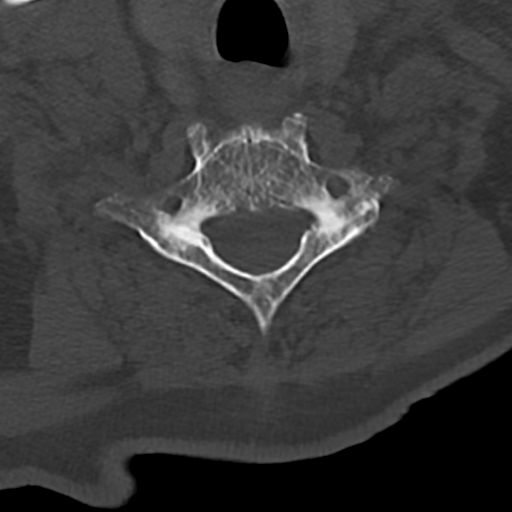
[im 65/98  bone]
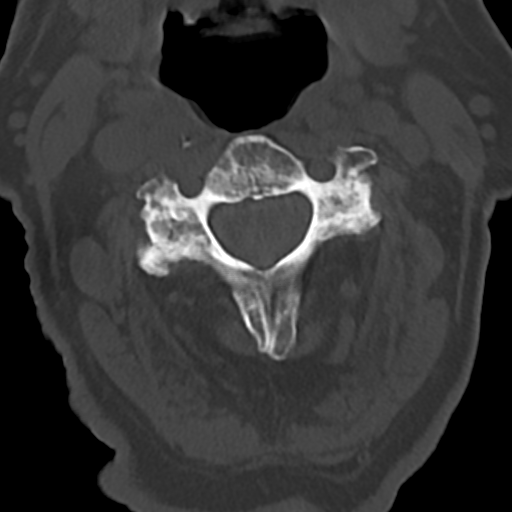
[im 81/98  bone]
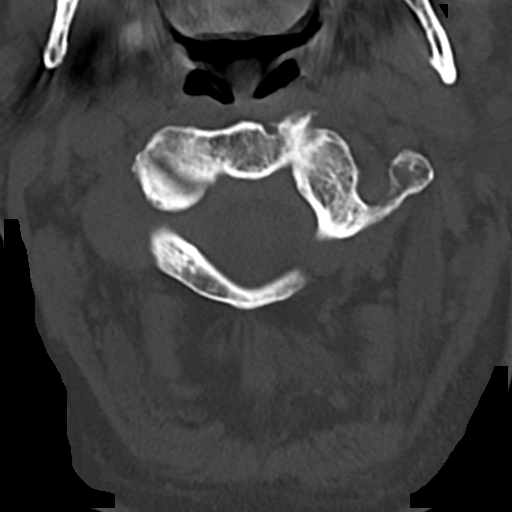

[12 of 33 positions shown; findings below may reference images not displayed]

FINDINGS: Alignment: Anterolisthesis at C3-C4 and C4-C5.

Skull base and vertebrae: No acute cervical spine fracture. Lucent
lesion at the left aspect of the C5 vertebral body is without
aggressive features. Degenerative endplate irregularity.

Soft tissues and spinal canal: No prevertebral fluid or swelling. No
visible canal hematoma.

Disc levels: Multilevel degenerative changes are present including
disc space narrowing, endplate osteophytes, and facet and
uncovertebral hypertrophy.

Upper chest: No apical lung mass.

Other: Calcified plaque at the common carotid bifurcations.
IMPRESSION: No acute cervical spine fracture.

## 2022-10-26 IMAGING — CT CT HEAD W/O CM
4 series · 16 of 47 positions shown, 18 images · non-contrast
Comparison: None.

CLINICAL DATA: Head trauma, moderate-severe; Facial trauma, blunt

EXAM:
CT HEAD WITHOUT CONTRAST
CT MAXILLOFACIAL WITHOUT CONTRAST
TECHNIQUE: Multidetector CT imaging of the head and maxillofacial structures
were performed using the standard protocol without intravenous
contrast. Multiplanar CT image reconstructions of the maxillofacial
structures were also generated.
RADIATION DOSE REDUCTION: This exam was performed according to the
departmental dose-optimization program which includes automated
exposure control, adjustment of the mA and/or kV according to
patient size and/or use of iterative reconstruction technique.

[Series 2: head wo · axial · 0.42mm/px · z∈[-92,+28]mm · 7 of 32 slices shown, 9 images]
[im 4/32  brain]
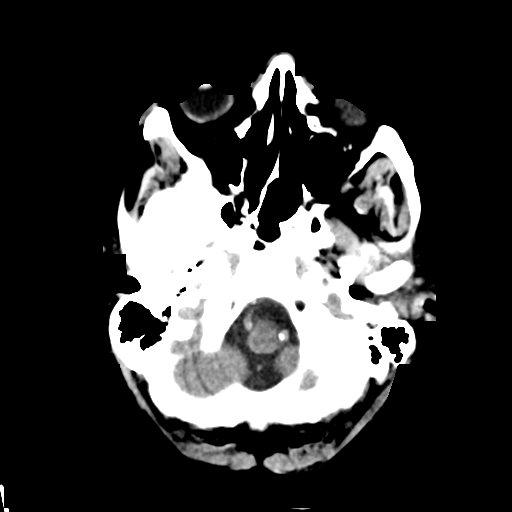
[im 4/32  bone]
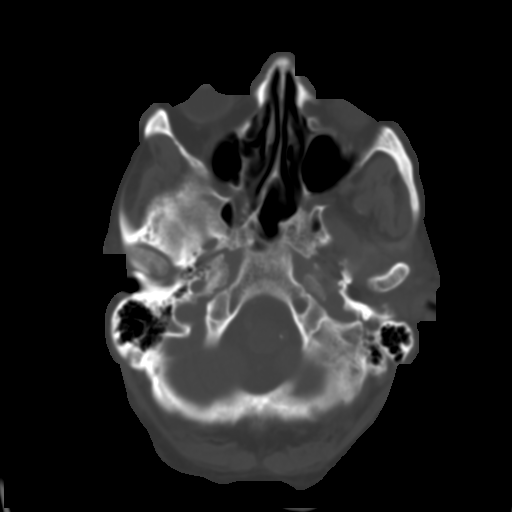
[im 8/32  brain]
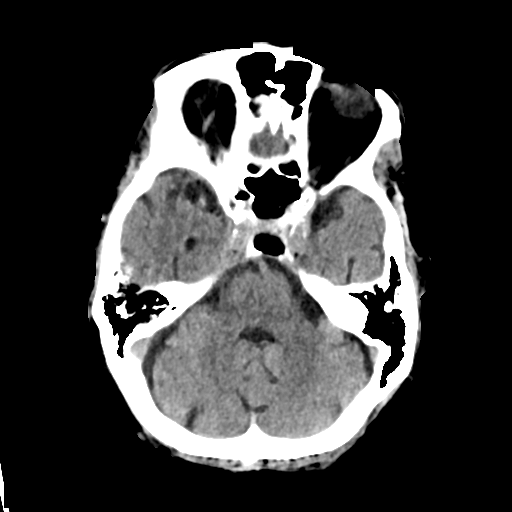
[im 12/32  brain]
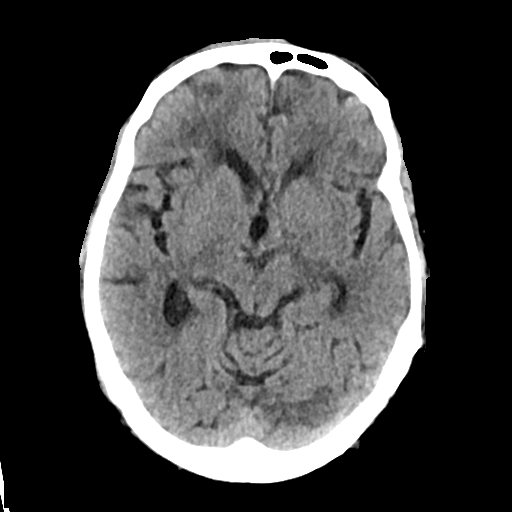
[im 16/32  brain]
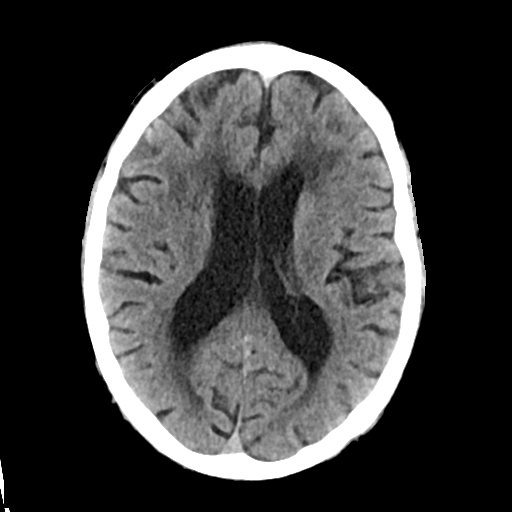
[im 20/32  brain]
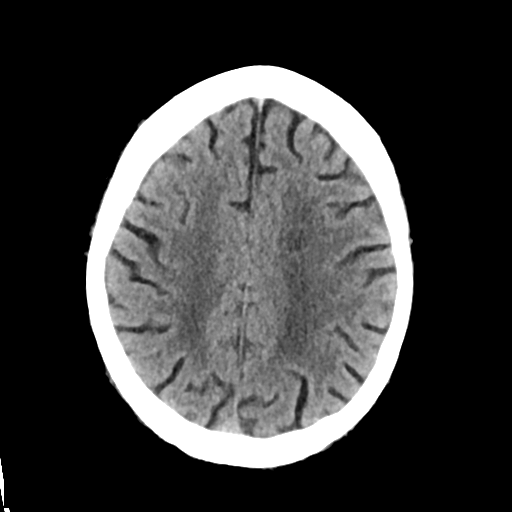
[im 20/32  bone]
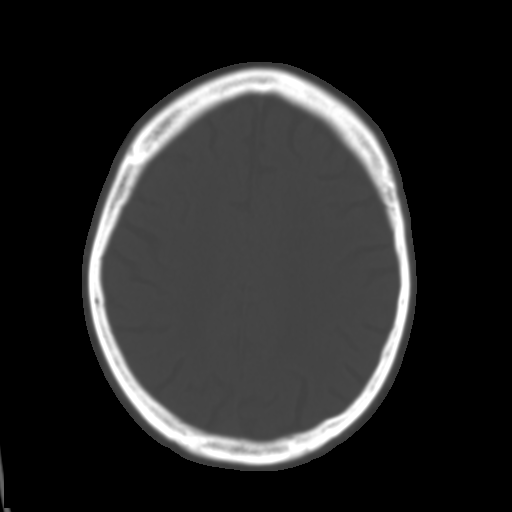
[im 24/32  brain]
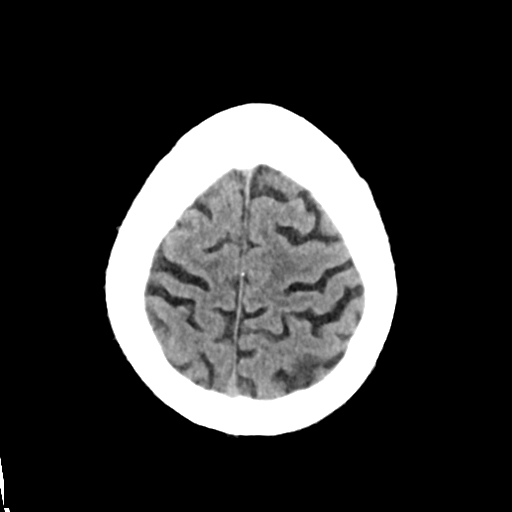
[im 28/32  brain]
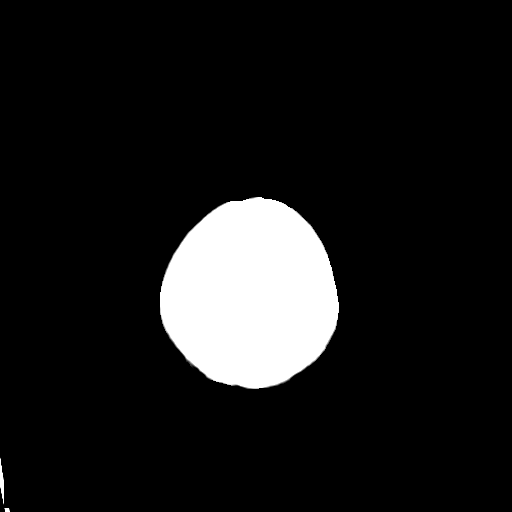

[Series 3: head bone · axial · 0.42mm/px · z∈[-93,-61]mm · 3 of 78 slices shown]
[im 8/78  bone]
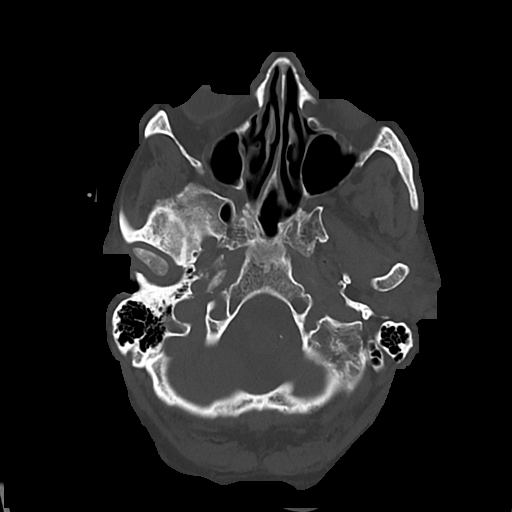
[im 16/78  bone]
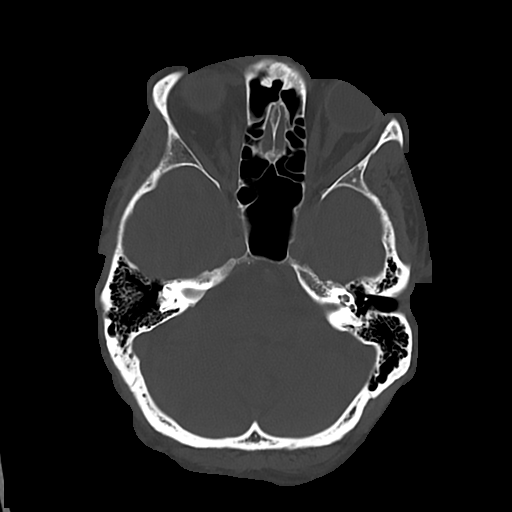
[im 24/78  bone]
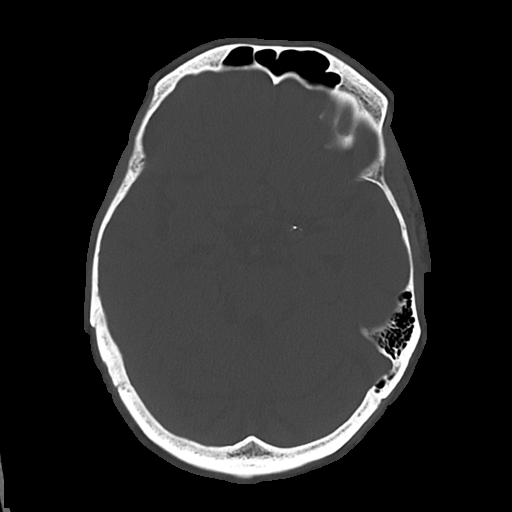

[Series 4: cor soft · coronal · 0.31mm/px · 3 of 66 slices shown]
[im 22/66  brain]
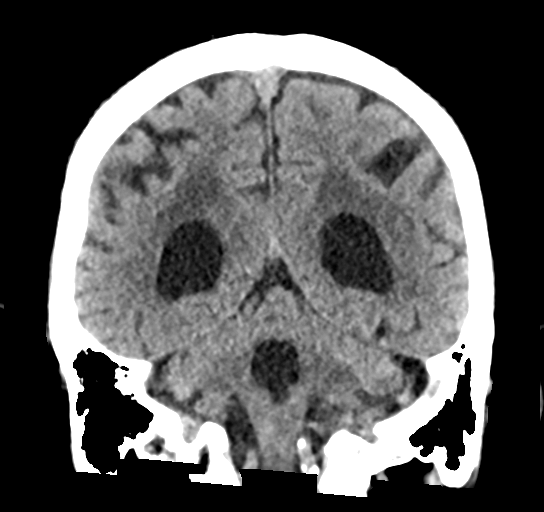
[im 29/66  brain]
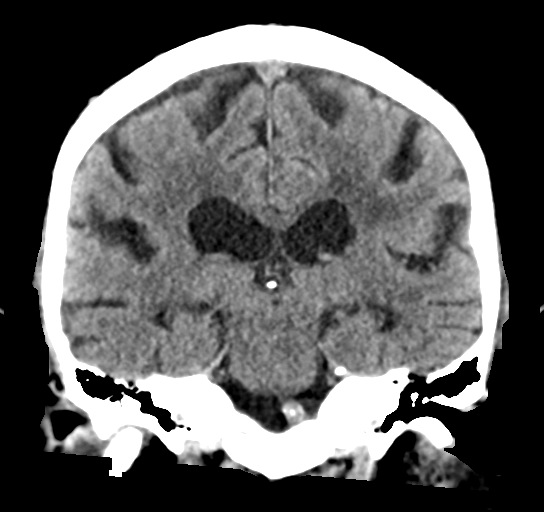
[im 37/66  brain]
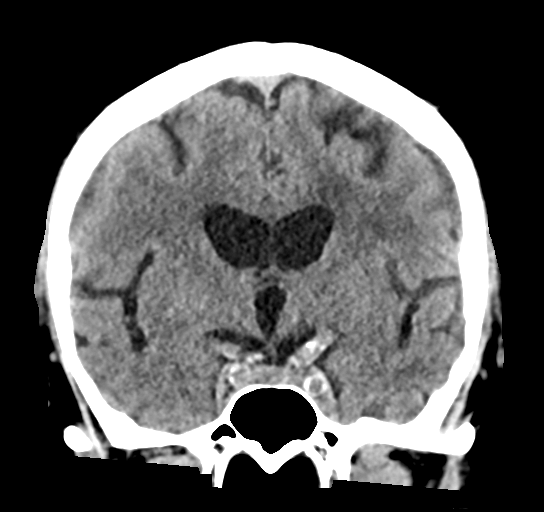

[Series 5: sag soft · sagittal · 0.29mm/px · 3 of 59 slices shown]
[im 20/59  brain]
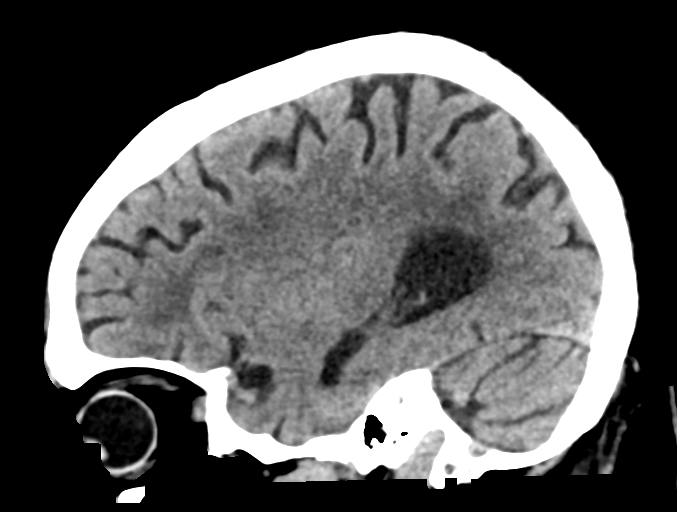
[im 30/59  brain]
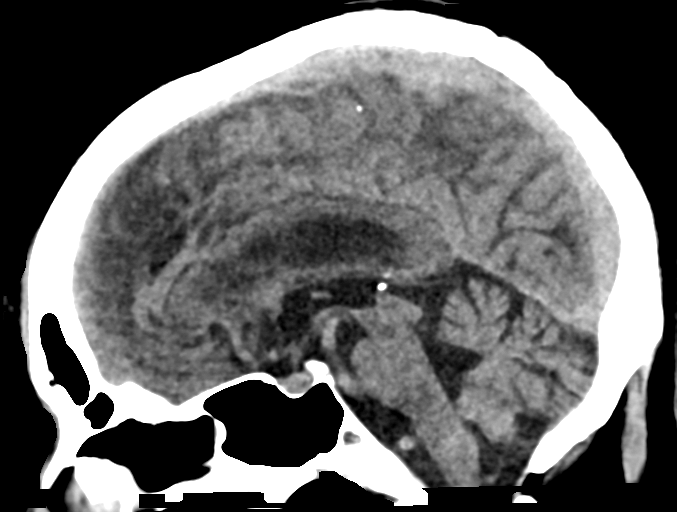
[im 39/59  brain]
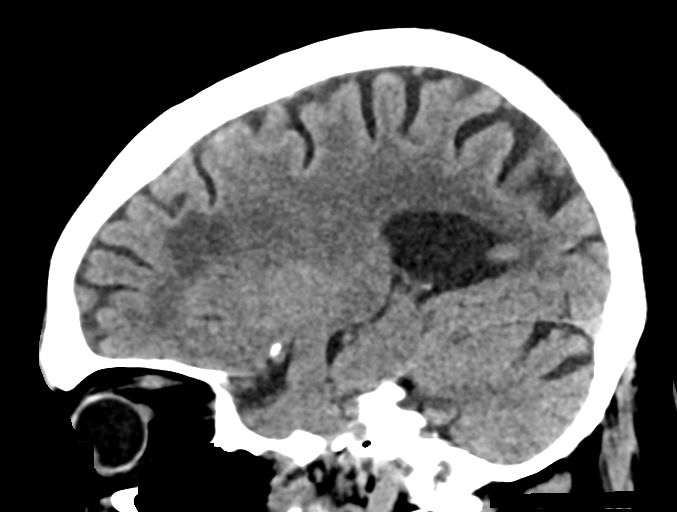

[16 of 47 positions shown; findings below may reference images not displayed]

FINDINGS: CT HEAD FINDINGS

Brain: No acute intracranial hemorrhage, mass effect, or edema.
Gray-white differentiation is preserved. Patchy and confluent areas
of low-density in the supratentorial white matter are nonspecific
but may reflect moderate to marked chronic microvascular ischemic
changes. Prominence of the ventricles and sulci reflects parenchymal
volume loss.

Vascular: There is intracranial atherosclerotic calcification at the
skull base.

Skull: Unremarkable.

Other: Mastoid air cells are clear.

CT MAXILLOFACIAL FINDINGS

Osseous: No acute fracture. Temporomandibular joints are
unremarkable.

Orbits: No intraorbital hematoma.

Sinuses: No significant opacification.

Soft tissues: Right anterior frontal scalp soft tissue swelling.
IMPRESSION: No evidence of acute intracranial injury or facial fracture.

Chronic microvascular ischemic changes.

## 2022-11-07 ENCOUNTER — Other Ambulatory Visit: Payer: Self-pay | Admitting: Family Medicine

## 2022-11-07 DIAGNOSIS — I2510 Atherosclerotic heart disease of native coronary artery without angina pectoris: Secondary | ICD-10-CM

## 2022-11-07 DIAGNOSIS — I10 Essential (primary) hypertension: Secondary | ICD-10-CM

## 2022-11-08 ENCOUNTER — Telehealth: Payer: Self-pay | Admitting: Cardiology

## 2022-11-08 DIAGNOSIS — N181 Chronic kidney disease, stage 1: Secondary | ICD-10-CM

## 2022-11-08 DIAGNOSIS — E1169 Type 2 diabetes mellitus with other specified complication: Secondary | ICD-10-CM

## 2022-11-08 DIAGNOSIS — I152 Hypertension secondary to endocrine disorders: Secondary | ICD-10-CM

## 2022-11-08 DIAGNOSIS — E1122 Type 2 diabetes mellitus with diabetic chronic kidney disease: Secondary | ICD-10-CM

## 2022-11-08 DIAGNOSIS — E785 Hyperlipidemia, unspecified: Secondary | ICD-10-CM

## 2022-11-08 DIAGNOSIS — E1159 Type 2 diabetes mellitus with other circulatory complications: Secondary | ICD-10-CM

## 2022-11-08 NOTE — Telephone Encounter (Signed)
Attempted to call the patient's daughter, [name] (ok per DPR). No answer- unable to leave a message as the voice mail box is full.  I have reviewed Dr. Allison Quarry schedule, but have no later morning appointments at this time.  We can add to cancellation list.  Dr. Ellyn Hack is not scheduling PM clinics in [place] at this time due to performing hospital procedures.  Will attempt to contact Emily at a later time.

## 2022-11-08 NOTE — Telephone Encounter (Signed)
Patient's daughter called to reschedule appointment 12/14, because she states the time is too early. Offered next available for a later time in April and she declined. She also declined to schedule with an APP. She requests a call back from a nurse due to the last appointment being cancelled by the office and believes the patient needs to be seen much sooner and he is having memory issues.

## 2022-11-09 NOTE — Telephone Encounter (Signed)
Attempted to call the patient's daughter.  No answer- unable to leave a message as the voice mail box is full.

## 2022-11-12 ENCOUNTER — Encounter: Payer: Self-pay | Admitting: Physician Assistant

## 2022-11-12 ENCOUNTER — Ambulatory Visit (INDEPENDENT_AMBULATORY_CARE_PROVIDER_SITE_OTHER): Payer: PPO | Admitting: Physician Assistant

## 2022-11-12 ENCOUNTER — Encounter: Payer: Self-pay | Admitting: Podiatry

## 2022-11-12 VITALS — BP 155/91 | HR 102 | Temp 98.1°F | Wt 170.8 lb

## 2022-11-12 DIAGNOSIS — J069 Acute upper respiratory infection, unspecified: Secondary | ICD-10-CM

## 2022-11-12 NOTE — Progress Notes (Signed)
Established patient visit   Patient: Charles Sellers.   DOB: 1932/08/07   88 y.o. Male  MRN: [id number] Visit Date: 11/12/2022  Today's healthcare provider: [name], PA-C   Cc. Cough x 2 days  Subjective    HPI  Cough: Patient complains of cough. Symptoms began 2 days ago. Cough described as non-productive. Associated symptoms include nasal congestion and sneezing. Patient denies chills, eye irritation, and sore throat. Pt has not been checking his temperature. Pt states that symptoms are gradually worsening.  Denies sick contact. Denies sob, wheezing.  Medications: Outpatient Medications Prior to Visit  Medication Sig   ELIQUIS 5 MG TABS tablet TAKE 1 TABLET(5 MG) BY MOUTH TWICE DAILY   furosemide (LASIX) 20 MG tablet Take 1 tablet 2 days per week. May take as needed for swelling.   glucose blood (ONETOUCH ULTRA) test strip USE TO TEST BLOOD SUGAR EVERY DAY AS DIRECTED   LIFESCAN FINEPOINT LANCETS MISC Test fasting blood sugar once each morning.   metFORMIN (GLUCOPHAGE) 500 MG tablet Take 2 tablets (1,000 mg total) by mouth 2 (two) times daily with a meal.   metoprolol succinate (TOPROL-XL) 25 MG 24 hr tablet TAKE 1 TABLET(25 MG) BY MOUTH DAILY   Multiple Vitamins-Minerals (ICAPS AREDS 2 PO) Take 1 capsule by mouth daily.   Multiple Vitamins-Minerals (PRESERVISION AREDS 2) CHEW Chew by mouth.   naproxen sodium (ANAPROX) 220 MG tablet Take 220 mg by mouth daily as needed.   nitroGLYCERIN (NITROSTAT) 0.4 MG SL tablet Place 1 tablet (0.4 mg total) under the tongue every 5 (five) minutes as needed for chest pain.   Omega-3 Fatty Acids (FISH OIL) 1000 MG CAPS Take 1,000 mg by mouth daily.   pantoprazole (PROTONIX) 40 MG tablet TAKE 1 TABLET BY MOUTH ON MONDAY, WEDNESDAY AND FRIDAY OF EACH WEEK   ramipril (ALTACE) 2.5 MG capsule Take 1 capsule (2.5 mg total) by mouth daily. Take with '5mg'$  capsule for 7.'5mg'$  total.   ramipril (ALTACE) 5 MG capsule Take 1 capsule (5 mg total) by  mouth daily. Take with 2.5 mg capsule for 7.'5mg'$  total.   rosuvastatin (CRESTOR) 10 MG tablet Take 1 tablet (10 mg total) by mouth daily.   No facility-administered medications prior to visit.    Review of Systems  Constitutional:  Positive for fatigue. Negative for fever.  HENT:  Positive for congestion, postnasal drip and rhinorrhea.   Respiratory:  Positive for cough. Negative for shortness of breath.   Cardiovascular:  Negative for chest pain, palpitations and leg swelling.  Neurological:  Negative for dizziness and headaches.      Objective    Blood pressure (!) 155/91, pulse (!) 102, temperature 98.1 F (36.7 C), temperature source Oral, weight 170 lb 12.8 oz (77.5 kg), SpO2 98 %.   Physical Exam Constitutional:      General: He is awake.     Appearance: He is well-developed.  HENT:     Head: Normocephalic.  Eyes:     Conjunctiva/sclera: Conjunctivae normal.  Cardiovascular:     Rate and Rhythm: Regular rhythm. Tachycardia present.     Heart sounds: Normal heart sounds.  Pulmonary:     Effort: Pulmonary effort is normal.     Breath sounds: Normal breath sounds.  Skin:    General: Skin is warm.  Neurological:     Mental Status: He is alert and oriented to person, place, and time.  Psychiatric:        Attention and Perception:  Attention normal.        Mood and Affect: Mood normal.        Speech: Speech normal.        Behavior: Behavior is cooperative.     No results found for any visits on 11/12/22.  Assessment & Plan     URI Poc covid negative Educated on viral vs bacterial illness Advised rest, fluids, gave samples of mucinex.  Increase fluids-- electrolyte drinks ie Gatorade, pedialyte If no improvement in 3-5 days please call office.   Return if symptoms worsen or fail to improve.      I, [name], PA-C have reviewed all documentation for this visit. The documentation on  11/12/2022  for the exam, diagnosis, procedures, and orders are all  accurate and complete.  [name], PA-C [facility] [address] [place], [place], [address] Office: [phone] Fax: [place]

## 2022-11-14 ENCOUNTER — Ambulatory Visit: Payer: Self-pay

## 2022-11-14 NOTE — Telephone Encounter (Signed)
Pt called on cell #, no VM set up. Called pt's home # and LVMTCB to discuss sx with a nurse.   Summary: not feeling any better. requesting Rx   Pt stated he was given medication on Monday and was advised if he is still not feeling better to call back. Pt is asking for more medication to be sent in for pick up. Pt could not provide medication name.  Pt declined to provide further details. Pt denied SOB and chest pains. Pt seeking clinical advice.

## 2022-11-14 NOTE — Telephone Encounter (Signed)
2nd attempt. Pt called on both #s, LVMTCB on home #.

## 2022-11-14 NOTE — Telephone Encounter (Signed)
3rd attempt, pt called, unable to reach so will route to provider.  Summary: not feeling any better. requesting Rx    Pt stated he was given medication on Monday and was advised if he is still not feeling better to call back. Pt is asking for more medication to be sent in for pick up. Pt could not provide medication name.  Pt declined to provide further details. Pt denied SOB and chest pains. Pt seeking clinical advice.

## 2022-11-16 ENCOUNTER — Other Ambulatory Visit: Payer: Self-pay | Admitting: Physician Assistant

## 2022-11-16 ENCOUNTER — Ambulatory Visit: Payer: Self-pay | Admitting: *Deleted

## 2022-11-16 MED ORDER — AZITHROMYCIN 250 MG PO TABS
ORAL_TABLET | ORAL | 0 refills | Status: AC
Start: 2022-11-16 — End: 2022-11-21

## 2022-11-16 NOTE — Telephone Encounter (Signed)
  Chief Complaint: patient's daughter Charles Sellers on [place] reports patient not feeling better. Told to call back from OV from 11/12/22 URI dx. Requesting medication  Symptoms: runny nose, sore throat, cough congestion, fever last on Wednesday. Not feeling better  Frequency: since 11/12/22 Pertinent Negatives: Patient denies chest pain no difficulty breathing  Disposition: '[]'$ ED /'[]'$ Urgent Care (no appt availability in office) / '[]'$ Appointment(In office/virtual)/ '[]'$  [facility]/ '[]'$ Home Care/ '[]'$ Refused Recommended Disposition /'[]'$ [place] [facility]/ '[x]'$  Follow-up with PCP Additional Notes:   Please advise call patient's daughter back Charles Sellers [phone] for questions or medication prescribed. Luann reports patient has been taking mucinex liquid for sx . Please advise .    Reason for Disposition  Cough has been present for > 3 weeks    Since 11/12/22  Answer Assessment - Initial Assessment Questions 1. ONSET: "When did the cough begin?"      Last seen by PCP 11/12/22 URI sx and not feeling better per daughter Charles Sellers on D[facility] 2. SEVERITY: "How bad is the cough today?"      Na  3. SPUTUM: "Describe the color of your sputum" (none, dry cough; clear, white, yellow, green)     na 4. HEMOPTYSIS: "Are you coughing up any blood?" If so ask: "How much?" (flecks, streaks, tablespoons, etc.)     na 5. DIFFICULTY BREATHING: "Are you having difficulty breathing?" If Yes, ask: "How bad is it?" (e.g., mild, moderate, severe)    - MILD: No SOB at rest, mild SOB with walking, speaks normally in sentences, can lie down, no retractions, pulse < 100.    - MODERATE: SOB at rest, SOB with minimal exertion and prefers to sit, cannot lie down flat, speaks in phrases, mild retractions, audible wheezing, pulse 100-120.    - SEVERE: Very SOB at rest, speaks in single words, struggling to breathe, sitting hunched forward, retractions, pulse > 120      Denies  6. FEVER: "Do you have a fever?" If Yes, ask: "What is your  temperature, how was it measured, and when did it start?"     Fever on Wednesday 7. CARDIAC HISTORY: "Do you have any history of heart disease?" (e.g., heart attack, congestive heart failure)      na 8. LUNG HISTORY: "Do you have any history of lung disease?"  (e.g., pulmonary embolus, asthma, emphysema)     na 9. PE RISK FACTORS: "Do you have a history of blood clots?" (or: recent major surgery, recent prolonged travel, bedridden)     na 10. OTHER SYMPTOMS: "Do you have any other symptoms?" (e.g., runny nose, wheezing, chest pain)       Runny nose, cough congestion, sore throat. Taking mucinex liquid  11. PREGNANCY: "Is there any chance you are pregnant?" "When was your last menstrual period?"       na 12. TRAVEL: "Have you traveled out of the country in the last month?" (e.g., travel history, exposures)       na  Protocols used: Cough - Acute Non-Productive-A-AH

## 2022-11-16 NOTE — Telephone Encounter (Signed)
RX sent please see other telephone encounter

## 2022-11-16 NOTE — Telephone Encounter (Signed)
Patient advised. Verbalized understanding 

## 2022-11-22 ENCOUNTER — Ambulatory Visit: Payer: PPO | Admitting: Cardiology

## 2022-11-25 ENCOUNTER — Other Ambulatory Visit: Payer: Self-pay | Admitting: Physician Assistant

## 2022-11-25 DIAGNOSIS — E1122 Type 2 diabetes mellitus with diabetic chronic kidney disease: Secondary | ICD-10-CM

## 2022-11-26 LAB — HM DIABETES EYE EXAM

## 2022-12-07 ENCOUNTER — Other Ambulatory Visit: Payer: Self-pay | Admitting: Physician Assistant

## 2022-12-07 DIAGNOSIS — E1121 Type 2 diabetes mellitus with diabetic nephropathy: Secondary | ICD-10-CM

## 2022-12-12 ENCOUNTER — Encounter: Payer: Self-pay | Admitting: Physician Assistant

## 2022-12-12 ENCOUNTER — Ambulatory Visit: Payer: PPO | Attending: Cardiology | Admitting: Cardiology

## 2022-12-12 VITALS — BP 130/80 | HR 93 | Ht 63.0 in | Wt 166.2 lb

## 2022-12-12 DIAGNOSIS — E1169 Type 2 diabetes mellitus with other specified complication: Secondary | ICD-10-CM | POA: Diagnosis not present

## 2022-12-12 DIAGNOSIS — E785 Hyperlipidemia, unspecified: Secondary | ICD-10-CM

## 2022-12-12 DIAGNOSIS — I10 Essential (primary) hypertension: Secondary | ICD-10-CM

## 2022-12-12 DIAGNOSIS — Z9861 Coronary angioplasty status: Secondary | ICD-10-CM

## 2022-12-12 DIAGNOSIS — I50810 Right heart failure, unspecified: Secondary | ICD-10-CM

## 2022-12-12 DIAGNOSIS — I4821 Permanent atrial fibrillation: Secondary | ICD-10-CM | POA: Diagnosis not present

## 2022-12-12 DIAGNOSIS — I2510 Atherosclerotic heart disease of native coronary artery without angina pectoris: Secondary | ICD-10-CM

## 2022-12-12 NOTE — Progress Notes (Signed)
Cardiology Office Note:    Date:  12/12/2022   ID:  Charles Llanos., DOB 1931-12-17, MRN [id number]  PCP:  [name], [place] Providers Cardiologist:  Glenetta Hew, MD     Referring MD: [name], PA-C   Chief Complaint  Patient presents with   Other    6 month f/u c/o sob with exertion and occasional edema. Meds reviewed verbally with pt.    History of Present Illness:    Charles Barlowe. is a 89 y.o. male with a hx of CAD, permanent AF, mitral regurgitation, HLD, and HTN.   Last seen in office by Dr. Ellyn Hack on 05/24/22, he only had reports of increased fatigue and malaise but his daughter was concerned about his decline. Wife has passed in 2021-03-14 and daughter had noticed a steady decline. Echo (06/13/22) was ordered, revealed EF 50-55%, mild LVH. RV mildly enlarged with mod elevated PA systolic pressures (58), LA moderately dilated. He was advised he could increase his lasix to 20 mg daily if needed.   He presents today for a routine follow up of his CAD, his daughter is present with him. He is doing well, overall, from a cardiac perspective. His wife passed in 03/14/2021, and he has been steadily getting used to his new routine. He lives alone, daughter checks on him very frequently - fills his pill box for the week and assesses his fluid status, he is independent and continues to drive. He has resumed his outdoor activities on his property and recently injured his fingers while chopping wood. He does not weigh himself daily secondary to several falls, but his daughter assesses to see if he has pedal edema each week and if she notes any, she will place lasix in his pill box for two days of that week. She states she has not put any lasix in his pillbox for several weeks. His falls seem to be related to him shuffling and tripping. There has never been any associated dizziness, presyncope, or syncope with his falls. He denies hematochezia, hemoptysis or hematuria.     Past Medical History:  Diagnosis Date   Atherosclerosis    CAD S/P percutaneous coronary angioplasty 12/13 & 15 2005   PCI - mRCA (Cypher DES 3.5 mm x 18 mm --> 4.4m); RPL Cypher 2.5 mm x 13 mm;; mLAD Cy a pher DES 3.0 mm x 13 mm (3.5 mm) with Cutting PTCA of D2 ostium;; mid Cx 100% after small OM1.   Cholecystitis    Dermatitis    Diabetes mellitus type II, controlled ([place]    Diet controlled. 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   CORONARY ANGIOPLASTY WITH STENT PLACEMENT  11/2004  a) mRCA Cypher DES 3.5 mm x 18 mm (4.0), RPL Cypher DES 2.5 mm x 13 mm; Staged mLAD 3.0 mm x 13 mm Cypher -> cutting PTCA of jailed D2; previousl PCI of Cx - 100% mid Cx.   LEFT HEART CATH AND CORONARY ANGIOGRAPHY  11/2004   mRCA & RPL as well as LAD '@D2'$  lesions, dLCx CTO (@ previous PCI stie) -> PCI RCA?RPL & staged LAD PCI.   NM MYOVIEW LTD  06/09/2012   Small area of basal inferior infarct with no ischemia. Normal EF   TRANSTHORACIC ECHOCARDIOGRAM  01/10/2013    EF 55%. Mild-moderate MR   TRANSTHORACIC ECHOCARDIOGRAM  11/09/2020   Normal EF 55 to 60%.  Moderate MR with moderate LA dilation.  Mild aortic sclerosis with no stenosis.  Normal RV, RVP and RAP.    Current Medications: Current Meds   Medication Sig   ELIQUIS 5 MG TABS tablet TAKE 1 TABLET(5 MG) BY MOUTH TWICE DAILY   furosemide (LASIX) 20 MG tablet Take 1 tablet 2 days per week. May take as needed for swelling.   LIFESCAN FINEPOINT LANCETS MISC Test fasting blood sugar once each morning.   metFORMIN (GLUCOPHAGE) 500 MG tablet TAKE 2 TABLETS(1000 MG) BY MOUTH TWICE DAILY WITH A MEAL   metoprolol succinate (TOPROL-XL) 25 MG 24 hr tablet TAKE 1 TABLET(25 MG) BY MOUTH DAILY   Multiple Vitamins-Minerals (ICAPS AREDS 2 PO) Take 1 capsule by mouth daily.   Multiple Vitamins-Minerals (PRESERVISION AREDS 2) CHEW Chew by mouth.   naproxen sodium (ANAPROX) 220 MG tablet Take 220 mg by mouth daily as needed.   nitroGLYCERIN (NITROSTAT) 0.4 MG SL tablet Place 1 tablet (0.4 mg total) under the tongue every 5 (five) minutes as needed for chest pain.   ONETOUCH ULTRA test strip USE TO TEST BLOOD SUGAR EVERY DAY AS DIRECTED   pantoprazole (PROTONIX) 40 MG tablet TAKE 1 TABLET BY MOUTH ON MONDAY, WEDNESDAY AND FRIDAY OF EACH WEEK   ramipril (ALTACE) 2.5 MG capsule Take 1 capsule (2.5 mg total) by mouth daily. Take with '5mg'$  capsule for 7.'5mg'$  total.   ramipril (ALTACE) 5 MG capsule Take 1 capsule (5 mg total) by mouth daily. Take with 2.5 mg capsule for 7.'5mg'$  total.   rosuvastatin (CRESTOR) 10 MG tablet Take 1 tablet (10 mg total) by mouth daily.     Allergies:   Niaspan [niacin er] and Penicillins   Social History   Socioeconomic History   Marital status: Widowed    Spouse name: Not on file   Number of children: 4   Years of education: Not on file   Highest education level: High school graduate  Occupational History   Occupation: retired    Comment: previously worked at [employer] Use   Smoking status: Never   Smokeless tobacco: Never  Vaping Use   Vaping Use: Never used  Substance and Sexual Activity   Alcohol use: No    Alcohol/week: 0.0 standard drinks of alcohol   Drug use: No   Sexual activity:  Not on file  Other Topics Concern   Not on file  Social History Narrative   Widowed father of 3, 9 grandchildren, now 62 great-grandchildren. 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   Using the toilet, but does not do routine exercise. Always  on the go.   Never smoked. Does not drink alcohol.   Social Determinants of Health   Financial Resource Strain: Low Risk  (05/17/2022)   Overall Financial Resource Strain (CARDIA)    Difficulty of Paying Living Expenses: Not hard at all  Food Insecurity: No Food Insecurity (05/17/2022)   Hunger Vital Sign    Worried About Running Out of Food in the Last Year: Never true    Ran Out of Food in the Last Year: Never true  Transportation Needs: Unmet Transportation Needs (05/17/2022)   PRAPARE - Hydrologist (Medical): Yes    Lack of Transportation (Non-Medical): Yes  Physical Activity: Insufficiently Active (11/27/2021)   Exercise Vital Sign    Days of Exercise per Week: 2 days    Minutes of Exercise per Session: 60 min  Stress: No Stress Concern Present (05/17/2022)   [place] of Stress : Not at all  Social Connections: Unknown (05/17/2022)   Social Connection and Isolation Panel [NHANES]    Frequency of Communication with Friends and Family: More than three times a week    Frequency of Social Gatherings with Friends and Family: More than three times a week    Attends Religious Services: More than 4 times per year    Active Member of [employer] or Organizations: No    Attends Archivist Meetings: Never    Marital Status: Not on file  Recent Concern: Social Connections - Moderately Isolated (05/17/2022)   Social Connection and Isolation Panel [NHANES]    Frequency of Communication with Friends and Family: More  than three times a week    Frequency of Social Gatherings with Friends and Family: More than three times a week    Attends Religious Services: More than 4 times per year    Active Member of [employer] or Organizations: No    Attends Archivist Meetings: Never    Marital Status: Widowed     Family History: The patient's family history includes Cerebral aneurysm in his daughter and son; Diabetes in his father; Esophageal cancer in his brother; Liver cancer in his father; Melanoma in his daughter; Stroke in his mother.  ROS:   Review of Systems  Constitutional:  Positive for malaise/fatigue (present for some time, naps during the day).  HENT: Negative.    Eyes: Negative.   Respiratory:  Positive for cough (recent URI). Negative for hemoptysis, sputum production, shortness of breath and wheezing.   Cardiovascular:  Positive for leg swelling (rarely). Negative for chest pain, palpitations, orthopnea, claudication and PND.  Gastrointestinal: Negative.   Genitourinary: Negative.   Musculoskeletal:  Positive for falls.  Skin: Negative.   Neurological:  Negative for dizziness, loss of consciousness and headaches.  Endo/Heme/Allergies:  Bruises/bleeds easily.  Psychiatric/Behavioral: Negative.       EKGs/Labs/Other Studies Reviewed:    The following studies were reviewed today:  06/13/22 Echo EF 50-55%, mild LVH. RV mildly enlarged with mod elevated PA systolic pressures (58), LA moderately dilated.   11/09/20 Echo EF 55-60%, normal PA pressures, LA moderately dilated, moderate MVR, mild AVR, mild - mod AV sclerosis.    EKG:  EKG is ordered today.  The ekg ordered today demonstrates atrial fibrillation, HR 93 bpm, consistent with previous tracings.   Recent Labs: 05/14/2022: ALT 16; BUN 11; Creatinine, Ser 0.84; Hemoglobin 13.1; Platelets 159; Potassium 4.7; Sodium 140  Recent Lipid Panel    Component Value  Date/Time   CHOL 122 06/19/2021 1135   TRIG 52 06/19/2021 1135   HDL 36  (L) 06/19/2021 1135   CHOLHDL 3.4 06/19/2021 1135   CHOLHDL 3.2 06/06/2008 2020   VLDL 6 06/06/2008 2020   LDLCALC 74 06/19/2021 1135     Risk Assessment/Calculations:    CHA2DS2-VASc Score = 4   This indicates a 4.8% annual risk of stroke. The patient's score is based upon: CHF History: 1 HTN History: 1 Diabetes History: 0 Stroke History: 0 Vascular Disease History: 0 Age Score: 2 Gender Score: 0               Physical Exam:    VS:  BP 130/80 (BP Location: Left Arm, Patient Position: Sitting, Cuff Size: Normal)   Pulse 93   Ht '5\' 3"'$  (1.6 m)   Wt 166 lb 4 oz (75.4 kg)   SpO2 98%   BMI 29.45 kg/m     Wt Readings from Last 3 Encounters:  12/12/22 166 lb 4 oz (75.4 kg)  11/12/22 170 lb 12.8 oz (77.5 kg)  08/14/22 163 lb 8 oz (74.2 kg)     GEN:  Well nourished, well developed in no acute distress HEENT: Normal NECK: No JVD; No carotid bruits LYMPHATICS: No lymphadenopathy CARDIAC: RRR, murmur noted right sternal border, rubs, gallops RESPIRATORY:  Clear to auscultation without rales, wheezing or rhonchi  ABDOMEN: Soft, non-tender, non-distended MUSCULOSKELETAL:  No edema; No deformity  SKIN: Warm and dry NEUROLOGIC:  Alert and oriented x 3 PSYCHIATRIC:  Normal affect   ASSESSMENT:    1. CAD S/P percutaneous coronary angioplasty   2. Permanent atrial fibrillation ([place])   3. Right-sided heart failure, unspecified HF chronicity ([place])   4. Essential hypertension   5. Hyperlipidemia associated with type 2 diabetes mellitus (HCC)    PLAN:    In order of problems listed above:  CAD s/p percutaneous coronary angioplasty - Denies chest pain or acute decompensation. 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 Continue metoprolol succinate 25 mg daily.  Permanent AF - CHA2DS2-VASc Score = 4 [CHF History: 1, HTN History: 1, Diabetes History: 0, Stroke History: 0, Vascular Disease History: 0, Age Score: 2, Gender Score: 0].  Therefore, the patient's annual  risk of stroke is 4.8 %. Continue Eliquis 5 mg twice daily (no indication for dose reduction), continue metoprolol succinate 25 mg daily.     Right-sided heart failure - Euvolemic today. NYHA class II. Has only needed lasix a few times over the last few weeks. His daughter give lasix to him based on his pedal edema. Continue with low sodium diet. Continue with lasix 20 mg as needed for pedal edema. Cautious use of standing diuretic in order to preserve renal function, could consider SGLT2 at next appointment.  HTN - BP today 134/80, currently well managed. Continue metoprolol succinate 25 mg daily, Altace 7.5 mg daily.  HLD - LDL 52 on 06/19/21, well managed on current regimen of Crestor 20 mg daily. Needs fasting lipid panel, but has eaten today and did not feel like walking through the medical mall another day this week for lab work. He will see if his PCP will check this at his appointment with them later this month.            Medication Adjustments/Labs and Tests Ordered: Current medicines are reviewed at length with the patient today.  Concerns regarding medicines are outlined above.  Orders Placed This Encounter  Procedures   EKG 12-Lead  No orders of the defined types were placed in this encounter.   Patient Instructions  Medication Instructions:  No changes at this time.   *If you need a refill on your cardiac medications before your next appointment, please call your pharmacy*   Lab Work: None  If you have labs (blood work) drawn today and your tests are completely normal, you will receive your results only by: [place] (if you have MyChart) OR A paper copy in the mail If you have any lab test that is abnormal or we need to change your treatment, we will call you to review the results.   Testing/Procedures: None   Follow-Up: At [facility], you and your health needs are our priority.  As part of our continuing mission to provide you with exceptional  heart care, we have created designated Provider Care Teams.  These Care Teams include your primary Cardiologist (physician) and Advanced Practice Providers (APPs -  Physician Assistants and Nurse Practitioners) who all work together to provide you with the care you need, when you need it.   Your next appointment:   6 month(s)  The format for your next appointment:   In Person  Provider:   Glenetta Hew, MD or Christell Faith, PA-C       Important Information About [place]         Signed, Trudi Ida, NP  12/12/2022 5:23 PM    [place]

## 2022-12-12 NOTE — Patient Instructions (Signed)
Medication Instructions:  No changes at this time.   *If you need a refill on your cardiac medications before your next appointment, please call your pharmacy*   Lab Work: None  If you have labs (blood work) drawn today and your tests are completely normal, you will receive your results only by: [place] (if you have MyChart) OR A paper copy in the mail If you have any lab test that is abnormal or we need to change your treatment, we will call you to review the results.   Testing/Procedures: None   Follow-Up: At [facility], you and your health needs are our priority.  As part of our continuing mission to provide you with exceptional heart care, we have created designated Provider Care Teams.  These Care Teams include your primary Cardiologist (physician) and Advanced Practice Providers (APPs -  Physician Assistants and Nurse Practitioners) who all work together to provide you with the care you need, when you need it.   Your next appointment:   6 month(s)  The format for your next appointment:   In Person  Provider:   Glenetta Hew, MD or Christell Faith, PA-C       Important Information About [place]

## 2022-12-13 ENCOUNTER — Telehealth: Payer: Self-pay

## 2022-12-13 NOTE — Telephone Encounter (Signed)
Copied from [place] [id number]. Topic: Appointment Scheduling - Scheduling Inquiry for Clinic >> Dec 13, 2022  4:15 PM [place] wrote: Reason for CRM: Pt's daughter called reporting that she would prefer to have the [place] at the same time as the other appt with his PCP that way she can help answer questions.   She says the patient has dimentia and will not be able to answer questions accurately via telephone by himself. She cannot join the call because of work.   Best contact:  [phone]

## 2022-12-17 ENCOUNTER — Ambulatory Visit (INDEPENDENT_AMBULATORY_CARE_PROVIDER_SITE_OTHER): Payer: PPO

## 2022-12-17 VITALS — Ht 63.0 in | Wt 166.0 lb

## 2022-12-17 DIAGNOSIS — Z0000 Encounter for general adult medical examination without abnormal findings: Secondary | ICD-10-CM

## 2022-12-17 NOTE — Patient Instructions (Signed)
[name] , Thank you for taking time to come for your Medicare Wellness Visit. I appreciate your ongoing commitment to your health goals. Please review the following plan we discussed and let me know if I can assist you in the future.   Screening recommendations/referrals: Colonoscopy: aged out Recommended yearly ophthalmology/optometry visit for glaucoma screening and checkup Recommended yearly dental visit for hygiene and checkup  Vaccinations: Influenza vaccine: n/d Pneumococcal vaccine: 01/09/16 Tdap vaccine: 12/18/19 Shingles vaccine: Shingrix 11/04/19, 01/14/20   Covid-19: 10/13/20, 11/15/20  Advanced directives: yes  Conditions/risks identified: none  Next appointment: Follow up in one year for your annual wellness visit. 12/23/23 @ 1pm by phone  Preventive Care 65 Years and Older, Male Preventive care refers to lifestyle choices and visits with your health care provider that can promote health and wellness. What does preventive care include? A yearly physical exam. This is also called an annual well check. Dental exams once or twice a year. Routine eye exams. Ask your health care provider how often you should have your eyes checked. Personal lifestyle choices, including: Daily care of your teeth and gums. Regular physical activity. Eating a healthy diet. Avoiding tobacco and drug use. Limiting alcohol use. Practicing safe sex. Taking low doses of aspirin every day. Taking vitamin and mineral supplements as recommended by your health care provider. What happens during an annual well check? The services and screenings done by your health care provider during your annual well check will depend on your age, overall health, lifestyle risk factors, and family history of disease. Counseling  Your health care provider may ask you questions about your: Alcohol use. Tobacco use. Drug use. Emotional well-being. Home and relationship well-being. Sexual activity. Eating  habits. History of falls. Memory and ability to understand (cognition). Work and work Statistician. Screening  You may have the following tests or measurements: Height, weight, and BMI. Blood pressure. Lipid and cholesterol levels. These may be checked every 5 years, or more frequently if you are over 87 years old. Skin check. Lung cancer screening. You may have this screening every year starting at age 87 if you have a 30-pack-year history of smoking and currently smoke or have quit within the past 15 years. Fecal occult blood test (FOBT) of the stool. You may have this test every year starting at age 87. Flexible sigmoidoscopy or colonoscopy. You may have a sigmoidoscopy every 5 years or a colonoscopy every 10 years starting at age 87. Prostate cancer screening. Recommendations will vary depending on your family history and other risks. Hepatitis C blood test. Hepatitis B blood test. Sexually transmitted disease (STD) testing. Diabetes screening. This is done by checking your blood sugar (glucose) after you have not eaten for a while (fasting). You may have this done every 1-3 years. Abdominal aortic aneurysm (AAA) screening. You may need this if you are a current or former smoker. Osteoporosis. You may be screened starting at age 87 if you are at high risk. Talk with your health care provider about your test results, treatment options, and if necessary, the need for more tests. Vaccines  Your health care provider may recommend certain vaccines, such as: Influenza vaccine. This is recommended every year. Tetanus, diphtheria, and acellular pertussis (Tdap, Td) vaccine. You may need a Td booster every 10 years. Zoster vaccine. You may need this after age 40. Pneumococcal 13-valent conjugate (PCV13) vaccine. One dose is recommended after age 87. Pneumococcal polysaccharide (PPSV23) vaccine. One dose is recommended after age 87. Talk to your health  care provider about which screenings and  vaccines you need and how often you need them. This information is not intended to replace advice given to you by your health care provider. Make sure you discuss any questions you have with your health care provider. Document Released: 12/23/2015 Document Revised: 08/15/2016 Document Reviewed: 09/27/2015 Elsevier Interactive Patient Education  2017 [place] Prevention in the Home Falls can cause injuries. They can happen to people of all ages. There are many things you can do to make your home safe and to help prevent falls. What can I do on the outside of my home? Regularly fix the edges of walkways and driveways and fix any cracks. Remove anything that might make you trip as you walk through a door, such as a raised step or threshold. Trim any bushes or trees on the path to your home. Use bright outdoor lighting. Clear any walking paths of anything that might make someone trip, such as rocks or tools. Regularly check to see if handrails are loose or broken. Make sure that both sides of any steps have handrails. Any raised decks and porches should have guardrails on the edges. Have any leaves, snow, or ice cleared regularly. Use sand or salt on walking paths during winter. Clean up any spills in your garage right away. This includes oil or grease spills. What can I do in the bathroom? Use night lights. Install grab bars by the toilet and in the tub and shower. Do not use towel bars as grab bars. Use non-skid mats or decals in the tub or shower. If you need to sit down in the shower, use a plastic, non-slip stool. Keep the floor dry. Clean up any water that spills on the floor as soon as it happens. Remove soap buildup in the tub or shower regularly. Attach bath mats securely with double-sided non-slip rug tape. Do not have throw rugs and other things on the floor that can make you trip. What can I do in the bedroom? Use night lights. Make sure that you have a light by your  bed that is easy to reach. Do not use any sheets or blankets that are too big for your bed. They should not hang down onto the floor. Have a firm chair that has side arms. You can use this for support while you get dressed. Do not have throw rugs and other things on the floor that can make you trip. What can I do in the kitchen? Clean up any spills right away. Avoid walking on wet floors. Keep items that you use a lot in easy-to-reach places. If you need to reach something above you, use a strong step stool that has a grab bar. Keep electrical cords out of the way. Do not use floor polish or wax that makes floors slippery. If you must use wax, use non-skid floor wax. Do not have throw rugs and other things on the floor that can make you trip. What can I do with my stairs? Do not leave any items on the stairs. Make sure that there are handrails on both sides of the stairs and use them. Fix handrails that are broken or loose. Make sure that handrails are as long as the stairways. Check any carpeting to make sure that it is firmly attached to the stairs. Fix any carpet that is loose or worn. Avoid having throw rugs at the top or bottom of the stairs. If you do have throw rugs, attach them to  the floor with carpet tape. Make sure that you have a light switch at the top of the stairs and the bottom of the stairs. If you do not have them, ask someone to add them for you. What else can I do to help prevent falls? Wear shoes that: Do not have high heels. Have rubber bottoms. Are comfortable and fit you well. Are closed at the toe. Do not wear sandals. If you use a stepladder: Make sure that it is fully opened. Do not climb a closed stepladder. Make sure that both sides of the stepladder are locked into place. Ask someone to hold it for you, if possible. Clearly mark and make sure that you can see: Any grab bars or handrails. First and last steps. Where the edge of each step is. Use tools that  help you move around (mobility aids) if they are needed. These include: Canes. Walkers. Scooters. Crutches. Turn on the lights when you go into a dark area. Replace any light bulbs as soon as they burn out. Set up your furniture so you have a clear path. Avoid moving your furniture around. If any of your floors are uneven, fix them. If there are any pets around you, be aware of where they are. Review your medicines with your doctor. Some medicines can make you feel dizzy. This can increase your chance of falling. Ask your doctor what other things that you can do to help prevent falls. This information is not intended to replace advice given to you by your health care provider. Make sure you discuss any questions you have with your health care provider. Document Released: 09/22/2009 Document Revised: 05/03/2016 Document Reviewed: 12/31/2014 Elsevier Interactive Patient Education  2017 [employer].

## 2022-12-17 NOTE — Progress Notes (Signed)
Virtual Visit via Telephone Note  I connected with  Charles Sellers. on 12/17/22 at  1:00 PM EST by telephone and verified that I am speaking with the correct person using two identifiers.  Location: Patient: home Provider: [facility] Persons participating in the virtual visit: [place]   I discussed the limitations, risks, security and privacy concerns of performing an evaluation and management service by telephone and the availability of in person appointments. The patient expressed understanding and agreed to proceed.  Interactive audio and video telecommunications were attempted between this nurse and patient, however failed, due to patient having technical difficulties OR patient did not have access to video capability.  We continued and completed visit with audio only.  Some vital signs may be absent or patient reported.   Dionisio David, LPN  Subjective:   Charles Sellers. is a 89 y.o. male who presents for Medicare Annual/Subsequent preventive examination.  Review of Systems     Cardiac Risk Factors include: advanced age (>10mn, >>19women);diabetes mellitus;male gender;hypertension     Objective:    There were no vitals filed for this visit. There is no height or weight on file to calculate BMI.     12/17/2022    1:09 PM 03/16/2022    3:25 PM 11/27/2021    2:25 PM 11/21/2020    2:13 PM 11/17/2019    2:06 PM 10/24/2018    9:20 AM 10/18/2017    9:24 AM  Advanced Directives  Does Patient Have a Medical Advance Directive? Yes No Yes Yes Yes Yes Yes  Type of AParamedicof AOdumLiving will  HSt. JohnLiving will HAftonLiving will Living will;Healthcare Power of ARed BluffLiving will HGraceyLiving will  Does patient want to make changes to medical advance directive? No - Patient declined  Yes (Inpatient - patient defers changing a medical  advance directive and declines information at this time)      Copy of HSeldenin Chart? Yes - validated most recent copy scanned in chart (See row information)  Yes - validated most recent copy scanned in chart (See row information) No - copy requested No - copy requested No - copy requested No - copy requested  Would patient like information on creating a medical advance directive?  No - Patient declined         Current Medications (verified) Outpatient Encounter Medications as of 12/17/2022  Medication Sig   ELIQUIS 5 MG TABS tablet TAKE 1 TABLET(5 MG) BY MOUTH TWICE DAILY   furosemide (LASIX) 20 MG tablet Take 1 tablet 2 days per week. May take as needed for swelling.   LIFESCAN FINEPOINT LANCETS MISC Test fasting blood sugar once each morning.   metFORMIN (GLUCOPHAGE) 500 MG tablet TAKE 2 TABLETS(1000 MG) BY MOUTH TWICE DAILY WITH A MEAL   metoprolol succinate (TOPROL-XL) 25 MG 24 hr tablet TAKE 1 TABLET(25 MG) BY MOUTH DAILY   Multiple Vitamins-Minerals (ICAPS AREDS 2 PO) Take 1 capsule by mouth daily.   Multiple Vitamins-Minerals (PRESERVISION AREDS 2) CHEW Chew by mouth.   naproxen sodium (ANAPROX) 220 MG tablet Take 220 mg by mouth daily as needed.   nitroGLYCERIN (NITROSTAT) 0.4 MG SL tablet Place 1 tablet (0.4 mg total) under the tongue every 5 (five) minutes as needed for chest pain.   ONETOUCH ULTRA test strip USE TO TEST BLOOD SUGAR EVERY DAY AS DIRECTED   pantoprazole (PROTONIX) 40  MG tablet TAKE 1 TABLET BY MOUTH ON MONDAY, WEDNESDAY AND FRIDAY OF EACH WEEK   ramipril (ALTACE) 2.5 MG capsule Take 1 capsule (2.5 mg total) by mouth daily. Take with '5mg'$  capsule for 7.'5mg'$  total.   ramipril (ALTACE) 5 MG capsule Take 1 capsule (5 mg total) by mouth daily. Take with 2.5 mg capsule for 7.'5mg'$  total.   rosuvastatin (CRESTOR) 10 MG tablet Take 1 tablet (10 mg total) by mouth daily.   No facility-administered encounter medications on file as of 12/17/2022.    Allergies  (verified) Niaspan [niacin er] and Penicillins   History: Past Medical History:  Diagnosis Date   Atherosclerosis    CAD S/P percutaneous coronary angioplasty 12/13 & 15 2005   PCI - mRCA (Cypher DES 3.5 mm x 18 mm --> 4.37m); RPL Cypher 2.5 mm x 13 mm;; mLAD Cy a pher DES 3.0 mm x 13 mm (3.5 mm) with Cutting PTCA of D2 ostium;; mid Cx 100% after small OM1.   Cholecystitis    Dermatitis    Diabetes mellitus type II, controlled ([place]    Diet controlled. 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   CORONARY ANGIOPLASTY WITH STENT PLACEMENT  11/2004   a) mRCA Cypher DES 3.5 mm x 18 mm (4.0), RPL Cypher DES 2.5 mm x 13 mm; Staged mLAD 3.0 mm x 13 mm Cypher -> cutting PTCA of jailed D2; previousl PCI of Cx - 100% mid Cx.   LEFT HEART CATH AND CORONARY ANGIOGRAPHY  11/2004   mRCA & RPL as well as LAD '@D2'$  lesions, dLCx CTO (@ previous PCI stie) -> PCI RCA?RPL & staged LAD PCI.   NM MYOVIEW LTD  06/09/2012   Small area of basal inferior infarct with no ischemia. Normal EF   TRANSTHORACIC ECHOCARDIOGRAM  01/10/2013    EF 55%. Mild-moderate MR   TRANSTHORACIC ECHOCARDIOGRAM  11/09/2020   Normal EF 55 to 60%.  Moderate MR with moderate LA dilation.  Mild  aortic sclerosis with no stenosis.  Normal RV, RVP and RAP.   Family History  Problem Relation Age of Onset   Stroke Mother    Liver cancer Father    Diabetes Father    Esophageal cancer Brother    Cerebral aneurysm Daughter    Melanoma Daughter    Cerebral aneurysm Son    Social History   Socioeconomic History   Marital status: Widowed    Spouse name: Not on file   Number of children: 4   Years of education: Not on file   Highest education level: High school graduate  Occupational History   Occupation: retired    Comment: previously worked at [employer]  Use   Smoking status: Never   Smokeless tobacco: Never  Vaping Use   Vaping Use: Never used  Substance and Sexual Activity   Alcohol use: No    Alcohol/week: 0.0 standard drinks of alcohol   Drug use: No   Sexual activity: Not on file  Other Topics Concern   Not on file  Social History Narrative   Widowed father of 3, 9 grandchildren, now 21 great-grandchildren. 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   Using the toilet, but does not do routine exercise. Always on the go.   Never smoked. Does not drink alcohol.   Social Determinants of Health   Financial Resource Strain: Low Risk  (12/17/2022)   Overall Financial Resource Strain (CARDIA)    Difficulty of Paying Living Expenses: Not hard at all  Food Insecurity: No Food Insecurity (12/17/2022)   Hunger Vital Sign    Worried About Running Out of Food in the Last Year: Never true    Ran Out of Food in the Last Year: Never true  Transportation Needs: No Transportation Needs (12/17/2022)   PRAPARE - Hydrologist (Medical): No    Lack of Transportation (Non-Medical): No  Physical Activity: Insufficiently Active (12/17/2022)   Exercise Vital Sign    Days of Exercise per Week: 3 days     Minutes of Exercise per Session: 30 min  Stress: No Stress Concern Present (12/17/2022)   [place]    Feeling of Stress : Not at all  Social Connections: Moderately Isolated (12/17/2022)   Social Connection and Isolation Panel [NHANES]    Frequency of Communication with Friends and Family: More than three times a week    Frequency of Social Gatherings with Friends and Family: More than three times a week    Attends Religious Services: More than 4 times per year    Active Member of [employer] or Organizations: No    Attends Archivist Meetings: Never    Marital Status: Widowed    Tobacco Counseling Counseling given: Not Answered   Clinical Intake:  Pre-visit preparation completed: Yes  Pain : No/denies pain     Nutritional Risks: None Diabetes: Yes CBG done?: No Did pt. bring in CBG monitor from home?: No  How often do you need to have someone help you when you read instructions, pamphlets, or other written materials from your doctor or pharmacy?: 1 - Never  Diabetic?yes Nutrition Risk Assessment:  Has the patient had any N/V/D within the last 2 months?  No  Does the patient have any non-healing wounds?  No  Has the patient had any unintentional weight loss or weight gain?  No   Diabetes:  Is the patient diabetic?  Yes  If diabetic, was a CBG obtained today?  No  Did the patient bring in their glucometer from home?  No  How often do you monitor your CBG's? never.   Financial Strains and Diabetes Management:  Are you having any financial strains with the device, your supplies or your medication? No .  Does the patient want to be seen by Chronic Care Management for management of their diabetes?  No  Would the patient like to be referred to a Nutritionist or for Diabetic Management?  No  Diabetic Exams:  Diabetic Eye Exam: Completed 11/26/22. Marland Kitchen Pt has been advised about the importance in  completing this exam.   Diabetic Foot Exam: Completed 12/07/21. Pt has been advised about the importance in completing this exam.    Interpreter Needed?: No  Information entered by :: Kirke Shaggy, LPN   Activities of Daily Living    12/17/2022    1:11 PM  In your present state of health, do you have any difficulty performing the following activities:  Hearing? 0  Vision? 0  Difficulty concentrating or making decisions? 0  Walking or climbing stairs? 1  Dressing or bathing? 0  Doing errands, shopping? 0  Preparing Food and eating ? N  Using the Toilet? N  In the past six months, have you accidently leaked urine? N  Do you have problems with loss of bowel control? N  Managing your Medications? N  Managing your Finances? N  Housekeeping or managing your Housekeeping? N    Patient Care Team: [name], Hershal Coria as PCP - General (Physician Assistant) Leonie Man, MD as PCP - Cardiology (Cardiology) Birder Robson, MD as Referring Physician (Ophthalmology) Birder Robson, MD as Referring Physician (Ophthalmology) Leonie Man, MD as Consulting Physician (Cardiology) Gardiner Barefoot, DPM as Consulting Physician (Podiatry) Germaine Pomfret, [facility] (Pharmacist)  Indicate any recent Medical Services you may have received from other than [facility] providers in the past year (date may be approximate).     Assessment:   This is a routine wellness examination for Rayhaan.  Hearing/Vision screen Hearing Screening - Comments:: No aids Vision Screening - Comments:: Wears glasses- Eye  Dietary issues and exercise activities discussed: Current Exercise Habits: Home exercise routine, Type of exercise: walking, Time (Minutes): 30, Frequency (Times/Week): 3, Weekly Exercise (Minutes/Week): 90, Intensity: Mild   Goals Addressed             This Visit's Progress    DIET - INCREASE WATER INTAKE         Depression Screen    12/17/2022    1:07 PM 05/17/2022     2:09 PM 11/27/2021    2:23 PM 11/07/2021   10:00 AM 12/05/2020   10:19 AM 11/21/2020    2:11 PM 09/02/2020    8:38 AM  PHQ 2/9 Scores  PHQ - 2 Score 0 0 1 0 0 0 0  PHQ- 9 Score 0  1 1 0  0  Exception Documentation   Patient refusal        Fall Risk    12/17/2022    1:10 PM 08/14/2022   10:02 AM 11/27/2021    2:26 PM 11/07/2021   10:00 AM 12/05/2020   10:19 AM  Fall Risk   Falls in the past year? '1 1 1 '$ 0 1  Number falls in past yr: 1 1 0 0 0  Injury with Fall? 0 1 0 0 0  Risk for fall due to : History of fall(s) History of fall(s) History of fall(s) No Fall Risks   Follow up Falls prevention discussed;Falls evaluation completed Falls evaluation completed Falls prevention discussed Falls evaluation completed     FALL RISK PREVENTION PERTAINING TO THE HOME:  Any stairs in or around the home? Yes  If so, are there any without handrails? No  Home free of loose throw rugs in walkways, pet beds, electrical cords, etc? Yes  Adequate lighting in your home to reduce risk of falls? Yes   ASSISTIVE DEVICES UTILIZED TO PREVENT FALLS:  Life alert? No  Use of a cane, walker or w/c? Yes  Grab bars in the bathroom? Yes  Shower chair or bench in shower? Yes  Elevated toilet seat or a handicapped toilet? Yes    Cognitive Function:        12/17/2022    1:15 PM  6CIT Screen  What Year? 0 points  What month? 0 points  What time? 0 points  Count back from 20 0 points  Months in reverse 0 points  Repeat phrase 0 points  Total Score 0 points    Immunizations Immunization History  Administered Date(s) Administered   Fluad Quad(high Dose 65+) 10/20/2019, 09/01/2020   Influenza, High Dose Seasonal PF 10/18/2017, 10/24/2018   PFIZER(Purple Top)SARS-COV-2 Vaccination 10/13/2020, 11/15/2020   Pneumococcal Conjugate-13 01/09/2016   Pneumococcal Polysaccharide-23 08/04/2008   Tdap 01/09/2016, 12/18/2019   Zoster Recombinat (Shingrix) 11/04/2019, 01/14/2020    TDAP status: Up to  date  Flu Vaccine status: Declined, Education has been provided regarding the importance of this vaccine but patient still declined. Advised may receive this vaccine at local pharmacy or Health Dept. Aware to provide a copy of the vaccination record if obtained from local pharmacy or Health Dept. Verbalized acceptance and understanding.  Pneumococcal vaccine status: Up to date  Covid-19 vaccine status: Completed vaccines  Qualifies for Shingles Vaccine? Yes   Zostavax completed No   Shingrix Completed?: Yes  Screening Tests Health Maintenance  Topic Date Due   INFLUENZA VACCINE  07/10/2022   COVID-19 Vaccine (3 - 2023-24 season) 08/10/2022   FOOT EXAM  12/07/2022   HEMOGLOBIN A1C  02/12/2023   OPHTHALMOLOGY EXAM  11/27/2023   Medicare Annual Wellness (AWV)  12/18/2023   DTaP/Tdap/Td (3 - Td or Tdap) 12/17/2029   Pneumonia Vaccine 52+ Years old  Completed   Zoster Vaccines- Shingrix  Completed   HPV VACCINES  Aged Out    Health Maintenance  Health Maintenance Due  Topic Date Due   INFLUENZA VACCINE  07/10/2022   COVID-19 Vaccine (3 - 2023-24 season) 08/10/2022   FOOT EXAM  12/07/2022    Colorectal cancer screening: No longer required.   Lung Cancer Screening: (Low Dose CT Chest recommended if Age 66-80 years, 30 pack-year currently smoking OR have quit w/in 15years.) does not qualify.   Additional Screening:  Hepatitis C Screening: does not qualify; Completed no  Vision Screening: Recommended annual ophthalmology exams for early detection of glaucoma and other disorders of the eye. Is the patient up to date with their annual eye exam?  Yes  Who is the provider or what is the name of the office in which the patient attends annual eye exams? [facility] If pt is not established with a provider, would they like to be referred to a provider to establish care? No .   Dental Screening: Recommended annual dental exams for proper oral hygiene  Community Resource Referral /  Chronic Care Management: CRR required this visit?  No   CCM required this visit?  No      Plan:     I have personally reviewed and noted the following in the patient's chart:   Medical and social history Use of alcohol, tobacco or illicit drugs  Current medications and supplements including opioid prescriptions. Patient is not currently taking opioid prescriptions. Functional ability and status Nutritional status Physical activity Advanced directives List of other physicians Hospitalizations, surgeries, and ER visits in previous 12 months Vitals Screenings to include cognitive, depression, and falls Referrals and appointments  In addition, I have  reviewed and discussed with patient certain preventive protocols, quality metrics, and best practice recommendations. A written personalized care plan for preventive services as well as general preventive health recommendations were provided to patient.     Dionisio David, LPN   07/11/1571   Nurse Notes: none

## 2022-12-24 ENCOUNTER — Ambulatory Visit (INDEPENDENT_AMBULATORY_CARE_PROVIDER_SITE_OTHER): Payer: PPO | Admitting: Physician Assistant

## 2022-12-24 ENCOUNTER — Encounter: Payer: Self-pay | Admitting: Physician Assistant

## 2022-12-24 VITALS — BP 153/92 | HR 98 | Ht 63.0 in | Wt 166.0 lb

## 2022-12-24 DIAGNOSIS — N181 Chronic kidney disease, stage 1: Secondary | ICD-10-CM

## 2022-12-24 DIAGNOSIS — E785 Hyperlipidemia, unspecified: Secondary | ICD-10-CM

## 2022-12-24 DIAGNOSIS — E1159 Type 2 diabetes mellitus with other circulatory complications: Secondary | ICD-10-CM | POA: Diagnosis not present

## 2022-12-24 DIAGNOSIS — E1122 Type 2 diabetes mellitus with diabetic chronic kidney disease: Secondary | ICD-10-CM

## 2022-12-24 DIAGNOSIS — I50812 Chronic right heart failure: Secondary | ICD-10-CM | POA: Diagnosis not present

## 2022-12-24 DIAGNOSIS — I152 Hypertension secondary to endocrine disorders: Secondary | ICD-10-CM

## 2022-12-24 DIAGNOSIS — E1169 Type 2 diabetes mellitus with other specified complication: Secondary | ICD-10-CM | POA: Diagnosis not present

## 2022-12-24 NOTE — Assessment & Plan Note (Signed)
Elevated in office, managed with ramipril 7 mg. Pt reports high salt diet--eats ham/bacon biscuit sandwiches and puts salt on food Advised to dec. Salt content  Ordered cmp  F/b  cardiology

## 2022-12-24 NOTE — Assessment & Plan Note (Signed)
Last A1c 6.3% ordered today Managed with metformin 1000 mg bid. Cardio considers adding sglt2, we had discussed before and pt does not want to add additional meds. Inquires about coming off of metformin  Foot exam --follows with podiatry Ordered uacr On statin,acei Will follow up with recs pending A1c F/u 67mo

## 2022-12-24 NOTE — Assessment & Plan Note (Signed)
Lasix prn.  Euvolemic today Educated on how to avoid exacerbations in future

## 2022-12-24 NOTE — Assessment & Plan Note (Signed)
Manages with rosuvastatin 10 mg Will repeat lipid panel F/b cardiology

## 2022-12-24 NOTE — Progress Notes (Signed)
Established patient visit   Patient: Charles Sellers.   DOB: November 22, 1932   89 y.o. Male  MRN: [id number] Visit Date: 12/24/2022  Today's healthcare provider: [name], PA-C   Chief Complaint  Patient presents with   Follow-up    Diabetes/ hypertension.   Subjective    HPI  Diabetes Mellitus Type II, Follow-up  Lab Results  Component Value Date   HGBA1C 6.3 (A) 08/14/2022   HGBA1C 7.5 (H) 05/14/2022   HGBA1C 6.9 (A) 01/08/2022   Wt Readings from Last 3 Encounters:  12/24/22 166 lb (75.3 kg)  12/17/22 166 lb (75.3 kg)  12/12/22 166 lb 4 oz (75.4 kg)   Management since then includes metformin 1000 mg BID He reports excellent compliance with treatment. He is not having side effects. Symptoms: No fatigue No foot ulcerations  No appetite changes No nausea  No paresthesia of the feet  No polydipsia  No polyuria No visual disturbances   No vomiting     Home blood sugar records: Not testing regularly, recalls one 146 in AM  Episodes of hypoglycemia? Not testing regularly    Current insulin regiment: none Most Recent Eye Exam: UTD 11/26/22   Pertinent Labs: Lab Results  Component Value Date   CHOL 122 06/19/2021   HDL 36 (L) 06/19/2021   LDLCALC 74 06/19/2021   TRIG 52 06/19/2021   CHOLHDL 3.4 06/19/2021   Lab Results  Component Value Date   NA 140 05/14/2022   K 4.7 05/14/2022   CREATININE 0.84 05/14/2022   EGFR 84 05/14/2022   LABMICR 96.5 05/30/2020   MICRALBCREAT NA 01/02/2018     --------------------------------------------------------------------------------------------------- Hypertension, follow-up  BP Readings from Last 3 Encounters:  12/24/22 (!) 153/92  12/12/22 130/80  11/12/22 (!) 155/91   Wt Readings from Last 3 Encounters:  12/24/22 166 lb (75.3 kg)  12/17/22 166 lb (75.3 kg)  12/12/22 166 lb 4 oz (75.4 kg)      Management since that visit includes ramipril 7.5 mg daily  He reports excellent compliance with  treatment. He is not having side effects.   He is exercising. He does not smoke.   Outside blood pressures are not being checked. Symptoms: No chest pain No chest pressure  No palpitations No syncope  No dyspnea No orthopnea  No paroxysmal nocturnal dyspnea No lower extremity edema   Pertinent labs Lab Results  Component Value Date   CHOL 122 06/19/2021   HDL 36 (L) 06/19/2021   LDLCALC 74 06/19/2021   TRIG 52 06/19/2021   CHOLHDL 3.4 06/19/2021   Lab Results  Component Value Date   NA 140 05/14/2022   K 4.7 05/14/2022   CREATININE 0.84 05/14/2022   EGFR 84 05/14/2022   GLUCOSE 160 (H) 05/14/2022   TSH 1.450 06/19/2021     The ASCVD Risk score (Arnett DK, et al., 2019) failed to calculate for the following reasons:   The 2019 ASCVD risk score is only valid for ages 23 to 92  --------------------------------------------------------------------------------------------------- Lipid/Cholesterol, Follow-up  Last lipid panel Other pertinent labs  Lab Results  Component Value Date   CHOL 122 06/19/2021   HDL 36 (L) 06/19/2021   LDLCALC 74 06/19/2021   TRIG 52 06/19/2021   CHOLHDL 3.4 06/19/2021   Lab Results  Component Value Date   ALT 16 05/14/2022   AST 19 05/14/2022   PLT 159 05/14/2022   TSH 1.450 06/19/2021     Management since that visit includes rosuvastatin 10 mg,  eliquis 5 mg.  He reports excellent compliance with treatment. He is not having side effects.   Symptoms: No chest pain No chest pressure/discomfort  No dyspnea No lower extremity edema  No numbness or tingling of extremity No orthopnea  No palpitations No paroxysmal nocturnal dyspnea  No speech difficulty No syncope    The ASCVD Risk score (Arnett DK, et al., 2019) failed to calculate for the following reasons:   The 2019 ASCVD risk score is only valid for ages 9 to  31  ---------------------------------------------------------------------------------------------------  Medications: Outpatient Medications Prior to Visit  Medication Sig   ELIQUIS 5 MG TABS tablet TAKE 1 TABLET(5 MG) BY MOUTH TWICE DAILY   furosemide (LASIX) 20 MG tablet Take 1 tablet 2 days per week. May take as needed for swelling.   LIFESCAN FINEPOINT LANCETS MISC Test fasting blood sugar once each morning.   metFORMIN (GLUCOPHAGE) 500 MG tablet TAKE 2 TABLETS(1000 MG) BY MOUTH TWICE DAILY WITH A MEAL   metoprolol succinate (TOPROL-XL) 25 MG 24 hr tablet TAKE 1 TABLET(25 MG) BY MOUTH DAILY   Multiple Vitamins-Minerals (ICAPS AREDS 2 PO) Take 1 capsule by mouth daily.   Multiple Vitamins-Minerals (PRESERVISION AREDS 2) CHEW Chew by mouth.   nitroGLYCERIN (NITROSTAT) 0.4 MG SL tablet Place 1 tablet (0.4 mg total) under the tongue every 5 (five) minutes as needed for chest pain.   ONETOUCH ULTRA test strip USE TO TEST BLOOD SUGAR EVERY DAY AS DIRECTED   pantoprazole (PROTONIX) 40 MG tablet TAKE 1 TABLET BY MOUTH ON MONDAY, WEDNESDAY AND FRIDAY OF EACH WEEK   ramipril (ALTACE) 2.5 MG capsule Take 1 capsule (2.5 mg total) by mouth daily. Take with '5mg'$  capsule for 7.'5mg'$  total.   ramipril (ALTACE) 5 MG capsule Take 1 capsule (5 mg total) by mouth daily. Take with 2.5 mg capsule for 7.'5mg'$  total.   rosuvastatin (CRESTOR) 10 MG tablet Take 1 tablet (10 mg total) by mouth daily.   [DISCONTINUED] naproxen sodium (ANAPROX) 220 MG tablet Take 220 mg by mouth daily as needed.   No facility-administered medications prior to visit.      Objective    BP (!) 153/92   Pulse 98   Ht '5\' 3"'$  (1.6 m)   Wt 166 lb (75.3 kg)   SpO2 94%   BMI 29.41 kg/m   Physical Exam Constitutional:      General: He is awake.     Appearance: He is well-developed.  HENT:     Head: Normocephalic.  Eyes:     Conjunctiva/sclera: Conjunctivae normal.  Cardiovascular:     Rate and Rhythm: Normal rate and regular  rhythm.     Heart sounds: Normal heart sounds.  Pulmonary:     Effort: Pulmonary effort is normal.     Breath sounds: Normal breath sounds.  Musculoskeletal:     Right lower leg: No edema.     Left lower leg: No edema.  Skin:    General: Skin is warm.  Neurological:     Mental Status: He is alert and oriented to person, place, and time.  Psychiatric:        Attention and Perception: Attention normal.        Mood and Affect: Mood normal.        Speech: Speech normal.        Behavior: Behavior is cooperative.     No results found for any visits on 12/24/22.  Assessment & Plan     Problem List Items Addressed This Visit  Cardiovascular and Mediastinum   Hypertension associated with diabetes ([place]) (Chronic)    Elevated in office, managed with ramipril 7 mg. Pt reports high salt diet--eats ham/bacon biscuit sandwiches and puts salt on food Advised to dec. Salt content  Ordered cmp  F/b  cardiology      Chronic right-sided heart failure (HCC)    Lasix prn.  Euvolemic today Educated on how to avoid exacerbations in future        Endocrine   Diabetes mellitus type II, controlled ([place]) - Primary (Chronic)    Last A1c 6.3% ordered today Managed with metformin 1000 mg bid. Cardio considers adding sglt2, we had discussed before and pt does not want to add additional meds. Inquires about coming off of metformin  Foot exam --follows with podiatry Ordered uacr On statin,acei Will follow up with recs pending A1c F/u 73mo     Relevant Orders   Comprehensive Metabolic Panel (CMET)   HgB A1c   Urine Microalbumin w/creat. ratio   Vitamin B12   Hyperlipidemia associated with type 2 diabetes mellitus (HCC) (Chronic)    Manages with rosuvastatin 10 mg Will repeat lipid panel F/b cardiology      Relevant Orders   Lipid Profile     Return in about 6 months (around 06/24/2023) for chronic conditions, AVW.      I, LMikey Kirschner PA-C have reviewed all documentation  for this visit. The documentation on  12/24/22 for the exam, diagnosis, procedures, and orders are all accurate and complete.  LMikey Kirschner PA-C [facility] [place] [address]Office: [phone]: [phone]

## 2022-12-25 LAB — LIPID PANEL
Chol/HDL Ratio: 2.9 ratio (ref 0.0–5.0)
Cholesterol, Total: 109 mg/dL (ref 100–199)
HDL: 37 mg/dL — ABNORMAL LOW (ref >39)
LDL Chol Calc (NIH): 61 mg/dL (ref 0–99)
Triglycerides: 43 mg/dL (ref 0–149)
VLDL Cholesterol Cal: 11 mg/dL (ref 5–40)

## 2022-12-25 LAB — HEMOGLOBIN A1C
Est. average glucose Bld gHb Est-mCnc: 154 mg/dL
Hgb A1c MFr Bld: 7 % — ABNORMAL HIGH (ref 4.8–5.6)

## 2022-12-25 LAB — COMPREHENSIVE METABOLIC PANEL
ALT: 19 IU/L (ref 0–44)
AST: 22 IU/L (ref 0–40)
Albumin/Globulin Ratio: 1.7 (ref 1.2–2.2)
Albumin: 4.4 g/dL (ref 3.7–4.7)
Alkaline Phosphatase: 91 IU/L (ref 44–121)
BUN/Creatinine Ratio: 18 (ref 10–24)
BUN: 14 mg/dL (ref 8–27)
Bilirubin Total: 1.3 mg/dL — ABNORMAL HIGH (ref 0.0–1.2)
CO2: 22 mmol/L (ref 20–29)
Calcium: 9 mg/dL (ref 8.6–10.2)
Chloride: 103 mmol/L (ref 96–106)
Creatinine, Ser: 0.78 mg/dL (ref 0.76–1.27)
Globulin, Total: 2.6 g/dL (ref 1.5–4.5)
Glucose: 112 mg/dL — ABNORMAL HIGH (ref 70–99)
Potassium: 4.9 mmol/L (ref 3.5–5.2)
Sodium: 141 mmol/L (ref 134–144)
Total Protein: 7 g/dL (ref 6.0–8.5)
eGFR: 86 mL/min/{1.73_m2} (ref >59)

## 2022-12-25 LAB — MICROALBUMIN / CREATININE URINE RATIO
Creatinine, Urine: 42.3 mg/dL
Microalb/Creat Ratio: 193 mg/g creat — ABNORMAL HIGH (ref 0–29)
Microalbumin, Urine: 81.8 ug/mL

## 2022-12-25 LAB — VITAMIN B12: Vitamin B-12: 538 pg/mL (ref 232–1245)

## 2023-01-15 ENCOUNTER — Ambulatory Visit (INDEPENDENT_AMBULATORY_CARE_PROVIDER_SITE_OTHER): Payer: PPO | Admitting: Podiatry

## 2023-01-15 ENCOUNTER — Encounter: Payer: Self-pay | Admitting: Podiatry

## 2023-01-15 VITALS — BP 147/93 | HR 92

## 2023-01-15 DIAGNOSIS — M79674 Pain in right toe(s): Secondary | ICD-10-CM

## 2023-01-15 DIAGNOSIS — B351 Tinea unguium: Secondary | ICD-10-CM

## 2023-01-15 DIAGNOSIS — M79675 Pain in left toe(s): Secondary | ICD-10-CM

## 2023-01-15 NOTE — Progress Notes (Signed)
   SUBJECTIVE Patient with a history of diabetes mellitus presents to office today complaining of elongated, thickened nails that cause pain while ambulating in shoes.  Patient came into the office yesterday, 05/24/2022, for routine nail debridement performed by Dr. Prudence Davidson but he was unsatisfied with the debridement.  He made an appointment for today.  Patient is unable to trim their own nails. Patient is here for further evaluation and treatment.   Past Medical History:  Diagnosis Date   Atherosclerosis    CAD S/P percutaneous coronary angioplasty 12/13 & 15 2005   PCI - mRCA (Cypher DES 3.5 mm x 18 mm --> 4.26m); RPL Cypher 2.5 mm x 13 mm;; mLAD Cy a pher DES 3.0 mm x 13 mm (3.5 mm) with Cutting PTCA of D2 ostium;; mid Cx 100% after small OM1.   Cholecystitis    Dermatitis    Diabetes mellitus type II, controlled ([place]    Diet controlled. 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 Derm Skin is dry and supple bilateral. Negative open lesions or macerations. Remaining integument unremarkable. Nails are tender, long, thickened and dystrophic with subungual debris, consistent with onychomycosis, 1-5 bilateral. No signs of infection noted. Vasc  DP and PT pedal pulses palpable bilaterally. Temperature  gradient within normal limits.  Neuro Epicritic and protective threshold sensation diminished bilaterally.  Musculoskeletal Exam No symptomatic pedal deformities noted bilateral. Muscular strength within normal limits.  ASSESSMENT 1. Diabetes Mellitus w/ peripheral neuropathy 2.  Pain due to onychomycosis of toenails bilateral  PLAN OF CARE 1. Patient evaluated today. 2. Instructed to maintain good pedal hygiene and foot care. Stressed importance of controlling blood sugar.  3. Mechanical debridement of nails 1-5 bilaterally performed using a nail nipper as a courtesy for the patient. Filed with dremel without incident.  4. Return to clinic in 3 mos.     BEdrick Kins DPM [facility]  Dr. BEdrick Kins DPM    [address] [place] [address]               Office [phone] Fax [phone]

## 2023-01-17 ENCOUNTER — Ambulatory Visit: Payer: PPO | Admitting: Podiatry

## 2023-02-20 DIAGNOSIS — Z7984 Long term (current) use of oral hypoglycemic drugs: Secondary | ICD-10-CM | POA: Diagnosis not present

## 2023-02-20 DIAGNOSIS — M25552 Pain in left hip: Secondary | ICD-10-CM | POA: Diagnosis not present

## 2023-02-20 DIAGNOSIS — E119 Type 2 diabetes mellitus without complications: Secondary | ICD-10-CM | POA: Diagnosis not present

## 2023-02-20 DIAGNOSIS — I4891 Unspecified atrial fibrillation: Secondary | ICD-10-CM | POA: Diagnosis not present

## 2023-02-20 DIAGNOSIS — I2510 Atherosclerotic heart disease of native coronary artery without angina pectoris: Secondary | ICD-10-CM | POA: Diagnosis not present

## 2023-02-20 DIAGNOSIS — S0003XA Contusion of scalp, initial encounter: Secondary | ICD-10-CM | POA: Diagnosis not present

## 2023-02-20 DIAGNOSIS — E785 Hyperlipidemia, unspecified: Secondary | ICD-10-CM | POA: Diagnosis not present

## 2023-02-20 DIAGNOSIS — M25522 Pain in left elbow: Secondary | ICD-10-CM | POA: Diagnosis not present

## 2023-02-20 DIAGNOSIS — Z7901 Long term (current) use of anticoagulants: Secondary | ICD-10-CM | POA: Diagnosis not present

## 2023-02-20 DIAGNOSIS — S60511A Abrasion of right hand, initial encounter: Secondary | ICD-10-CM | POA: Diagnosis not present

## 2023-02-20 DIAGNOSIS — S50312A Abrasion of left elbow, initial encounter: Secondary | ICD-10-CM | POA: Diagnosis not present

## 2023-02-20 DIAGNOSIS — I340 Nonrheumatic mitral (valve) insufficiency: Secondary | ICD-10-CM | POA: Diagnosis not present

## 2023-02-20 DIAGNOSIS — Z79899 Other long term (current) drug therapy: Secondary | ICD-10-CM | POA: Diagnosis not present

## 2023-02-20 DIAGNOSIS — I10 Essential (primary) hypertension: Secondary | ICD-10-CM | POA: Diagnosis not present

## 2023-02-20 DIAGNOSIS — R519 Headache, unspecified: Secondary | ICD-10-CM | POA: Diagnosis not present

## 2023-02-20 DIAGNOSIS — S40812A Abrasion of left upper arm, initial encounter: Secondary | ICD-10-CM | POA: Diagnosis not present

## 2023-04-08 ENCOUNTER — Other Ambulatory Visit: Payer: Self-pay | Admitting: Cardiology

## 2023-04-08 NOTE — Telephone Encounter (Signed)
Pt last saw Wallis Bamberg, [place] on 12/12/22, last labs 12/24/22 Creat 0.78, age 89, weight 75.3kg, based on specified criteria pt is on appropriate dosage of Eliquis 5mg  BID for afib.  Will refill rx.

## 2023-04-15 ENCOUNTER — Ambulatory Visit: Payer: PPO | Admitting: Podiatry

## 2023-04-17 ENCOUNTER — Encounter: Payer: Self-pay | Admitting: Physician Assistant

## 2023-04-17 ENCOUNTER — Other Ambulatory Visit: Payer: Self-pay | Admitting: Physician Assistant

## 2023-04-17 DIAGNOSIS — I4891 Unspecified atrial fibrillation: Secondary | ICD-10-CM | POA: Diagnosis not present

## 2023-04-17 DIAGNOSIS — H9193 Unspecified hearing loss, bilateral: Secondary | ICD-10-CM | POA: Diagnosis not present

## 2023-04-17 DIAGNOSIS — I130 Hypertensive heart and chronic kidney disease with heart failure and stage 1 through stage 4 chronic kidney disease, or unspecified chronic kidney disease: Secondary | ICD-10-CM | POA: Diagnosis not present

## 2023-04-17 DIAGNOSIS — E1122 Type 2 diabetes mellitus with diabetic chronic kidney disease: Secondary | ICD-10-CM

## 2023-04-17 DIAGNOSIS — R296 Repeated falls: Secondary | ICD-10-CM

## 2023-04-17 DIAGNOSIS — D6869 Other thrombophilia: Secondary | ICD-10-CM | POA: Diagnosis not present

## 2023-04-17 DIAGNOSIS — E1136 Type 2 diabetes mellitus with diabetic cataract: Secondary | ICD-10-CM | POA: Diagnosis not present

## 2023-04-17 DIAGNOSIS — I152 Hypertension secondary to endocrine disorders: Secondary | ICD-10-CM

## 2023-04-17 DIAGNOSIS — I509 Heart failure, unspecified: Secondary | ICD-10-CM | POA: Diagnosis not present

## 2023-04-17 DIAGNOSIS — E1169 Type 2 diabetes mellitus with other specified complication: Secondary | ICD-10-CM | POA: Diagnosis not present

## 2023-04-17 DIAGNOSIS — E663 Overweight: Secondary | ICD-10-CM | POA: Diagnosis not present

## 2023-04-17 DIAGNOSIS — I10 Essential (primary) hypertension: Secondary | ICD-10-CM | POA: Diagnosis not present

## 2023-04-17 DIAGNOSIS — E261 Secondary hyperaldosteronism: Secondary | ICD-10-CM | POA: Diagnosis not present

## 2023-04-17 DIAGNOSIS — E785 Hyperlipidemia, unspecified: Secondary | ICD-10-CM | POA: Diagnosis not present

## 2023-04-17 NOTE — Telephone Encounter (Signed)
Spoke to [employer], started referral to [facility] case management

## 2023-04-18 ENCOUNTER — Telehealth: Payer: Self-pay | Admitting: *Deleted

## 2023-04-18 NOTE — Progress Notes (Signed)
  Care Coordination   Note   04/18/2023 Name: Charles Sellers. MRN: [id number] DOB: [birth year]  [name]. is a 89 y.o. year old male who sees Drubel, Lillia Abed, [place] for primary care. I reached out to [name]. by phone today to offer care coordination services.  [name] was given information about Care Coordination services today including:   The Care Coordination services include support from the care team which includes your Nurse Coordinator, Clinical Social Worker, or Pharmacist.  The Care Coordination team is here to help remove barriers to the health concerns and goals most important to you. Care Coordination services are voluntary, and the patient may decline or stop services at any time by request to their care team member.   Care Coordination Consent Status: Patient agreed to services and verbal consent obtained.   Follow up plan:  Telephone appointment with care coordination team member scheduled for:  04/24/2023  Encounter Outcome:  Pt. Scheduled from referral   Burman Nieves, [facility] Care Coordination Care Guide Direct Dial: [phone]

## 2023-04-19 ENCOUNTER — Encounter: Payer: Self-pay | Admitting: Podiatry

## 2023-04-19 ENCOUNTER — Ambulatory Visit (INDEPENDENT_AMBULATORY_CARE_PROVIDER_SITE_OTHER): Payer: HMO | Admitting: Podiatry

## 2023-04-19 VITALS — BP 143/84 | HR 95

## 2023-04-19 DIAGNOSIS — B351 Tinea unguium: Secondary | ICD-10-CM | POA: Diagnosis not present

## 2023-04-19 DIAGNOSIS — M79674 Pain in right toe(s): Secondary | ICD-10-CM

## 2023-04-19 DIAGNOSIS — M79675 Pain in left toe(s): Secondary | ICD-10-CM

## 2023-04-19 NOTE — Progress Notes (Signed)
   Chief Complaint  Patient presents with   Nail Problem    "Cut my toenails."    SUBJECTIVE Patient presents to office today complaining of elongated, thickened nails that cause pain while ambulating in shoes.  Patient is unable to trim their own nails. Patient is here for further evaluation and treatment.  Past Medical History:  Diagnosis Date   Atherosclerosis    CAD S/P percutaneous coronary angioplasty 12/13 & 15 2005   PCI - mRCA (Cypher DES 3.5 mm x 18 mm --> 4.28mm); RPL Cypher 2.5 mm x 13 mm;; mLAD Cy a pher DES 3.0 mm x 13 mm (3.5 mm) with Cutting PTCA of D2 ostium;; mid Cx 100% after small OM1.   Cholecystitis    Dermatitis    Diabetes mellitus type II, controlled (HCC)    Diet controlled. 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 Derm Skin is dry and supple bilateral. Negative open lesions or macerations. Remaining integument unremarkable. Nails are tender, long, thickened and dystrophic with subungual debris, consistent with onychomycosis, 1-5 bilateral. No signs of infection noted. Vasc  DP and PT pedal pulses palpable bilaterally. Temperature gradient within normal limits.  Neuro Epicritic and  protective threshold sensation grossly intact bilaterally.  Musculoskeletal Exam No symptomatic pedal deformities noted bilateral. Muscular strength within normal limits.  ASSESSMENT 1.  Pain due to onychomycosis of toenails both  PLAN OF CARE 1. Patient evaluated today.  2. Instructed to maintain good pedal hygiene and foot care.  3. Mechanical debridement of nails 1-5 bilaterally performed using a nail nipper. Filed with dremel without incident.  4. Return to clinic in 3 mos.    Felecia Shelling, DPM [facility]  Dr. Felecia Shelling, DPM    [address] [place], [place] [address]                Office [phone]  Fax [phone]

## 2023-04-22 ENCOUNTER — Ambulatory Visit: Payer: Self-pay

## 2023-04-22 ENCOUNTER — Telehealth: Payer: PPO

## 2023-04-22 DIAGNOSIS — E1122 Type 2 diabetes mellitus with diabetic chronic kidney disease: Secondary | ICD-10-CM

## 2023-04-22 NOTE — Progress Notes (Signed)
Care Management & Coordination Services Pharmacy Note  04/22/2023 Name:  Charles Sellers. MRN:  [id number] DOB:  [birth year]  Summary: Patient presents for follow-up consult.   Recommendations/Changes made from today's visit: Continue current medications  Follow up plan: CPP follow-up 6 months   Subjective: Charles Sellers. is an 89 y.o. year old male who is a primary patient of Ok Edwards, Lillia Abed, [place].  The care coordination team was consulted for assistance with disease management and care coordination needs.    Engaged with patient by telephone for follow up visit.  Recent office visits: 12/24/22: Patient presented to [name], PA-C for follow-up.  11/22/22: Patient presented to [name], PA-C for URI.  Recent consult visits: 12/12/22: Patient presented to Wallis Bamberg, NP (Cardiology) for follow-up.   Hospital visits: None in previous 6 months   Objective:  Lab Results  Component Value Date   CREATININE 0.78 12/24/2022   BUN 14 12/24/2022   EGFR 86 12/24/2022   GFRNONAA 84 12/05/2020   GFRAA 97 12/05/2020   NA 141 12/24/2022   K 4.9 12/24/2022   CALCIUM 9.0 12/24/2022   CO2 22 12/24/2022   GLUCOSE 112 (H) 12/24/2022    Lab Results  Component Value Date/Time   HGBA1C 7.0 (H) 12/24/2022 11:25 AM   HGBA1C 6.3 (A) 08/14/2022 12:00 PM   HGBA1C 7.5 (H) 05/14/2022 10:45 AM   HGBA1C 7.3 09/01/2021 05:50 PM   MICROALBUR 50 01/02/2018 09:26 AM   MICROALBUR 50 06/18/2017 08:49 AM    Last diabetic Eye exam:  Lab Results  Component Value Date/Time   HMDIABEYEEXA No Retinopathy 11/26/2022 12:00 AM    Last diabetic Foot exam: No results found for: "HMDIABFOOTEX"   Lab Results  Component Value Date   CHOL 109 12/24/2022   HDL 37 (L) 12/24/2022   LDLCALC 61 12/24/2022   TRIG 43 12/24/2022   CHOLHDL 2.9 12/24/2022       Latest Ref Rng & Units 12/24/2022   11:25 AM 05/14/2022   10:45 AM 02/05/2022   10:17 AM  Hepatic Function  Total Protein 6.0 - 8.5  g/dL 7.0  7.0  6.5   Albumin 3.7 - 4.7 g/dL 4.4  4.0  4.0   AST 0 - 40 IU/L 22  19  14    ALT 0 - 44 IU/L 19  16  13    Alk Phosphatase 44 - 121 IU/L 91  130  92   Total Bilirubin 0.0 - 1.2 mg/dL 1.3  1.4  1.1     Lab Results  Component Value Date/Time   TSH 1.450 06/19/2021 11:35 AM   TSH 1.150 01/26/2020 11:04 AM       Latest Ref Rng & Units 05/14/2022   10:45 AM 06/19/2021   11:35 AM 12/05/2020   10:59 AM  CBC  WBC 3.4 - 10.8 x10E3/uL 6.4  7.2  7.7   Hemoglobin 13.0 - 17.7 g/dL 40.9  81.1  91.4   Hematocrit 37.5 - 51.0 % 41.5  44.3  45.5   Platelets 150 - 450 x10E3/uL 159  167  169     Lab Results  Component Value Date/Time   VITAMINB12 538 12/24/2022 11:25 AM   VITAMINB12 370 02/05/2022 10:17 AM    Clinical ASCVD: No  The ASCVD Risk score (Arnett DK, et al., 2019) failed to calculate for the following reasons:   The 2019 ASCVD risk score is only valid for ages 69 to 54       12/17/2022  1:07 PM 05/17/2022    2:09 PM 11/27/2021    2:23 PM  Depression screen PHQ 2/9  Decreased Interest 0 0 0  Down, Depressed, Hopeless 0 0 1  PHQ - 2 Score 0 0 1  Altered sleeping 0  0  Tired, decreased energy 0  0  Change in appetite 0  0  Feeling bad or failure about yourself  0  0  Trouble concentrating 0  0  Moving slowly or fidgety/restless 0  0  Suicidal thoughts 0  0  PHQ-9 Score 0  1  Difficult doing work/chores Not difficult at all  Not difficult at all     Social History   Tobacco Use  Smoking Status Never  Smokeless Tobacco Never   BP Readings from Last 3 Encounters:  04/19/23 (!) 143/84  01/15/23 (!) 147/93  12/24/22 (!) 153/92   Pulse Readings from Last 3 Encounters:  04/19/23 95  01/15/23 92  12/24/22 98   Wt Readings from Last 3 Encounters:  12/24/22 166 lb (75.3 kg)  12/17/22 166 lb (75.3 kg)  12/12/22 166 lb 4 oz (75.4 kg)   BMI Readings from Last 3 Encounters:  12/24/22 29.41 kg/m  12/17/22 29.41 kg/m  12/12/22 29.45 kg/m    Allergies   Allergen Reactions   Niaspan [Niacin Er] Rash   Penicillins Rash    Medications Reviewed Today     Reviewed by Enedina Finner (Podiatric Assistant) on 04/19/23 at 1310  Med List Status: <None>   Medication Order Taking? Sig Documenting Provider Last Dose Status Informant  ELIQUIS 5 MG TABS tablet [id number] Yes TAKE 1 TABLET(5 MG) BY MOUTH TWICE DAILY Marykay Lex, MD Taking Active   furosemide (LASIX) 20 MG tablet [id number] Yes Take 1 tablet 2 days per week. May take as needed for swelling. Marykay Lex, MD Taking Active   [facility] LANCETS MISC [id number] Yes Test fasting blood sugar once each morning. Chrismon, Jodell Cipro, PA-C Taking Active   metFORMIN (GLUCOPHAGE) 500 MG tablet [id number] Yes TAKE 2 TABLETS(1000 MG) BY MOUTH TWICE DAILY WITH A MEAL Drubel, Lillia Abed, PA-C Taking Active   metoprolol succinate (TOPROL-XL) 25 MG 24 hr tablet [id number] Yes TAKE 1 TABLET(25 MG) BY MOUTH DAILY Jacky Kindle, FNP Taking Active   Multiple Vitamins-Minerals (ICAPS AREDS 2 PO) [id number] Yes Take 1 capsule by mouth daily. [provider] Taking Active   Multiple Vitamins-Minerals (PRESERVISION AREDS 2) CHEW [id number] Yes Chew by mouth. [provider] Taking Active   nitroGLYCERIN (NITROSTAT) 0.4 MG SL tablet [id number] Yes Place 1 tablet (0.4 mg total) under the tongue every 5 (five) minutes as needed for chest pain. Chrismon, Jodell Cipro, PA-C Taking Active   [facility] ULTRA test strip [id number] Yes USE TO TEST BLOOD SUGAR EVERY DAY AS DIRECTED Jacky Kindle, FNP Taking Active   pantoprazole (PROTONIX) 40 MG tablet [id number] Yes TAKE 1 TABLET BY MOUTH ON MONDAY, [facility] [place] AND FRIDAY OF [facility] Marykay Lex, MD Taking Active   ramipril (ALTACE) 2.5 MG capsule [id number] Yes Take 1 capsule (2.5 mg total) by mouth daily. Take with 5mg  capsule for 7.5mg  total. Marykay Lex, MD Taking Active   ramipril (ALTACE) 5 MG capsule [id number] Yes Take 1 capsule (5 mg total)  by mouth daily. Take with 2.5 mg capsule for 7.5mg  total. Marykay Lex, MD Taking Active   rosuvastatin (CRESTOR) 10 MG tablet [id number] Yes Take 1 tablet (10 mg total) by mouth daily. [name], PA-C Taking  Active             SDOH:  (Social Determinants of Health) assessments and interventions performed: Yes SDOH Interventions    Flowsheet Row Clinical Support from 12/17/2022 in [facility] Most recent reading at 12/17/2022  1:08 PM Chronic Care Management from 01/16/2022 in [facility] Most recent reading at 01/16/2022 12:01 PM Chronic Care Management from 11/21/2021 in [facility] Most recent reading at 12/07/2021 11:46 AM Clinical Support from 11/27/2021 in [facility] Most recent reading at 11/27/2021  2:24 PM Office Visit from 11/07/2021 in [facility] Most recent reading at 11/07/2021 10:00 AM Clinical Support from 11/21/2020 in [facility] Most recent reading at 11/21/2020  2:11 PM  SDOH Interventions        Food Insecurity Interventions Intervention Not Indicated -- -- Intervention Not Indicated -- --  Housing Interventions Intervention Not Indicated -- -- Intervention Not Indicated -- --  Transportation Interventions Intervention Not Indicated -- -- Intervention Not Indicated -- --  Utilities Interventions Intervention Not Indicated -- -- -- -- --  Alcohol Usage Interventions Intervention Not Indicated (Score <7) -- -- -- -- --  Depression Interventions/Treatment  -- -- -- Patient refuses Treatment PHQ2-9 Score <4 Follow-up Not Indicated --  Financial Strain Interventions Intervention Not Indicated Intervention Not Indicated Intervention Not Indicated  [PAP] Intervention Not Indicated -- --  Physical Activity Interventions Intervention Not Indicated -- -- Intervention Not Indicated -- Patient Refused  Stress  Interventions Intervention Not Indicated -- -- Intervention Not Indicated -- --  Social Connections Interventions Intervention Not Indicated -- -- Intervention Not Indicated -- --       Medication Assistance: None required.  Patient affirms current coverage meets needs.  Medication Access: Within the past 30 days, how often has patient missed a dose of medication? None Is a pillbox or other method used to improve adherence? Yes  Factors that may affect medication adherence? no barriers identified Are meds synced by current pharmacy? No  Are meds delivered by current pharmacy? No  Does patient experience delays in picking up medications due to transportation concerns? No   Compliance/Adherence/Medication fill history: Care Gaps:  COVID-19 Vaccine (3 - 2023-24 season) FOOT EXAM   Star-Rating Drugs:   Assessment/Plan  Hypertension (BP goal <140/90) -Controlled -Current treatment: Metoprolol XL 25 mg daily: Appropriate, Effective, Safe, Accessible Ramipril 7.5 mg daily: Appropriate, Effective, Safe, Accessible  -Medications previously tried: NA  -Current home readings: Does not have home blood pressure monitor.   -Denies hypotensive/hypertensive symptoms -Recommended to continue current medication  Atrial Fibrillation (Goal: prevent stroke and major bleeding) -Controlled -CHADSVASC: 5 -Current treatment: Rate control: Metoprolol XL 25 mg daily: Appropriate, Effective, Safe, Accessible  Anticoagulation: Eliquis 5 mg twice daily: Appropriate, Effective, Safe, Query accessible  -Medications previously tried: NA -asymptomatic A-Fib, denies unusual or concerning bleeding.  -Patient concerned regarding cost of Eliquis, states he is currently able to afford medication. Based on patient income patient will likely need to purchase ~9 months of Eliquis in order to meet out of pocket spending costs. Informed patient of this and to inform me if he enters into [facility].  -Recommended  to continue current medication  Hyperlipidemia: (LDL goal < 70) -Controlled: Not addressed during this visit. -Current treatment: Rosuvastatin 10 mg daily  -Medications previously tried: NA  -Recommended to continue current medication  Diabetes (A1c goal <8%) -Controlled: Not addressed during this visit. -Current  medications: Metformin 1000 mg twice daily: Appropriate, Effective, Safe, Accessible -Medications previously tried: NA  -Current home glucose readings fasting glucose: NA -Denies hypoglycemic/hyperglycemic symptoms -Current meal patterns: Eats at [employer], fast food for most meals.  ham biscuit + hasbrowns  OR Hamburger drinks: diet Pepsi 1 bottle daily, otherwise water  -Current exercise: Minimial -Recommended to continue current medication  GERD (Goal: Prevent reflux) -Controlled: Not addressed during this visit. -Current treatment  Pantoprazole 40 mg 1 tablet daily on Mon/Wed/Fri  -Medications previously tried: NA  -Recommended to continue current medication  Follow Up Plan: Telephone follow up appointment with care management team member scheduled for:  11/04/2023 at 2:00 PM  Angelena Sole, PharmD, Patsy Baltimore, CPP  Clinical Pharmacist Practitioner  [facility] [phone]

## 2023-04-24 ENCOUNTER — Encounter: Payer: Self-pay | Admitting: *Deleted

## 2023-05-01 ENCOUNTER — Ambulatory Visit: Payer: Self-pay | Admitting: *Deleted

## 2023-05-01 DIAGNOSIS — Z7984 Long term (current) use of oral hypoglycemic drugs: Secondary | ICD-10-CM | POA: Diagnosis not present

## 2023-05-01 DIAGNOSIS — M47812 Spondylosis without myelopathy or radiculopathy, cervical region: Secondary | ICD-10-CM | POA: Diagnosis not present

## 2023-05-01 DIAGNOSIS — M25461 Effusion, right knee: Secondary | ICD-10-CM | POA: Diagnosis not present

## 2023-05-01 DIAGNOSIS — R269 Unspecified abnormalities of gait and mobility: Secondary | ICD-10-CM | POA: Diagnosis not present

## 2023-05-01 DIAGNOSIS — S0003XA Contusion of scalp, initial encounter: Secondary | ICD-10-CM | POA: Diagnosis not present

## 2023-05-01 DIAGNOSIS — M5030 Other cervical disc degeneration, unspecified cervical region: Secondary | ICD-10-CM | POA: Diagnosis not present

## 2023-05-01 DIAGNOSIS — E119 Type 2 diabetes mellitus without complications: Secondary | ICD-10-CM | POA: Diagnosis not present

## 2023-05-01 DIAGNOSIS — E785 Hyperlipidemia, unspecified: Secondary | ICD-10-CM | POA: Diagnosis not present

## 2023-05-01 DIAGNOSIS — S0081XA Abrasion of other part of head, initial encounter: Secondary | ICD-10-CM | POA: Diagnosis not present

## 2023-05-01 DIAGNOSIS — M25561 Pain in right knee: Secondary | ICD-10-CM | POA: Diagnosis not present

## 2023-05-01 DIAGNOSIS — M25562 Pain in left knee: Secondary | ICD-10-CM | POA: Diagnosis not present

## 2023-05-01 DIAGNOSIS — I25110 Atherosclerotic heart disease of native coronary artery with unstable angina pectoris: Secondary | ICD-10-CM | POA: Diagnosis not present

## 2023-05-01 DIAGNOSIS — I10 Essential (primary) hypertension: Secondary | ICD-10-CM | POA: Diagnosis not present

## 2023-05-01 DIAGNOSIS — S0990XA Unspecified injury of head, initial encounter: Secondary | ICD-10-CM | POA: Diagnosis not present

## 2023-05-01 DIAGNOSIS — S80211A Abrasion, right knee, initial encounter: Secondary | ICD-10-CM | POA: Diagnosis not present

## 2023-05-01 DIAGNOSIS — Z7901 Long term (current) use of anticoagulants: Secondary | ICD-10-CM | POA: Diagnosis not present

## 2023-05-01 DIAGNOSIS — G319 Degenerative disease of nervous system, unspecified: Secondary | ICD-10-CM | POA: Diagnosis not present

## 2023-05-01 DIAGNOSIS — I4891 Unspecified atrial fibrillation: Secondary | ICD-10-CM | POA: Diagnosis not present

## 2023-05-01 DIAGNOSIS — I2510 Atherosclerotic heart disease of native coronary artery without angina pectoris: Secondary | ICD-10-CM | POA: Diagnosis not present

## 2023-05-01 NOTE — Patient Outreach (Signed)
  Care Coordination   Initial Visit Note   05/01/2023 Name: Sonam Yazzie. MRN: [id number] DOB: 19-Aug-1932  [name]. is a 89 y.o. year old male who sees Drubel, Lillia Abed, [place] for primary care. I spoke with Carlisle Beers, daughter of  Arvis Warmkessel. by phone today.  What matters to the patients health and wellness today?  Have home visits started by [facility] and decrease falls.  She does not have time for full assessment today as patient has fallen again and on the way to the ED.    Goals Addressed             This Visit's Progress    Have patient seen by [facility]       Care Coordination Interventions: Assessed for falls since last encounter         SDOH assessments and interventions completed:  No     Care Coordination Interventions:  Yes, provided   Interventions Today    Flowsheet Row Most Recent Value  Chronic Disease   Chronic disease during today's visit Other  [fall]  General Interventions   General Interventions Discussed/Reviewed General Interventions Reviewed, Communication with  Communication with --  Madaline Guthrie discussed with clinical assistant director, will connect patient with [facility]]       Follow up plan: Follow up call scheduled for 5/24    Encounter Outcome:  Pt. Visit Completed   Kemper Durie, RN, MSN, [facility] Care Management Care Management Coordinator [phone]

## 2023-05-02 ENCOUNTER — Telehealth: Payer: Self-pay

## 2023-05-02 NOTE — Telephone Encounter (Signed)
Copied from CRM [id number]. Topic: General - Other >> May 02, 2023  3:54 PM Turkey B wrote: Reason for CRM: Dr Micael Hampshire from [facility] ER called in about if Dr Ok Edwards will take over pt  home health  for pt. Th order are already done under her name but she is asking for Dr Ok Edwards to take over on this. 3x3

## 2023-05-03 ENCOUNTER — Ambulatory Visit: Payer: Self-pay | Admitting: *Deleted

## 2023-05-03 NOTE — Patient Outreach (Signed)
  Care Coordination   Follow Up Visit Note   05/03/2023 Name: Charles Sellers. MRN: [id number] DOB: 11-03-1932  [name]. is a 89 y.o. year old male who sees Drubel, Lillia Abed, [place] for primary care.   What matters to the patients health and wellness today? Get patient established with [facility].  Collaborated with CCM team, F. McCray, she will follow up with daughter and [facility].    Goals Addressed             This Visit's Progress    COMPLETED: Have patient seen by [facility]   On track    Care Coordination Interventions: Assessed for falls since last encounter  Goal in progress of being met, CCM team with PCP office will complete process of establishing care with [facility]        SDOH assessments and interventions completed:  No     Care Coordination Interventions:  Yes, provided   Interventions Today    Flowsheet Row Most Recent Value  Chronic Disease   Chronic disease during today's visit Other  General Interventions   General Interventions Discussed/Reviewed Communication with  Communication with RN  [Spoke with CCM nurse, she will contact patient's daughter and [facility] directly]       Follow up plan: No further intervention required.   Encounter Outcome:  Pt. Visit Completed   Kemper Durie, RN, MSN, [facility] Care Management Care Management Coordinator [phone]

## 2023-05-07 ENCOUNTER — Telehealth: Payer: Self-pay | Admitting: Physician Assistant

## 2023-05-07 NOTE — Telephone Encounter (Signed)
Patients daughter declined referral to outpatient PT at this time

## 2023-05-07 NOTE — Telephone Encounter (Signed)
Soni with [facility], pt was seen for PT assessment today and they will not be admitting him because he is not home bound.   Soni phone number:[phone]

## 2023-05-15 ENCOUNTER — Other Ambulatory Visit: Payer: Self-pay | Admitting: Cardiology

## 2023-05-15 DIAGNOSIS — I152 Hypertension secondary to endocrine disorders: Secondary | ICD-10-CM

## 2023-06-02 ENCOUNTER — Other Ambulatory Visit: Payer: Self-pay | Admitting: Physician Assistant

## 2023-06-02 DIAGNOSIS — E1122 Type 2 diabetes mellitus with diabetic chronic kidney disease: Secondary | ICD-10-CM

## 2023-06-27 ENCOUNTER — Encounter: Payer: Self-pay | Admitting: Cardiology

## 2023-06-27 ENCOUNTER — Other Ambulatory Visit
Admission: RE | Admit: 2023-06-27 | Discharge: 2023-06-27 | Disposition: A | Discharge status: Home / Self Care | Payer: HMO | Source: Ambulatory Visit | Attending: Cardiology | Admitting: Cardiology

## 2023-06-27 ENCOUNTER — Ambulatory Visit: Payer: HMO | Attending: Cardiology | Admitting: Cardiology

## 2023-06-27 VITALS — BP 118/80 | HR 91 | Ht 63.0 in | Wt 156.5 lb

## 2023-06-27 DIAGNOSIS — I2510 Atherosclerotic heart disease of native coronary artery without angina pectoris: Secondary | ICD-10-CM | POA: Diagnosis not present

## 2023-06-27 DIAGNOSIS — I4821 Permanent atrial fibrillation: Secondary | ICD-10-CM | POA: Diagnosis not present

## 2023-06-27 DIAGNOSIS — E1159 Type 2 diabetes mellitus with other circulatory complications: Secondary | ICD-10-CM | POA: Diagnosis not present

## 2023-06-27 DIAGNOSIS — R350 Frequency of micturition: Secondary | ICD-10-CM | POA: Diagnosis not present

## 2023-06-27 DIAGNOSIS — I350 Nonrheumatic aortic (valve) stenosis: Secondary | ICD-10-CM | POA: Diagnosis not present

## 2023-06-27 DIAGNOSIS — I340 Nonrheumatic mitral (valve) insufficiency: Secondary | ICD-10-CM | POA: Diagnosis not present

## 2023-06-27 DIAGNOSIS — E1169 Type 2 diabetes mellitus with other specified complication: Secondary | ICD-10-CM

## 2023-06-27 DIAGNOSIS — D6869 Other thrombophilia: Secondary | ICD-10-CM

## 2023-06-27 DIAGNOSIS — G4710 Hypersomnia, unspecified: Secondary | ICD-10-CM

## 2023-06-27 DIAGNOSIS — E785 Hyperlipidemia, unspecified: Secondary | ICD-10-CM

## 2023-06-27 DIAGNOSIS — I10 Essential (primary) hypertension: Secondary | ICD-10-CM

## 2023-06-27 DIAGNOSIS — R296 Repeated falls: Secondary | ICD-10-CM

## 2023-06-27 DIAGNOSIS — I152 Hypertension secondary to endocrine disorders: Secondary | ICD-10-CM

## 2023-06-27 DIAGNOSIS — I50812 Chronic right heart failure: Secondary | ICD-10-CM

## 2023-06-27 DIAGNOSIS — Z9861 Coronary angioplasty status: Secondary | ICD-10-CM

## 2023-06-27 DIAGNOSIS — Z7189 Other specified counseling: Secondary | ICD-10-CM

## 2023-06-27 DIAGNOSIS — I4891 Unspecified atrial fibrillation: Secondary | ICD-10-CM | POA: Insufficient documentation

## 2023-06-27 MED ORDER — METOPROLOL SUCCINATE ER 50 MG PO TB24
50.0000 mg | ORAL_TABLET | Freq: Every day | ORAL | 3 refills | Status: DC
Start: 2023-06-27 — End: 2023-08-13

## 2023-06-27 NOTE — Patient Instructions (Addendum)
Medication Instructions:  Your physician has recommended you make the following change in your medication:   STOP - ELIQUIS 5 MG TABS tablet  STOP - ramipril (ALTACE) 2.5 MG capsule  START - metoprolol succinate (TOPROL-XL) 50 MG 24 hr tablet - Take 1 tablet (50 mg total) by mouth daily  *If you need a refill on your cardiac medications before your next appointment, please call your pharmacy*  Lab Work: Sales executive at [facility] 1st desk on the right to check in (REGISTRATION)  Lab hours: Monday- Friday (7:30 am- 5:30 pm)   Urinalysis       Urine Culture      Testing/Procedures: -None ordered  Follow-Up: At [facility], you and your health needs are our priority.  As part of our continuing mission to provide you with exceptional heart care, we have created designated Provider Care Teams.  These Care Teams include your primary Cardiologist (physician) and Advanced Practice Providers (APPs -  Physician Assistants and Nurse Practitioners) who all work together to provide you with the care you need, when you need it.  Your next appointment:   1 month(s)  Provider:   You may see Bryan Lemma, MD or one of the following Advanced Practice Providers on your designated Care Team:   Nicolasa Ducking, NP Eula Listen, PA-C Cadence Fransico Michael, PA-C Charlsie Quest, NP    Other Instructions -Ambulatory referral to Neurology - Melvyn Novas, MD for hypersomnolence disorder -Recommend stop driving -Recommend getting bedside commode and not using restroom with a bathtub installed

## 2023-06-27 NOTE — Progress Notes (Unsigned)
Cardiology Office Note:  .   Date:  06/29/2023  ID:  Charles Arabia., DOB 1932-06-27, MRN [id number] PCP: Charles Ferguson, PA-C  [facility] Providers Cardiologist:  Charles Lemma, MD     Chief Complaint  Patient presents with   6 month follow up     Patient c/o frequent urination more than usual, falling asleep during the day and is concerned with him driving if he's not falling asleep at the stop light,  shortness of breath, dizziness and tiredness. Medications reviewed by the patient verbally.    Coronary Artery Disease    No active angina   Atrial Fibrillation    No sense of A-fib.  No heart failure.    History of Present Illness: .     Charles Sellers. is a  89 y.o. male  with a PMH notable for CAD (PCI), permanent A-fib (on Eliquis), MR, DM-2, HTN, HLD who presents here for ~6 month f/u at the request of Charles Ferguson, PA-C.  Charles Sellers is a former patient of Dr. Susa Sellers with CAD and moderate mitral regurgitation. December 2005: Staged two-vessel DES PCI --> initial PCI of the mid RCA with a 3.5 x 18 mm Cypher DES (-> 4.0 mm),  RPL - Cypher DES 2.5 mm x 13 mm  Staged PCI of the LAD at D2 with a Cypher DES 3.0 mm 30 mm (-> 3.68mm) w/ provisional PTCA of D2 ostium 2/2 plaque shift.  CTO dCx ==> Med Rx.  Follow-up Myoviews (most recent June 2013) --> nonischemic.  Small basal inferior infarct. TTE 08/07/2018: Normal LV function EF 55 to 60%.  No R WMA.  GR 1 DD.  Mild aortic stenosis with trivial regurgitation.  Moderate mitral regurgitation with mildly dilated left atrium.  Charles Sellers. was last seen on December 12, 2022 by Charles Bamberg, NP (I last saw him in June 2023).  Still getting used to the routine of being without his wife who died in 01-Aug-2021.  Lives alone.  Daughter checks on him routinely.  She helps fill his pillboxes.  Still living independently and trying to continue driving.  Does outdoor activities on his property.  He had just injured his hand.  Does  not weigh himself daily.  Has had several falls.  Daughter places Lasix for 2 days a week.  But has not done so in several weeks.  Fall STEMI related to shuffling and tripping.  No syncope or near-syncope.  Noted fatigue daytime naps.  Easy bruising. Continue Eliquis. Noted NYHA class II right-sided HF.  Using Lasix couple times a week.  Uses for pedal edema-PRN.  BP stable  Recommended 13-month follow-up   Subjective  INTERVAL HISTORY Charles Sellers 02/20/2023 -> fall with head/scalp contusion.  Noted left hip pain left side of head pain and left elbow pain.  Had a mechanical fall backwards onto her left side after tripping while working outside.  Hit his head.  No syncope or near syncope.  No LOC. Charles Sellers 05/01/2023 -> again fall.  Fell asleep while sitting up in a stool fell forward hitting his head and nose on the concrete floor.  No LOC. Daughter present in ED with patient. Patient reports he is independent with self-care. Lives alone. Ambulates most of the time without an AD. Occasionally uses walking stick outdoors. Patient drives short distances. Patient presents to PT with history of 4-5 falls within the past 12 months. Patient able to perform 4-Stage balance test. Position  1 and 2 pt able to stand10 seconds and steady. Position 2 only 2 seconds with LOB with CGA to maintain balance. Unable to perform position 4. Thirty-second sit<>stand 6 reps, less than 8 for his age is below average score and indicates risk of falls. Timed Up and Go(TUG) 30 seconds, less than 12 seconds indicate patient is at risk for falling. Patient has a shuffling gait. Decreased hip functional strength. After clinical presentation, contributing co-morbidities, review of body systems and exam findings, patient presents as low complexity case. AM-PAC raw score of 18/20, patient is 29.65% impaired in basic mobility. PT recommended rollator or RW but patient refused. Patient states he will not use it. He uses  walking stick as needed. No acute care needed. Recommend 3x post acute care to maximize functional mobility and safety.  Recommended PT  Charles Arabia. presents here today accompanied by his daughter and.  They have mentioned that he has had at least 4 falls in the last several months as recent as today.  Most of these have been mechanical falls likely related to shuffling and unsteady gait.  He also has had profound dizziness.  His blood sugars been up and down and is probably not really paying attention to his diet with pretty significant dietary discretions.  Overall they have noted a steady decline over the last year or so.  He has some exertional dyspnea but mostly just he has began to slow down.  He does some of the basic house chores but for the most part he is family helps out doing most of the chores.  He uses a cane or walking stick sometimes walking but has not a doctor using a walker.  No chest pain or pressure with rest or exertion.  No signs or symptoms of arrhythmia, digestive dizziness that is worse with changing position.  He was not orthostatic today as borderline fast heart rates.  A-fib rate is not fully controlled.  However, he does not have any sensation of irregular heartbeats or palpitations.  His swelling is well-controlled with no PND orthopnea-and his daughter is actually only giving him half tablet of furosemide 2 days a week.Marland Kitchen  He sleeps on 1 pillow.  Maybe 1 or 2 times a night nocturia.    The concerning thing is that he is actually fallen asleep while on the commode at night and is falling forward.  He also has had several times during the course of the day when we will fall asleep--this is even occurred while he is at a stoplight or stop sign driving his car.  Thankfully, it does not happen while he is actually driving the car.  Interestingly, he still insists on having his independence and wants to build drive his car.  He will drive his car to his friend's house and  then fall asleep restaurant cells.  He likes to be able to drive himself to the store and to church, but the family is very concerned about him driving and is hoping that he will give up the keys.  Unfortunate, he is pretty defiant and this is causing quite a bit of stress because he feels against taking away his independence.   ROS:  Review of Systems - Negative except symptoms noted above; also weight loss, decreased p.o. intake.  Not hydrating and eating as well.  Generalized weakness Musculoskeletal ROS: positive for - joint pain, joint stiffness, and slower more shuffling gait. Neurological ROS: Positive for dizziness that can be with or without  changing position.  Hypersomnolence during the day.  Slow unsteady gait; dizziness/poor balance; some memory loss..    Objective  Studies Reviewed: Marland Kitchen   EKG Interpretation Date/Time:  Thursday June 27 2023 09:33:50 EDT Ventricular Rate:  91 PR Interval:    QRS Duration:  144 QT Interval:  418 QTC Calculation: 514 R Axis:   -4  Text Interpretation: Atrial fibrillation Right bundle branch block Possible Inferior infarct (cited on or before 23-Nov-2004) nsclt (scanned to chart) Confirmed by Charles Sellers ([id number]) on 06/27/2023 9:55:18 AM   ECHO 06/13/2022: EF 50 to 55%.  Low normal.  No RWMA.  Mild LVH.  Indeterminate DFxn, but moderate evaluate LA would suggest at least Grade 1 DD.  Normal RV size.  Mildly enlarged but moderate to large PA.  PAP estimated at 58 mmHg and mild to moderately dilated RA.  RAP estimated 15 mmHg.  Mild calcific AS.  Moderate MR.  Mild to moderate TR LABS Lab Results  Component Value Date   CHOL 109 12/24/2022   HDL 37 (L) 12/24/2022   LDLCALC 61 12/24/2022   TRIG 43 12/24/2022   CHOLHDL 2.9 12/24/2022   Lab Results  Component Value Date   NA 141 12/24/2022   CL 103 12/24/2022   K 4.9 12/24/2022   CO2 22 12/24/2022   BUN 14 12/24/2022   CREATININE 0.78 12/24/2022   EGFR 86 12/24/2022   CALCIUM 9.0 12/24/2022    ALBUMIN 4.4 12/24/2022   GLUCOSE 112 (H) 12/24/2022   Lab Results  Component Value Date   HGBA1C 7.0 (H) 12/24/2022       Risk Assessment/Calculations:    CHA2DS2-VASc Score = 5   This indicates a 7.2% annual risk of stroke. The patient's score is based upon: CHF History: 1 HTN History: 1 Diabetes History: 0 Stroke History: 0 Vascular Disease History: 1 Age Score: 2 Gender Score: 0    No data recorded     Physical Exam:   VS:  BP 118/70   Pulse 91   Ht 5\' 3"  (1.6 m)   Wt 156 lb 8 oz (71 kg)   SpO2 97%   BMI 27.72 kg/m    Wt Readings from Last 3 Encounters:  06/27/23 156 lb 8 oz (71 kg)  12/24/22 166 lb (75.3 kg)  12/17/22 166 lb (75.3 kg)    Orthostatic VS for the past 24 hrs (Last 3 readings):  BP- Lying Pulse- Lying BP- Sitting Pulse- Sitting BP- Standing at 0 minutes Pulse- Standing at 0 minutes BP- Standing at 3 minutes Pulse- Standing at 3 minutes  06/27/23 0936 134/87 101 140/86 92 146/86 101 149/87 103    GEN: Very pleasant elderly gentleman.  He is well nourished, well developed in no acute distress; relatively well-groomed. NECK: No carotid bruits; can A waves noted but no true JVD. CARDIAC: Irregularly irregular rhythm.  Normal rate.  Normal S1, split S2.  1/6 HSM at r apex and 1/6 SEM at RUSB. RESPIRATORY:  Clear to auscultation without rales, wheezing or rhonchi ; nonlabored, good air movement. ABDOMEN: Soft, non-tender, non-distended, mild abdominal/truncal obesity. EXTREMITIES: Trivial bilateral ankle edema; No deformity; slow, unsteady gait.     ASSESSMENT AND PLAN: .    Problem List Items Addressed This Visit       Cardiology Problems   Permanent atrial fibrillation (HCC); CHA2DS2-VASc score 5 - Primary (Chronic)    A-fib rate is a little high still.  I think for now because of his dizziness will reduce his ACE  inhibitor dose down to 5 mg which would allow [place] to increase his Toprol to 50 mg.  This will only prove his heart rate.  Need to  monitor for bradycardia.  We had a long discussion about all of his falls and concerns for potential significant bleed on Eliquis.  We decided that until the cause of his falls and frequency has reduced, with prefer for him to not be on Eliquis avoid potential head bleed since he has had at least 2 falls where he has hit his head.  Plan: Increase Toprol to 50 mg daily, stop Eliquis, reduce Altace to 5 mg daily.      Relevant Medications   furosemide (LASIX) 20 MG tablet   metoprolol succinate (TOPROL-XL) 50 MG 24 hr tablet   Other Relevant Orders   EKG 12-Lead (Completed)   Mitral regurgitation (Chronic)    Moderate MR noted on follow-up echo in July 2023.  Also mild to moderate TR.  He has no desire to undergo any invasive options to treat.  For now we will just continue to monitor.  Quite likely that some of the existing MR is functional in the setting of A-fib.      Relevant Medications   furosemide (LASIX) 20 MG tablet   metoprolol succinate (TOPROL-XL) 50 MG 24 hr tablet   Mild aortic stenosis (Chronic)    Trivial on previous echo.      Relevant Medications   furosemide (LASIX) 20 MG tablet   metoprolol succinate (TOPROL-XL) 50 MG 24 hr tablet   Other Relevant Orders   EKG 12-Lead (Completed)   Hypertension associated with diabetes (HCC) (Chronic)    Interestingly, his orthostatic pressures are much higher than his baseline pressure.  I am fine as long as his blood pressures are below 180 systolic for every.  Would need to slow his heart rate down further and avoid orthostatic hypotension, will reduce Altace to total of 5 mg daily from 7.5 and increase Toprol to 50 mg daily.      Relevant Medications   furosemide (LASIX) 20 MG tablet   metoprolol succinate (TOPROL-XL) 50 MG 24 hr tablet   Hyperlipidemia associated with type 2 diabetes mellitus (HCC) (Chronic)    On rosuvastatin 10 mg daily.  Lipids to look well-controlled. Last A1c was 7.0 and he is only on metformin.   Could consider SGLT2 inhibitor which allows potentially stop the Lasix.  Would not do a GLP-1 agonist as it would increase the risk dehydration.      Relevant Medications   furosemide (LASIX) 20 MG tablet   metoprolol succinate (TOPROL-XL) 50 MG 24 hr tablet   Chronic right-sided heart failure (HCC)    Despite having elevated pressures on echocardiogram, he is not having any real symptoms of exertional dyspnea.  Only mild edema that was controlled with very low-dose of Lasix 10 mg maybe 2 days a week. On ACE inhibitor for echo reduction along with beta-blocker to increase the dose.      Relevant Medications   furosemide (LASIX) 20 MG tablet   metoprolol succinate (TOPROL-XL) 50 MG 24 hr tablet   CAD S/P percutaneous coronary angioplasty (Chronic)    PCI.  No further angina.  Myoview in 2013 was nonischemic.  On beta-blocker  He is currently on Eliquis which we can stop.  Will probably consider adding aspirin, if his frequency of falls go down..       Relevant Medications   furosemide (LASIX) 20 MG tablet   metoprolol succinate (  TOPROL-XL) 50 MG 24 hr tablet   Other Relevant Orders   EKG 12-Lead (Completed)     Other   Hypersomnolence disorder, persistent    I am little concerned about his hypersomnolence.  I am concerned about Impella sleep with driving as well.  This is a major area of concern for them as it is leading to falls. I do not know if this is related to sleep hygiene or being potentially OSA.  I will refer him to Dr. Vickey Huger from neurology sleep medicine.      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  We talked about options understanding there is a risk of stroke versus head bleed.  With him having all these falls until they  are somewhat resolved I think the best thing to do is for him to stop Eliquis.  If doing okay after month or so without any follow-up to consider adding 81 mg aspirin.      Frequent urination   Relevant Orders   Urinalysis   Urine Culture (Completed)   Frequent falls (Chronic)    I also agree with using a cane and a walker.  I also think with this in mind I think we have to consider the safety of him continuing to drive. Maybe he needs vestibular PT.  Referring to neurology.  Because of multiple falls, we are holding/stopping DOAC to avoid excess bleeding concern.  I also recommended using a bedside commode which would obviate the need for her to walk back and forth to the bathroom at night.  I also said that when he uses the bathroom he should not use the 1 that has the tub in front of the toilet.  There is a smaller bathroom where he would only fall against the wall.      Relevant Orders   Ambulatory referral to Neurology   Counseling for living will    We talked about goals of care counseling.  Briefly discussed the topic of CODE STATUS.  Also discussed the issue with him continuing to drive.  While it may be safe for him to drive short distances in a local community, I am very fearful of being on roads.  I tend to agree with his daughter that it is probably best that he does not drive.  Unfortunately this would take away his independence which is a very major concern of his.  I do not feel like this is a decision that needs to make correct this time, but it is something that certainly needs to be taken to consideration going forward. Gunther is a vehemently independent gentleman who seems to be very stubbornly sticking to his freedoms.  He does not seem to be as worried about issues.  I tried to bring his attention to the thought that he could potentially fall asleep while driving and toss the life of somebody who has not lived 89 years.  This seemed to have a more profound close on him.   Unable to get under advisement.       Other Visit Diagnoses     Essential hypertension       Relevant Medications   furosemide (LASIX) 20 MG tablet   metoprolol succinate (TOPROL-XL) 50 MG 24 hr tablet  Other Relevant Orders   EKG 12-Lead (Completed)       STOP - ELIQUIS 5 MG TABS tablet  STOP - ramipril (ALTACE) 2.5 MG capsule  START - metoprolol succinate (TOPROL-XL) 50 MG 24 hr tablet - Take 1 tablet (50 mg total) by mouth daily  Check UA & Cx - frequent urination   -Ambulatory referral to Neurology - Melvyn Novas, MD for hypersomnolence disorder -Recommend stop driving -Recommend getting bedside commode and not using restroom with a bathtub installed     Dispo: Return in about 1 month (around 07/27/2023) for Routine Follow-up after testing ~ 1-2 months, Follow-up in [place].  Total time spent: 47 min spent with patient + 29 min spent charting = 76 min     Signed, Charles Lex, MD, MS Charles Sellers, M.D., M.S. Interventional Cardiologist  [facility]  Pager # [phone] Phone # [phone] [address] [place], [place] [address]

## 2023-06-28 LAB — URINE CULTURE: Culture: <10000 — AB

## 2023-06-29 ENCOUNTER — Encounter: Payer: Self-pay | Admitting: Cardiology

## 2023-06-29 DIAGNOSIS — Z7189 Other specified counseling: Secondary | ICD-10-CM | POA: Insufficient documentation

## 2023-06-29 NOTE — Assessment & Plan Note (Signed)
PCI.  No further angina.  Myoview in 2013 was nonischemic.  On beta-blocker  He is currently on Eliquis which we can stop.  Will probably consider adding aspirin, if his frequency of falls go down.Marland Kitchen

## 2023-06-29 NOTE — Assessment & Plan Note (Addendum)
On rosuvastatin 10 mg daily.  Lipids to look well-controlled. Last A1c was 7.0 and he is only on metformin.  Could consider SGLT2 inhibitor which allows potentially stop the Lasix.  Would not do a GLP-1 agonist as it would increase the risk dehydration.

## 2023-06-29 NOTE — Assessment & Plan Note (Signed)
Moderate MR noted on follow-up echo in July 2023.  Also mild to moderate TR.  He has no desire to undergo any invasive options to treat.  For now we will just continue to monitor.  Quite likely that some of the existing MR is functional in the setting of A-fib.

## 2023-06-29 NOTE — Assessment & Plan Note (Addendum)
I also agree with using a cane and a walker.  I also think with this in mind I think we have to consider the safety of him continuing to drive. Maybe he needs vestibular PT.  Referring to neurology.  Because of multiple falls, we are holding/stopping DOAC to avoid excess bleeding concern.  I also recommended using a bedside commode which would obviate the need for her to walk back and forth to the bathroom at night.  I also said that when he uses the bathroom he should not use the 1 that has the tub in front of the toilet.  There is a smaller bathroom where he would only fall against the wall.

## 2023-06-29 NOTE — Assessment & Plan Note (Signed)
Interestingly, his orthostatic pressures are much higher than his baseline pressure.  I am fine as long as his blood pressures are below 180 systolic for every.  Would need to slow his heart rate down further and avoid orthostatic hypotension, will reduce Altace to total of 5 mg daily from 7.5 and increase Toprol to 50 mg daily.

## 2023-06-29 NOTE — Assessment & Plan Note (Signed)
We talked about goals of care counseling.  Briefly discussed the topic of CODE STATUS.  Also discussed the issue with him continuing to drive.  While it may be safe for him to drive short distances in a local community, I am very fearful of being on roads.  I tend to agree with his daughter that it is probably best that he does not drive.  Unfortunately this would take away his independence which is a very major concern of his.  I do not feel like this is a decision that needs to make correct this time, but it is something that certainly needs to be taken to consideration going forward. Charles Sellers is a vehemently independent gentleman who seems to be very stubbornly sticking to his freedoms.  He does not seem to be as worried about issues.  I tried to bring his attention to the thought that he could potentially fall asleep while driving and toss the life of somebody who has not lived 89 years.  This seemed to have a more profound close on him.  Unable to get under advisement.

## 2023-06-29 NOTE — Assessment & Plan Note (Signed)
Despite having elevated pressures on echocardiogram, he is not having any real symptoms of exertional dyspnea.  Only mild edema that was controlled with very low-dose of Lasix 10 mg maybe 2 days a week. On ACE inhibitor for echo reduction along with beta-blocker to increase the dose.

## 2023-06-29 NOTE — Assessment & Plan Note (Signed)
Trivial on previous echo.

## 2023-06-29 NOTE — Assessment & Plan Note (Signed)
A-fib rate is a little high still.  I think for now because of his dizziness will reduce his ACE inhibitor dose down to 5 mg which would allow [place] to increase his Toprol to 50 mg.  This will only prove his heart rate.  Need to monitor for bradycardia.  We had a long discussion about all of his falls and concerns for potential significant bleed on Eliquis.  We decided that until the cause of his falls and frequency has reduced, with prefer for him to not be on Eliquis avoid potential head bleed since he has had at least 2 falls where he has hit his head.  Plan: Increase Toprol to 50 mg daily, stop Eliquis, reduce Altace to 5 mg daily.

## 2023-06-29 NOTE — Assessment & Plan Note (Signed)
I am little concerned about his hypersomnolence.  I am concerned about Imp[place] sleep with driving as well.  This is a major area of concern for them as it is leading to falls. I do not know if this is related to sleep hygiene or being potentially OSA.  I will refer him to Dr. Vickey Huger from neurology sleep medicine.

## 2023-06-29 NOTE — Assessment & Plan Note (Signed)
Clearly elevated CHA2DS2-VASc score of 5, however he has has bled score is also very high with multiple frequent falls and head contusions, I think we need to be safe.  We talked about options understanding there is a risk of stroke versus head bleed.  With him having all these falls until they are somewhat resolved I think the best thing to do is for him to stop Eliquis.  If doing okay after month or so without any follow-up to consider adding 81 mg aspirin.

## 2023-07-22 ENCOUNTER — Ambulatory Visit (INDEPENDENT_AMBULATORY_CARE_PROVIDER_SITE_OTHER): Payer: HMO | Admitting: Podiatry

## 2023-07-22 ENCOUNTER — Encounter: Payer: Self-pay | Admitting: Podiatry

## 2023-07-22 DIAGNOSIS — B353 Tinea pedis: Secondary | ICD-10-CM

## 2023-07-22 DIAGNOSIS — E1122 Type 2 diabetes mellitus with diabetic chronic kidney disease: Secondary | ICD-10-CM

## 2023-07-22 DIAGNOSIS — M79674 Pain in right toe(s): Secondary | ICD-10-CM

## 2023-07-22 DIAGNOSIS — N181 Chronic kidney disease, stage 1: Secondary | ICD-10-CM | POA: Diagnosis not present

## 2023-07-22 DIAGNOSIS — B351 Tinea unguium: Secondary | ICD-10-CM | POA: Diagnosis not present

## 2023-07-22 DIAGNOSIS — M79675 Pain in left toe(s): Secondary | ICD-10-CM | POA: Diagnosis not present

## 2023-07-22 MED ORDER — KETOCONAZOLE 2 % EX CREA
TOPICAL_CREAM | CUTANEOUS | 1 refills | Status: DC
Start: 2023-07-22 — End: 2023-08-13

## 2023-07-22 NOTE — Progress Notes (Signed)
  Subjective:  Patient ID: Charles Arabia., male    DOB: 04-23-1932,  MRN: [id number]  Charles Arabia. presents to clinic today for: preventative diabetic foot care and painful elongated mycotic toenails 1-5 bilaterally which are tender when wearing enclosed shoe gear. Pain is relieved with periodic professional debridement.  Chief Complaint  Patient presents with   Tinea Pedis   Nail Problem   Diabetes    Caregiver relates patient has developed rash on both feet. He has been wearing shoes without socks. Patient denies any itching or pain, blisters or open wounds.    PCP is [name], PA-C.  Allergies  Allergen Reactions   Niaspan [Niacin Er] Rash   Penicillins Rash    Review of Systems: Negative except as noted in the HPI.  Objective: No changes noted in today's physical examination. There were no vitals filed for this visit.  Charles Arabia. is a pleasant 89 y.o. male in NAD. AAO x 3.  Vascular Examination: Capillary refill time <3 seconds b/l LE. Palpable pedal pulses b/l LE. Digital hair absent b/l. Trace edema noted BLE. No cyanosis or clubbing noted b/l LE.Marland Kitchen  Dermatological Examination: Pedal skin with normal turgor, texture and tone b/l. No open wounds. No interdigital macerations b/l. Toenails 1-5 b/l thickened, discolored, dystrophic with subungual debris. There is pain on palpation to dorsal aspect of nailplates. Skin eruption noted plantar and peripheral aspect of left foot and peripheral aspect of right foot. No erythema, no edema, no weeping, no fluctuance..            Neurological Examination: Protective sensation intact with 10 gram monofilament b/l LE.   Musculoskeletal Examination: Muscle strength 5/5 to all lower extremity muscle groups bilaterally. No pain, crepitus or joint limitation noted with ROM bilateral LE. No gross bony deformities bilaterally. Utilizes cane for ambulation assistance.     Latest Ref Rng & Units 12/24/2022   11:25  AM 08/14/2022   12:00 PM  Hemoglobin A1C  Hemoglobin-A1c 4.8 - 5.6 % 7.0  6.3    Assessment/Plan: 1. Pain due to onychomycosis of toenails of both feet   2. Tinea pedis of both feet   3. 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    Dispense:  60 g    Refill:  1   -Caregiver/provider present with patient on today's visit. -Consent given for treatment as described below: -Examined patient. -Encouraged patient to wear white breathable socks with shoes daily. -Patient to continue soft, supportive shoe gear daily. -Patient instructed to spray shoes with Lysol disinfectant every evening. Patient instructed to clean bathtub/shower with bleach based cleanser to avoid reinfection or spreading athlete's feet to others. -Mycotic toenails 1-5 bilaterally were debrided in length and girth with sterile nail nippers and dremel without incident. -For tinea pedis, Rx sent to pharmacy for Ketoconazole Cream 2% to be applied once daily for six weeks. -Patient/POA to call should there be question/concern in the interim.   Return in about 1 month (around 08/22/2023) with Dr. Nicholes Rough for follow up tinea pedis.  Freddie Breech, DPM

## 2023-07-22 NOTE — Patient Instructions (Signed)
 To prevent reinfection, spray shoes with lysol every evening.  Clean tub or shower with bleach based cleanser.  Athlete's Foot Athlete's foot (tinea pedis) is a fungal infection of the skin on your feet. It often occurs on the skin that is between or underneath the toes. It can also occur on the soles of your feet. The infection can spread from person to person (is contagious). It can also spread when a person's bare feet come in contact with the fungus on shower floors or on items such as shoes. What are the causes? This condition is caused by a fungus that grows in warm, moist places. You can get athlete's foot by sharing shoes, shower stalls, towels, and wet floors with someone who is infected. Not washing your feet or changing your socks often enough can also lead to athlete's foot. What increases the risk? This condition is more likely to develop in: Men. People who have a weak body defense system (immune system). People who have diabetes. People who use public showers, such as at a gym. People who wear heavy-duty shoes, such as industrial or military shoes. Seasons with warm, humid weather. What are the signs or symptoms? Symptoms of this condition include: Itchy areas between your toes or on the soles of your feet. White, flaky, or scaly areas between your toes or on the soles of your feet. Very itchy small blisters between your toes or on the soles of your feet. Small cuts in your skin. These cuts can become infected. Thick or discolored toenails. How is this diagnosed? This condition may be diagnosed with a physical exam and a review of your medical history. Your health care provider may also take a skin or toenail sample to examine under a microscope. How is this treated? This condition is treated with antifungal medicines. These may be applied as powders, ointments, or creams. In severe cases, an oral antifungal medicine may be given. Follow these instructions at  home: Medicines Apply or take over-the-counter and prescription medicines only as told by your health care provider. Apply your antifungal medicine as told by your health care provider. Do not stop using the antifungal even if your condition improves. Foot care Do not scratch your feet. Keep your feet dry: Wear cotton or wool socks. Change your socks every day or if they become wet. Wear shoes that allow air to flow, such as sandals or canvas tennis shoes. Wash and dry your feet, including the area between your toes. Also, wash and dry your feet: Every day or as told by your health care provider. After exercising. General instructions Do not let others use towels, shoes, nail clippers, or other personal items that touch your feet. Protect your feet by wearing sandals in wet areas, such as locker rooms and shared showers. Keep all follow-up visits. This is important. If you have diabetes, keep your blood sugar under control. Contact a health care provider if: You have a fever. You have swelling, soreness, warmth, or redness in your foot. Your feet are not getting better with treatment. Your symptoms get worse. You have new symptoms. You have severe pain. Summary Athlete's foot (tinea pedis) is a fungal infection of the skin on your feet. It often occurs on skin that is between or underneath the toes. This condition is caused by a fungus that grows in warm, moist places. Symptoms include white, flaky, or scaly areas between your toes or on the soles of your feet. This condition is treated with antifungal medicines.   Keep your feet clean. Always dry them thoroughly. This information is not intended to replace advice given to you by your health care provider. Make sure you discuss any questions you have with your health care provider. Document Revised: 03/19/2021 Document Reviewed: 03/19/2021 [employer] Patient Education  2024 [employer]  

## 2023-07-24 DIAGNOSIS — R54 Age-related physical debility: Secondary | ICD-10-CM | POA: Diagnosis not present

## 2023-07-24 DIAGNOSIS — R059 Cough, unspecified: Secondary | ICD-10-CM | POA: Diagnosis not present

## 2023-07-24 DIAGNOSIS — Z7984 Long term (current) use of oral hypoglycemic drugs: Secondary | ICD-10-CM | POA: Diagnosis not present

## 2023-07-24 DIAGNOSIS — R197 Diarrhea, unspecified: Secondary | ICD-10-CM | POA: Diagnosis not present

## 2023-07-24 DIAGNOSIS — I4891 Unspecified atrial fibrillation: Secondary | ICD-10-CM | POA: Diagnosis not present

## 2023-07-24 DIAGNOSIS — K8020 Calculus of gallbladder without cholecystitis without obstruction: Secondary | ICD-10-CM | POA: Diagnosis not present

## 2023-07-24 DIAGNOSIS — J90 Pleural effusion, not elsewhere classified: Secondary | ICD-10-CM | POA: Diagnosis not present

## 2023-07-24 DIAGNOSIS — I10 Essential (primary) hypertension: Secondary | ICD-10-CM | POA: Diagnosis not present

## 2023-07-24 DIAGNOSIS — Z880 Allergy status to penicillin: Secondary | ICD-10-CM | POA: Diagnosis not present

## 2023-07-24 DIAGNOSIS — R918 Other nonspecific abnormal finding of lung field: Secondary | ICD-10-CM | POA: Diagnosis not present

## 2023-07-24 DIAGNOSIS — I2129 ST elevation (STEMI) myocardial infarction involving other sites: Secondary | ICD-10-CM | POA: Diagnosis not present

## 2023-07-24 DIAGNOSIS — A481 Legionnaires' disease: Secondary | ICD-10-CM | POA: Diagnosis not present

## 2023-07-24 DIAGNOSIS — J9811 Atelectasis: Secondary | ICD-10-CM | POA: Diagnosis not present

## 2023-07-24 DIAGNOSIS — J811 Chronic pulmonary edema: Secondary | ICD-10-CM | POA: Diagnosis not present

## 2023-07-24 DIAGNOSIS — Z7901 Long term (current) use of anticoagulants: Secondary | ICD-10-CM | POA: Diagnosis not present

## 2023-07-24 DIAGNOSIS — E119 Type 2 diabetes mellitus without complications: Secondary | ICD-10-CM | POA: Diagnosis not present

## 2023-07-24 DIAGNOSIS — R0902 Hypoxemia: Secondary | ICD-10-CM | POA: Diagnosis not present

## 2023-07-24 DIAGNOSIS — K219 Gastro-esophageal reflux disease without esophagitis: Secondary | ICD-10-CM | POA: Diagnosis not present

## 2023-07-24 DIAGNOSIS — K828 Other specified diseases of gallbladder: Secondary | ICD-10-CM | POA: Diagnosis not present

## 2023-07-24 DIAGNOSIS — I2510 Atherosclerotic heart disease of native coronary artery without angina pectoris: Secondary | ICD-10-CM | POA: Diagnosis not present

## 2023-07-24 DIAGNOSIS — Z1152 Encounter for screening for COVID-19: Secondary | ICD-10-CM | POA: Diagnosis not present

## 2023-07-24 DIAGNOSIS — Z79899 Other long term (current) drug therapy: Secondary | ICD-10-CM | POA: Diagnosis not present

## 2023-07-24 DIAGNOSIS — J189 Pneumonia, unspecified organism: Secondary | ICD-10-CM | POA: Diagnosis not present

## 2023-07-24 DIAGNOSIS — R7989 Other specified abnormal findings of blood chemistry: Secondary | ICD-10-CM | POA: Diagnosis not present

## 2023-07-24 DIAGNOSIS — K449 Diaphragmatic hernia without obstruction or gangrene: Secondary | ICD-10-CM | POA: Diagnosis not present

## 2023-07-24 DIAGNOSIS — E871 Hypo-osmolality and hyponatremia: Secondary | ICD-10-CM | POA: Diagnosis not present

## 2023-07-24 DIAGNOSIS — R464 Slowness and poor responsiveness: Secondary | ICD-10-CM | POA: Diagnosis not present

## 2023-07-24 DIAGNOSIS — I462 Cardiac arrest due to underlying cardiac condition: Secondary | ICD-10-CM | POA: Diagnosis not present

## 2023-07-24 DIAGNOSIS — Z955 Presence of coronary angioplasty implant and graft: Secondary | ICD-10-CM | POA: Diagnosis not present

## 2023-07-24 DIAGNOSIS — I4819 Other persistent atrial fibrillation: Secondary | ICD-10-CM | POA: Diagnosis not present

## 2023-07-24 DIAGNOSIS — E785 Hyperlipidemia, unspecified: Secondary | ICD-10-CM | POA: Diagnosis not present

## 2023-07-24 DIAGNOSIS — N400 Enlarged prostate without lower urinary tract symptoms: Secondary | ICD-10-CM | POA: Diagnosis not present

## 2023-07-24 DIAGNOSIS — R630 Anorexia: Secondary | ICD-10-CM | POA: Diagnosis not present

## 2023-08-06 ENCOUNTER — Ambulatory Visit: Payer: HMO | Admitting: Family Medicine

## 2023-08-11 DEATH — deceased

## 2023-08-22 ENCOUNTER — Ambulatory Visit: Payer: HMO | Admitting: Podiatry

## 2023-09-05 ENCOUNTER — Ambulatory Visit: Payer: HMO | Admitting: Cardiology

## 2023-10-28 ENCOUNTER — Ambulatory Visit: Payer: HMO | Admitting: Podiatry
